# Patient Record
Sex: Female | Born: 1940 | Race: White | Hispanic: No | Marital: Married | Smoking: Never smoker
Health system: Southern US, Community
[De-identification: ages and names within clinical notes are randomized; demographics above are authoritative.]

## PROBLEM LIST (undated history)

## (undated) DIAGNOSIS — F419 Anxiety disorder, unspecified: Secondary | ICD-10-CM

## (undated) DIAGNOSIS — S82891A Other fracture of right lower leg, initial encounter for closed fracture: Secondary | ICD-10-CM

## (undated) DIAGNOSIS — F32A Depression, unspecified: Secondary | ICD-10-CM

## (undated) DIAGNOSIS — I509 Heart failure, unspecified: Secondary | ICD-10-CM

## (undated) DIAGNOSIS — A4902 Methicillin resistant Staphylococcus aureus infection, unspecified site: Secondary | ICD-10-CM

## (undated) DIAGNOSIS — I10 Essential (primary) hypertension: Secondary | ICD-10-CM

## (undated) DIAGNOSIS — N3281 Overactive bladder: Secondary | ICD-10-CM

## (undated) DIAGNOSIS — F329 Major depressive disorder, single episode, unspecified: Secondary | ICD-10-CM

## (undated) DIAGNOSIS — K219 Gastro-esophageal reflux disease without esophagitis: Secondary | ICD-10-CM

## (undated) HISTORY — PX: APPENDECTOMY: SHX54

## (undated) HISTORY — PX: NASAL SINUS SURGERY: SHX719

## (undated) HISTORY — PX: ABDOMINAL HYSTERECTOMY: SHX81

---
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
## 2005-09-28 ENCOUNTER — Emergency Department (HOSPITAL_COMMUNITY): Admission: EM | Admit: 2005-09-28 | Discharge: 2005-09-28 | Payer: Self-pay | Admitting: Emergency Medicine

## 2007-08-25 ENCOUNTER — Emergency Department (HOSPITAL_COMMUNITY): Admission: EM | Admit: 2007-08-25 | Discharge: 2007-08-25 | Payer: Self-pay | Admitting: Emergency Medicine

## 2008-03-16 ENCOUNTER — Ambulatory Visit (HOSPITAL_COMMUNITY): Admission: RE | Admit: 2008-03-16 | Discharge: 2008-03-16 | Payer: Self-pay | Admitting: Ophthalmology

## 2009-11-10 ENCOUNTER — Encounter: Payer: Self-pay | Admitting: Orthopedic Surgery

## 2009-11-10 ENCOUNTER — Emergency Department (HOSPITAL_COMMUNITY): Admission: EM | Admit: 2009-11-10 | Discharge: 2009-11-10 | Payer: Self-pay | Admitting: Emergency Medicine

## 2009-11-12 ENCOUNTER — Ambulatory Visit: Payer: Self-pay | Admitting: Orthopedic Surgery

## 2009-11-12 DIAGNOSIS — S52539A Colles' fracture of unspecified radius, initial encounter for closed fracture: Secondary | ICD-10-CM

## 2009-11-16 ENCOUNTER — Telehealth: Payer: Self-pay | Admitting: Orthopedic Surgery

## 2009-11-20 ENCOUNTER — Ambulatory Visit: Payer: Self-pay | Admitting: Orthopedic Surgery

## 2009-12-24 ENCOUNTER — Ambulatory Visit: Payer: Self-pay | Admitting: Orthopedic Surgery

## 2009-12-25 ENCOUNTER — Telehealth: Payer: Self-pay | Admitting: Orthopedic Surgery

## 2009-12-28 ENCOUNTER — Encounter: Admission: RE | Admit: 2009-12-28 | Discharge: 2009-12-28 | Payer: Self-pay | Admitting: Orthopedic Surgery

## 2009-12-31 ENCOUNTER — Encounter: Payer: Self-pay | Admitting: Orthopedic Surgery

## 2010-01-05 ENCOUNTER — Ambulatory Visit (HOSPITAL_COMMUNITY): Admission: RE | Admit: 2010-01-05 | Discharge: 2010-01-06 | Payer: Self-pay | Admitting: Orthopedic Surgery

## 2010-07-09 NOTE — Assessment & Plan Note (Signed)
Summary: RE-CK LT WRIST/FX CARE/?ARM CROOKED/STATES NO NEW INJURY/MEDI...   Visit Type:  Follow-up Referring Provider:  ap er Primary Provider:  Dr. Gerda Diss  CC:  recheck left wrist fx care.  History of Present Illness: I saw Jalaila Fomby in the office today for an initial visit.  She is a 70 years old woman with the complaint of:  left wrist fracture 11/10/09 DOI.  She fell wearing his both feet went out from under her and she fractured her LEFT wrist the impaction fracture with really no displacement but articular depression  She complains of sharp throbbing stabbing burning pain which is 10 out of 10 and it is constant and unrelieved by Percocet 5 mg and ibuprofen 800 mg.  She does have some tingling and some swelling.  Meds: Metoprolol, Nexium, Effexor, Premarin, HCTZ, Norco 7.5 and Phenergan.  Today is in for an early appt due to "her arm being crooked"  Patient in cast today.  Says her cast is crooked, says she is having alot of pain, Percocet 5 did not help and Norco 7.5 does not help.  Has multiple questions, wants to know if she needs therapy, can she use her hand, can she lift anything, can we xray through the cast, etc.  Mainly having alot of pain, wants higher dosage of medication         Allergies: No Known Drug Allergies   Impression & Recommendations:  Problem # 1:  COLLES' FRACTURE, LEFT (ICD-813.41) Assessment Comment Only  I tried to explain to her about 3. bending she essentially has a nondisplaced fracture with some vertical impaction at the joint surface.  I did increase her hydrocodone to 10 mg.  Return as scheduled x-rays as scheduled  Orders: Post-Op Check (78295)  Medications Added to Medication List This Visit: 1)  Norco 10-325 Mg Tabs (Hydrocodone-acetaminophen) .... One by mouth q 4 hrs as needed pain  Patient Instructions: 1)  keep appt Prescriptions: NORCO 10-325 MG TABS (HYDROCODONE-ACETAMINOPHEN) one by mouth q 4 hrs as needed  pain  #42 x 1   Entered and Authorized by:   Fuller Canada MD   Signed by:   Fuller Canada MD on 11/20/2009   Method used:   Print then Give to Patient   RxID:   [id number]

## 2010-07-09 NOTE — Progress Notes (Signed)
Summary: pts daughter called being ugly to office staff  Phone Note Other Incoming   Summary of Call: pateint's daughter called and was very ugly because patient does not have appt set up today to see DrMarland Kitchen Amanda Pea, advised her that we are working on referral, she said that her mother is old and should have appt today to see the specialist and she hung up on me.  Her referral has been faxed, waiting on Gram[facility] response Initial call taken by: Chasity Tereasa Coop,  December 25, 2009 10:12 AM

## 2010-07-09 NOTE — Progress Notes (Signed)
Summary: arm crooked?!  Phone Note Outgoing Call Call back at [facility] [phone]   Summary of Call: called patient back, she had called stating her arm is crooked per flag from [facility], I left message on machine to call me before 12 if possible, I advised her on the machine to go to er if she had fallen again to make her arm crooked. Initial call taken by: Ether Griffins,  November 16, 2009 9:12 AM  Follow-up for Phone Call        I dont understand this message ???? Follow-up by: Fuller Canada MD,  November 19, 2009 8:39 AM  Additional Follow-up for Phone Call Additional follow up Details #1::        I called patient to follow up; states arm seems okay; it was her daughter that had called w/concern; I've sched'd for appointment for re-check. Additional Follow-up by: Cammie Sickle,  November 19, 2009 10:02 AM

## 2010-07-09 NOTE — Letter (Signed)
Summary: History form  History form   Imported By: Jacklynn Ganong 11/15/2009 15:31:32  _____________________________________________________________________  External Attachment:    Type:   Image     Comment:   External Document

## 2010-07-09 NOTE — Assessment & Plan Note (Signed)
Summary: [facility] ER FX LEFT WRIST XR THERE/MEDICARE/BSF   Visit Type:  new patient Referring Provider:  ap er Primary Provider:  Dr. Gerda Diss  CC:  left wrist.  History of Present Illness: I saw Kari Young in the office today for an initial visit.  She is a 70 years old woman with the complaint of:  left wrist fracture.  She fell wearing his both feet went out from under her and she fractured her LEFT wrist the impaction fracture with really no displacement but articular depression  She complains of sharp throbbing stabbing burning pain which is 10 out of 10 and it is constant and unrelieved by Percocet 5 mg and ibuprofen 800 mg.  She does have some tingling and some swelling.  Meds: Metoprolol, Nexium, Effexor, Premarin, HCTZ, Percocet 5 given from er, no relief.      Allergies (verified): No Known Drug Allergies  Past History:  Past Medical History: htn reflux anxiety  Past Surgical History: na  Family History: na  Social History: Patient is married.  retired no smoking no alcohol no caffeine  Review of Systems Gastrointestinal:  Complains of nausea; denies heartburn, vomiting, diarrhea, constipation, and blood in your stools. Musculoskeletal:  Complains of joint pain and swelling; denies instability, stiffness, redness, heat, and muscle pain.  The review of systems is negative for Constitutional, Cardiovascular, Respiratory, Genitourinary, Neurologic, Endocrine, Psychiatric, Skin, HEENT, Immunology, and Hemoatologic.  Physical Exam  Additional Exam:  Constitutional: vital signs see recorded values. General: normal development, nutrition, and grooming. No deformity. Body Habitus is medium. CDV: Observation and palpation was normal  Lymph: palpation of the lymph nodes were normal Skin: inspection and palpation of the skin revealed no abnormalities  Neuro: coordination: deferred             DTR's deferred because of pain and swelling             Sensation was  normal  Psyche: Alert and oriented x 3. Mood was normal.  Affect: normal  MSK: Gait: normal   LEFT upper extremity exam tenderness and swelling of the distal radius with normal function of the fingers.  Range of motion painful.  Motor exam shows no contracture or tremor.  Wrist is stable.       Impression & Recommendations:  Problem # 1:  COLLES' FRACTURE, LEFT (ICD-813.41) Assessment New  The x-rays were done at [facility]. The report and the films have been reviewed. There is an impaction fracture with some comminution but overall alignment is normal there is mild minimal shortening minimal change in the articular surface angulation.  Short-arm cast 6 weeks change Percocet and try Norco 7.5 with Phenergan 25 and ibuprofen 800  Orders: New Patient Level III (16109) Distal Radius Fx (25600) the patient is advised to get a test for osteoporosis and she is advised she is at increased risk for hip fracture  Medications Added to Medication List This Visit: 1)  Norco 7.5-325 Mg Tabs (Hydrocodone-acetaminophen) .Marland Kitchen.. 1 q 4 as needed pain 2)  Promethazine Hcl 25 Mg Tabs (Promethazine hcl) .Marland Kitchen.. 1 q 4 as needed nausea  Patient Instructions: 1)  elevate your arm  2)  apply ice if there is swelling  3)  do not put anything in your cast to scratch  4)  Please do not get the cast wet. It will casue a severe skin reaction. If you do get it wet, dry it with a hairdryer on a low setting and call the office. [the cast will  need to be changed] 5)  6 weeks xrays OOP Prescriptions: PROMETHAZINE HCL 25 MG TABS (PROMETHAZINE HCL) 1 q 4 as needed nausea  #84 x 2   Entered and Authorized by:   Fuller Canada MD   Signed by:   Fuller Canada MD on 11/12/2009   Method used:   Print then Give to Patient   RxID:   [id number] NORCO 7.5-325 MG TABS (HYDROCODONE-ACETAMINOPHEN) 1 q 4 as needed pain  #84 x 2   Entered and Authorized by:   Fuller Canada MD   Signed by:   Fuller Canada MD on  11/12/2009   Method used:   Print then Give to Patient   RxID:   [id number]

## 2010-07-09 NOTE — Consult Note (Signed)
Summary: Consult note from Dr. Amanda Pea  Consult note from Dr. Amanda Pea   Imported By: Jacklynn Ganong 01/10/2010 08:06:44  _____________________________________________________________________  External Attachment:    Type:   Image     Comment:   External Document

## 2010-07-09 NOTE — Assessment & Plan Note (Signed)
Summary: 6 wk RE-CK/XRAYS LT WRIST/FX CARE/MEDICARE/CAF   Visit Type:  Follow-up Referring Provider:  ap er Primary Provider:  Dr. Gerda Diss  CC:  recheck fx care wrist.  History of Present Illness: I saw Kari Young in the office today for an initial visit.  She is a 70 years old woman with the complaint of:  left wrist fracture 11/10/09 DOI.  She fell wearing his both feet went out from under her and she fractured her LEFT wrist the impaction fracture with really no displacement but articular depression  Meds: Metoprolol, Nexium, Effexor, Premarin, HCTZ, Norco 10 and Phenergan.  Today is a 6 week recheck, xray left wrist OOP.  She still has pain, Norco 10 helps, has 4 left, needs refill.  I reexamined the x-rays today and took an extra x-ray and she has had  significant collapse of the fracture and will require open reduction,  distraction with bone graft and plating.  I will be  sending her to hand specialist for that.           Current Medications (verified): 1)  Norco 10-325 Mg Tabs (Hydrocodone-Acetaminophen) .... One By Mouth Q 4 Hrs As Needed Pain 2)  Promethazine Hcl 25 Mg Tabs (Promethazine Hcl) .Marland Kitchen.. 1 Q 4 As Needed Nausea  Allergies (verified): No Known Drug Allergies  Past History:  Past Medical History: Last updated: 11/12/2009 htn reflux anxiety  Past Surgical History: Last updated: 11/12/2009 na  Family History: Last updated: 11/12/2009 na  Social History: Last updated: 11/12/2009 Patient is married.  retired no smoking no alcohol no caffeine  Physical Exam  Additional Exam:  initial evaluation  Constitutional: vital signs see recorded values. General: normal development, nutrition, and grooming. No deformity. Body Habitus is medium. CDV: Observation and palpation was normal  Lymph: palpation of the lymph nodes were normal Skin: inspection and palpation of the skin revealed no abnormalities  Neuro: coordination: deferred   DTR's deferred because of pain and swelling             Sensation was normal  Psyche: Alert and oriented x 3. Mood was normal.  Affect: normal  MSK: Gait: normal   LEFT upper extremity exam tenderness and swelling of the distal radius with normal function of the fingers.  Range of motion painful.  Motor exam shows no contracture or tremor.  Wrist is stable.  Today's evaluation shows that she has a prominent ulna tenderness loss of supination with painful supination and tenderness at the fracture site  Elbow shoulder nontender no pain or swelling       Impression & Recommendations:  Problem # 1:  COLLES' FRACTURE, LEFT (ICD-813.41)  loss of position of Colles' fracture recommend referral to a hand specialist  Orders: Post-Op Check (42706) Wrist x-ray complete, minimum 3 views (23762) Orthopedic Surgeon Referral (Ortho Surgeon)  Patient Instructions: 1)  referral to Dr. Butler Denmark  for open treatment internal fixation bone grafting of LEFT distal radius fracture secondary to collapse and loss of position of Colles fracture  Prescriptions: NORCO 10-325 MG TABS (HYDROCODONE-ACETAMINOPHEN) one by mouth q 4 hrs as needed pain  #42 x 1   Entered and Authorized by:   Fuller Canada MD   Signed by:   Fuller Canada MD on 12/24/2009   Method used:   Print then Give to Patient   RxID:   [id number]

## 2010-07-09 NOTE — Progress Notes (Signed)
Summary: Referral to Dr. Butler Denmark.  Phone Note Outgoing Call   Call placed by: Waldon Reining,  December 25, 2009 10:16 AM Call placed to: Specialist Action Taken: Information Sent Summary of Call: I faxed a referral for this patient to Dr. Amanda Pea for a wrist fracture that needs surgery. Patient has films.

## 2010-08-24 LAB — DIFFERENTIAL
Basophils Absolute: 0 10*3/uL (ref 0.0–0.1)
Basophils Relative: 0 % (ref 0–1)
Eosinophils Relative: 1 % (ref 0–5)
Lymphs Abs: 1.1 10*3/uL (ref 0.7–4.0)
Monocytes Absolute: 0.5 10*3/uL (ref 0.1–1.0)
Neutrophils Relative %: 76 % (ref 43–77)

## 2010-08-24 LAB — SURGICAL PCR SCREEN: Staphylococcus aureus: NEGATIVE

## 2010-08-24 LAB — COMPREHENSIVE METABOLIC PANEL
ALT: 18 U/L (ref 0–35)
Alkaline Phosphatase: 47 U/L (ref 39–117)
BUN: 11 mg/dL (ref 6–23)
CO2: 33 mEq/L — ABNORMAL HIGH (ref 19–32)
Calcium: 9.4 mg/dL (ref 8.4–10.5)
Chloride: 93 mEq/L — ABNORMAL LOW (ref 96–112)
Creatinine, Ser: 0.71 mg/dL (ref 0.4–1.2)
GFR calc Af Amer: >60 mL/min (ref >60)
GFR calc non Af Amer: >60 mL/min (ref >60)
Glucose, Bld: 101 mg/dL — ABNORMAL HIGH (ref 70–99)
Total Bilirubin: 0.6 mg/dL (ref 0.3–1.2)
Total Protein: 7.7 g/dL (ref 6.0–8.3)

## 2010-08-24 LAB — CBC
MCH: 33.7 pg (ref 26.0–34.0)
MCHC: 34.9 g/dL (ref 30.0–36.0)
MCV: 96.6 fL (ref 78.0–100.0)
RBC: 4.64 MIL/uL (ref 3.87–5.11)

## 2011-03-03 LAB — COMPREHENSIVE METABOLIC PANEL
ALT: 16
AST: 20
Albumin: 3.7
Alkaline Phosphatase: 53
BUN: 11
CO2: 27
Calcium: 9.1
Chloride: 103
Creatinine, Ser: 0.63
GFR calc Af Amer: >60
GFR calc non Af Amer: >60
Glucose, Bld: 90
Potassium: 4.6
Sodium: 138
Total Bilirubin: 0.7
Total Protein: 6.5

## 2011-03-03 LAB — POCT CARDIAC MARKERS
CKMB, poc: <1 — ABNORMAL LOW
Myoglobin, poc: 41.2
Operator id: 146091
Troponin i, poc: <0.05

## 2011-03-03 LAB — URINALYSIS, ROUTINE W REFLEX MICROSCOPIC
Bilirubin Urine: NEGATIVE
Glucose, UA: NEGATIVE
Hgb urine dipstick: NEGATIVE
Ketones, ur: NEGATIVE
Nitrite: NEGATIVE
Protein, ur: NEGATIVE
Specific Gravity, Urine: 1.02
Urobilinogen, UA: 0.2
pH: 6.5

## 2011-03-03 LAB — DIFFERENTIAL
Basophils Absolute: 0
Basophils Relative: 0
Eosinophils Absolute: 0.1
Eosinophils Relative: 1
Lymphocytes Relative: 30
Lymphs Abs: 2.2
Monocytes Absolute: 0.4
Monocytes Relative: 5
Neutro Abs: 4.6
Neutrophils Relative %: 63

## 2011-03-03 LAB — CBC
HCT: 40.6
Hemoglobin: 13.9
MCHC: 34.2
MCV: 94.6
Platelets: 130 — ABNORMAL LOW
RBC: 4.29
RDW: 13.6
WBC: 7.2

## 2011-03-03 LAB — LIPASE, BLOOD: Lipase: 25

## 2011-03-11 LAB — BASIC METABOLIC PANEL
Calcium: 8.9
Chloride: 102
GFR calc Af Amer: >60
Glucose, Bld: 102 — ABNORMAL HIGH
Potassium: 4.4

## 2011-03-11 LAB — HEMOGLOBIN AND HEMATOCRIT, BLOOD: HCT: 41.7

## 2011-09-05 ENCOUNTER — Emergency Department (HOSPITAL_COMMUNITY)
Admission: EM | Admit: 2011-09-05 | Discharge: 2011-09-05 | Disposition: A | Discharge status: Home / Self Care | Payer: Medicare Other | Attending: Emergency Medicine | Admitting: Emergency Medicine

## 2011-09-05 ENCOUNTER — Emergency Department (HOSPITAL_COMMUNITY): Payer: Medicare Other

## 2011-09-05 ENCOUNTER — Encounter (HOSPITAL_COMMUNITY): Payer: Self-pay | Admitting: *Deleted

## 2011-09-05 DIAGNOSIS — R059 Cough, unspecified: Secondary | ICD-10-CM | POA: Insufficient documentation

## 2011-09-05 DIAGNOSIS — R079 Chest pain, unspecified: Secondary | ICD-10-CM | POA: Insufficient documentation

## 2011-09-05 DIAGNOSIS — D72829 Elevated white blood cell count, unspecified: Secondary | ICD-10-CM | POA: Insufficient documentation

## 2011-09-05 DIAGNOSIS — Z79899 Other long term (current) drug therapy: Secondary | ICD-10-CM | POA: Insufficient documentation

## 2011-09-05 DIAGNOSIS — R05 Cough: Secondary | ICD-10-CM | POA: Insufficient documentation

## 2011-09-05 HISTORY — DX: Methicillin resistant Staphylococcus aureus infection, unspecified site: A49.02

## 2011-09-05 HISTORY — DX: Essential (primary) hypertension: I10

## 2011-09-05 LAB — BASIC METABOLIC PANEL
BUN: 16 mg/dL (ref 6–23)
CO2: 30 mEq/L (ref 19–32)
Calcium: 10 mg/dL (ref 8.4–10.5)
Creatinine, Ser: 0.58 mg/dL (ref 0.50–1.10)
GFR calc Af Amer: >90 mL/min (ref >90)
GFR calc non Af Amer: >90 mL/min (ref >90)
Glucose, Bld: 87 mg/dL (ref 70–99)
Potassium: 4.7 mEq/L (ref 3.5–5.1)
Sodium: 139 mEq/L (ref 135–145)

## 2011-09-05 LAB — CBC
Hemoglobin: 15.6 g/dL — ABNORMAL HIGH (ref 12.0–15.0)
MCHC: 35.4 g/dL (ref 30.0–36.0)
MCV: 92.3 fL (ref 78.0–100.0)

## 2011-09-05 MED ORDER — HYDROCOD POLST-CHLORPHEN POLST 10-8 MG/5ML PO LQCR: 5.0000 mL | Freq: Two times a day (BID) | ORAL | Status: DC | PRN

## 2011-09-05 MED ORDER — MOXIFLOXACIN HCL 400 MG PO TABS: 400.0000 mg | ORAL_TABLET | Freq: Every day | ORAL | Status: AC

## 2011-09-05 NOTE — Discharge Instructions (Signed)
Cool Mist Vaporizers Vaporizers may help relieve the symptoms of a cough and cold. By adding water to the air, mucus may become thinner and less sticky. This makes it easier to breathe and cough up secretions. Vaporizers have not been proven to show they help with colds. You should not use a vaporizer if you are allergic to mold. Cool mist vaporizers do not cause serious burns like hot mist vaporizers ("steamers"). HOME CARE INSTRUCTIONS  Follow the package instructions for your vaporizer.   Use a vaporizer that holds a large volume of water (1 to 2 gallons [5.7 to 7.5 liters]).   Do not use anything other than distilled water in the vaporizer.   Do not run the vaporizer all of the time. This can cause mold or bacteria to grow in the vaporizer.   Clean the vaporizer after each time you use it.   Clean and dry the vaporizer well before you store it.   Stop using a vaporizer if you develop worsening respiratory symptoms.  Document Released: 02/21/2004 Document Revised: 05/15/2011 Document Reviewed: 01/18/2009 Exit[employer] Patient Information 2012 [employer]. 

## 2011-09-05 NOTE — ED Provider Notes (Signed)
History     CSN: [id number]  Arrival date & time 09/05/11  1519   First MD Initiated Contact with Patient 09/05/11 1736      Chief Complaint  Patient presents with  . Influenza    HPI Pt has been having cough, cold and flu like symptoms x2 weeks. Has been on antibiotics without relief. Pt has some sob with this.  Patient denies vomiting or diarrhea.  She does occasionally feel chills no documented fever.  Past Medical History  Diagnosis Date  . Hypertension   . MRSA infection     Past Surgical History  Procedure Date  . Appendectomy   . Abdominal hysterectomy   . Nasal sinus surgery     No family history on file.  History  Substance Use Topics  . Smoking status: Not on file  . Smokeless tobacco: Not on file  . Alcohol Use: No    OB History    Grav Para Term Preterm Abortions TAB SAB Ect Mult Living                  Review of Systems  All other systems reviewed and are negative.    Allergies  Review of patient's allergies indicates no known allergies.  Home Medications   Current Outpatient Rx  Name Route Sig Dispense Refill  . ALPRAZOLAM 1 MG PO TABS Oral Take 1 mg by mouth every 6 (six) hours as needed. For anxiety    . VITAMIN C PO Oral Take 1 tablet by mouth daily.    Marland Kitchen VITAMIN D-3 PO Oral Take 1 tablet by mouth daily.    Marland Kitchen ESOMEPRAZOLE MAGNESIUM 40 MG PO CPDR Oral Take 40 mg by mouth daily before breakfast.    . ESTROGENS CONJUGATED 1.25 MG PO TABS Oral Take 1.25 mg by mouth daily.    Marland Kitchen HYDROCHLOROTHIAZIDE 25 MG PO TABS Oral Take 25 mg by mouth daily.    Marland Kitchen METOPROLOL TARTRATE 25 MG PO TABS Oral Take 25 mg by mouth 2 (two) times daily.    Marland Kitchen MODAFINIL 200 MG PO TABS Oral Take 200 mg by mouth daily.    Carma Leaven M PLUS PO TABS Oral Take 1 tablet by mouth daily.    . VENLAFAXINE HCL ER 150 MG PO CP24 Oral Take 150 mg by mouth daily.    Marland Kitchen ZOLPIDEM TARTRATE 10 MG PO TABS Oral Take 10 mg by mouth at bedtime as needed. For sleep    . HYDROCOD POLST-CPM  POLST ER 10-8 MG/5ML PO LQCR Oral Take 5 mLs by mouth every 12 (twelve) hours as needed. 140 mL 0  . MOXIFLOXACIN HCL 400 MG PO TABS Oral Take 1 tablet (400 mg total) by mouth daily. 10 tablet 0    BP 150/77  Pulse 65  Temp(Src) 97.5 F (36.4 C) (Oral)  Resp 14  SpO2 98%  Physical Exam  Nursing note and vitals reviewed. Constitutional: She is oriented to person, place, and time. She appears well-developed and well-nourished. She does not appear ill. No distress.  HENT:  Head: Normocephalic and atraumatic.  Eyes: Pupils are equal, round, and reactive to light.  Neck: Normal range of motion.  Cardiovascular: Normal rate and intact distal pulses.   Pulmonary/Chest: No respiratory distress.  Abdominal: Normal appearance. She exhibits no distension.  Musculoskeletal: Normal range of motion.  Neurological: She is alert and oriented to person, place, and time. No cranial nerve deficit.  Skin: Skin is warm and dry. No rash noted.  Psychiatric:  She has a normal mood and affect. Her behavior is normal.    ED Course  Procedures (including critical care time)  Labs Reviewed  CBC - Abnormal; Notable for the following:    WBC 13.2 (*)    Hemoglobin 15.6 (*)    All other components within normal limits  BASIC METABOLIC PANEL   Dg Chest 2 View  09/05/2011  *RADIOLOGY REPORT*  Clinical Data: Influenza with mil to left chest pain since last evening.  Weakness with chills, cough and congestion.  Wheezing with shortness of breath for 2 weeks.  Controlled hypertension.  CHEST - 2 VIEW  Comparison: 01/03/2010  Findings: Heart and mediastinal contours are within normal limits. The lung fields appear clear with no signs of focal infiltrate or congestive failure.  No pleural fluid or significant peribronchial cuffing is seen.  Bony structures are notable for a healed fracture of the lateral aspect of the right fifth rib and stable anterior vertebral body height loss of the T11 vertebral body.   IMPRESSION: Stable cardiopulmonary appearance with no new focal or acute abnormality identified.  Original Report Authenticated By: Bertha Stakes, M.D.     1. Cough   2. Leukocytosis       MDM         Nelia Shi, MD 09/05/11 [phone]

## 2011-09-05 NOTE — ED Notes (Signed)
Pt has been having cough, cold and flu like symptoms x2 weeks.  Has been on antibiotics without relief.  Pt has some sob with this

## 2013-01-14 ENCOUNTER — Emergency Department (HOSPITAL_COMMUNITY)
Admission: EM | Admit: 2013-01-14 | Discharge: 2013-01-15 | Disposition: A | Discharge status: Home / Self Care | Payer: Medicare Other | Attending: Emergency Medicine | Admitting: Emergency Medicine

## 2013-01-14 ENCOUNTER — Emergency Department (HOSPITAL_COMMUNITY): Payer: Medicare Other

## 2013-01-14 ENCOUNTER — Encounter (HOSPITAL_COMMUNITY): Payer: Self-pay

## 2013-01-14 DIAGNOSIS — H538 Other visual disturbances: Secondary | ICD-10-CM | POA: Insufficient documentation

## 2013-01-14 DIAGNOSIS — R110 Nausea: Secondary | ICD-10-CM | POA: Insufficient documentation

## 2013-01-14 DIAGNOSIS — R51 Headache: Secondary | ICD-10-CM | POA: Insufficient documentation

## 2013-01-14 DIAGNOSIS — R079 Chest pain, unspecified: Secondary | ICD-10-CM

## 2013-01-14 DIAGNOSIS — M542 Cervicalgia: Secondary | ICD-10-CM

## 2013-01-14 DIAGNOSIS — R5381 Other malaise: Secondary | ICD-10-CM | POA: Insufficient documentation

## 2013-01-14 DIAGNOSIS — R519 Headache, unspecified: Secondary | ICD-10-CM

## 2013-01-14 DIAGNOSIS — R200 Anesthesia of skin: Secondary | ICD-10-CM

## 2013-01-14 DIAGNOSIS — R209 Unspecified disturbances of skin sensation: Secondary | ICD-10-CM | POA: Insufficient documentation

## 2013-01-14 DIAGNOSIS — R42 Dizziness and giddiness: Secondary | ICD-10-CM | POA: Insufficient documentation

## 2013-01-14 DIAGNOSIS — I10 Essential (primary) hypertension: Secondary | ICD-10-CM | POA: Insufficient documentation

## 2013-01-14 DIAGNOSIS — Z8614 Personal history of Methicillin resistant Staphylococcus aureus infection: Secondary | ICD-10-CM | POA: Insufficient documentation

## 2013-01-14 DIAGNOSIS — Z79899 Other long term (current) drug therapy: Secondary | ICD-10-CM | POA: Insufficient documentation

## 2013-01-14 LAB — BASIC METABOLIC PANEL
CO2: 30 mEq/L (ref 19–32)
GFR calc non Af Amer: 72 mL/min — ABNORMAL LOW (ref >90)
Glucose, Bld: 104 mg/dL — ABNORMAL HIGH (ref 70–99)
Potassium: 3.3 mEq/L — ABNORMAL LOW (ref 3.5–5.1)
Sodium: 138 mEq/L (ref 135–145)

## 2013-01-14 LAB — POCT I-STAT TROPONIN I: Troponin i, poc: 0 ng/mL (ref 0.00–0.08)

## 2013-01-14 LAB — CBC
Hemoglobin: 14.9 g/dL (ref 12.0–15.0)
RBC: 4.45 MIL/uL (ref 3.87–5.11)

## 2013-01-14 MED ORDER — MORPHINE SULFATE 4 MG/ML IJ SOLN
4.0000 mg | Freq: Once | INTRAMUSCULAR | Status: DC
  Filled 2013-01-14: qty 1

## 2013-01-14 MED ORDER — POTASSIUM CHLORIDE CRYS ER 20 MEQ PO TBCR
40.0000 meq | EXTENDED_RELEASE_TABLET | Freq: Once | ORAL | Status: AC
Start: 2013-01-15 — End: 2013-01-15
  Administered 2013-01-15: 40 meq via ORAL
  Filled 2013-01-14: qty 2

## 2013-01-14 MED ORDER — SODIUM CHLORIDE 0.9 % IV BOLUS (SEPSIS)
500.0000 mL | Freq: Once | INTRAVENOUS | Status: AC
  Administered 2013-01-14: 500 mL via INTRAVENOUS

## 2013-01-14 MED ORDER — IOHEXOL 350 MG/ML SOLN
50.0000 mL | Freq: Once | INTRAVENOUS | Status: AC | PRN
  Administered 2013-01-14: 50 mL via INTRAVENOUS

## 2013-01-14 NOTE — ED Notes (Signed)
MD at bedside. 

## 2013-01-14 NOTE — ED Provider Notes (Signed)
CSN: [id number]     Arrival date & time 01/14/13  2003 History     First MD Initiated Contact with Patient 01/14/13 2113     Chief Complaint  Patient presents with  . Headache  . Chest Pain   (Consider location/radiation/quality/duration/timing/severity/associated sxs/prior Treatment) HPI Comments: 72 yo female with no significant medical hx presents with posterior headache radiating to top of head, started around noon and is constant.  Different than previous, gradual onset.  Improved on arrival.  She also has left sided posterior neck pain with the headache and numbness down right arm intermittent for a few days.  On ROS she also has had a few brief episodes of anterior chest pain, non radiating, lasting 5 min, for the past 3 to 4 days last minutes at a time.  No cp in ED.    Patient is a 72 y.o. female presenting with headaches and chest pain. The history is provided by the patient and a relative.  Headache Associated symptoms: fatigue, nausea, neck pain and numbness   Associated symptoms: no abdominal pain, no back pain, no cough, no fever, no neck stiffness and no vomiting   Chest Pain Associated symptoms: fatigue, headache, nausea and numbness   Associated symptoms: no abdominal pain, no back pain, no cough, no diaphoresis, no fever, no shortness of breath, not vomiting and no weakness     Past Medical History  Diagnosis Date  . Hypertension   . MRSA infection    Past Surgical History  Procedure Laterality Date  . Appendectomy    . Abdominal hysterectomy    . Nasal sinus surgery     History reviewed. No pertinent family history. History  Substance Use Topics  . Smoking status: Not on file  . Smokeless tobacco: Not on file  . Alcohol Use: No   OB History   Grav Para Term Preterm Abortions TAB SAB Ect Mult Living                 Review of Systems  Constitutional: Positive for fatigue. Negative for fever, chills and diaphoresis.  HENT: Positive for neck pain.  Negative for neck stiffness.   Eyes: Positive for visual disturbance (blurry vision).  Respiratory: Negative for cough and shortness of breath.   Cardiovascular: Positive for chest pain.  Gastrointestinal: Positive for nausea. Negative for vomiting and abdominal pain.  Genitourinary: Negative for dysuria.  Musculoskeletal: Negative for back pain and gait problem.  Skin: Negative for rash.  Neurological: Positive for light-headedness, numbness and headaches. Negative for weakness.    Allergies  Review of patient's allergies indicates no known allergies.  Home Medications   Current Outpatient Rx  Name  Route  Sig  Dispense  Refill  . ALPRAZolam (XANAX) 1 MG tablet   Oral   Take 1 mg by mouth every 6 (six) hours as needed. For anxiety         . esomeprazole (NEXIUM) 40 MG capsule   Oral   Take 40 mg by mouth daily before breakfast.         . estrogens, conjugated, (PREMARIN) 1.25 MG tablet   Oral   Take 1.25 mg by mouth daily.         Marland Kitchen Fexofenadine-Pseudoephedrine (ALLEGRA-D 12 HOUR PO)   Oral   Take 1 capsule by mouth 2 (two) times daily.         . hydrochlorothiazide (HYDRODIURIL) 25 MG tablet   Oral   Take 25 mg by mouth daily.         Marland Kitchen  metoprolol (TOPROL-XL) 200 MG 24 hr tablet   Oral   Take 200 mg by mouth 2 (two) times daily.         . modafinil (PROVIGIL) 200 MG tablet   Oral   Take 200 mg by mouth every 7 (seven) days.          Marland Kitchen venlafaxine (EFFEXOR-XR) 150 MG 24 hr capsule   Oral   Take 150 mg by mouth daily.         Marland Kitchen zolpidem (AMBIEN) 10 MG tablet   Oral   Take 10 mg by mouth at bedtime as needed. For sleep          BP 137/93  Pulse 58  Temp(Src) 97.9 F (36.6 C) (Oral)  Resp 16  SpO2 96% Physical Exam  Nursing note and vitals reviewed. Constitutional: She is oriented to person, place, and time. She appears well-developed and well-nourished.  HENT:  Head: Normocephalic and atraumatic.  Eyes: Conjunctivae are normal. Right  eye exhibits no discharge. Left eye exhibits no discharge.  Neck: Normal range of motion. Neck supple. No tracheal deviation present.  Cardiovascular: Normal rate, regular rhythm and intact distal pulses.   No murmur heard. Pulmonary/Chest: Effort normal and breath sounds normal.  Abdominal: Soft. She exhibits no distension. There is no tenderness. There is no guarding.  Musculoskeletal: She exhibits no edema and no tenderness.  Neurological: She is alert and oriented to person, place, and time. No cranial nerve deficit. GCS eye subscore is 4. GCS verbal subscore is 5. GCS motor subscore is 6.  5+ strength in UE and LE with f/e at major joints. Sensation to palpation intact in UE and LE. CNs 2-12 grossly intact.  EOMFI.  PERRL.   Finger nose and coordination intact bilateral.   Visual fields intact to finger testing.   Skin: Skin is warm. No rash noted.  Psychiatric: She has a normal mood and affect.    ED Course   Procedures (including critical care time)  Labs Reviewed  CBC - Abnormal; Notable for the following:    MCHC 36.2 (*)    Platelets 139 (*)    All other components within normal limits  BASIC METABOLIC PANEL - Abnormal; Notable for the following:    Potassium 3.3 (*)    Glucose, Bld 104 (*)    GFR calc non Af Amer 72 (*)    GFR calc Af Amer 84 (*)    All other components within normal limits  PRO B NATRIURETIC PEPTIDE - Abnormal; Notable for the following:    Pro B Natriuretic peptide (BNP) 210.7 (*)    All other components within normal limits  POCT I-STAT TROPONIN I   No results found. No diagnosis found.  MDM  Pt has multiple different sxs and concerns.  Primarily she is concerned for neck pain/ headache and right arm numbness. CT angio head and neck done to rule out bleed/ stroke/ dissection.   Pain resolved on recheck, pt very well appearing.  Reviewed nursing notes and their reports were discussed with patient. Discussed screening cardiac work up for  intermittent cp.  Pt has not had cp in ED. I discussed at length with daughter in the room the chance that this could be two separate issues and some sxs may be due to her heart.  She has htn and age as risk factors.  She understands this may be her heart which could lead to MI but prefers to see her doctor on Monday to discuss  stress test and repeat bmp.  She will take daily asa until then.  EKG no acute findings, reviewed.   Date: 01/15/2013  Rate: 55  Rhythm: sinus bradycardia  QRS Axis: normal  Intervals: QT prolonged  ST/T Wave abnormalities: nonspecific ST changes and nonspecific T wave changes q waves lateral  Conduction Disutrbances:none  Narrative Interpretation:   Old EKG Reviewed: lateral st depressions improved.   DC with strict return instructions.    Enid Skeens, MD 01/15/13 [phone]

## 2013-01-14 NOTE — ED Notes (Addendum)
Pt also reports intermittent mid-sternum chest heaviness radiating into Left arm, SOB, dizziness, nausea, and diaphoresis. Pt reports 2 episodes today and 1 episode earlier this week

## 2013-01-14 NOTE — ED Notes (Addendum)
Pt c/o headache and nausea starting this am. Pt reports the pain starts in her posterior neck radiating up into the top of her head. Pt reports taking Ibuprofen, Alegra, and BC powder with no relief.

## 2013-01-14 NOTE — ED Notes (Signed)
Pt states she feels pressure in the back of patient head that radiates to top of head. Pt states this pain started today between 12 pm and 1 pm. Pt denies having any trauma to her head. Pain 2/10. Pt states 600 mg of Ibprofen and BC powder. Pt states that has helped. Pt states she has had this problem for 3 days now off and on. Pt alert and oriented reading a magazine. No Signs of distress noted. Pt states she also has been having on and off chest pain for 3 days.

## 2014-08-24 ENCOUNTER — Emergency Department (HOSPITAL_COMMUNITY)
Admission: EM | Admit: 2014-08-24 | Discharge: 2014-08-24 | Disposition: A | Discharge status: Home / Self Care | Payer: Medicare Other | Attending: Emergency Medicine | Admitting: Emergency Medicine

## 2014-08-24 ENCOUNTER — Emergency Department (HOSPITAL_COMMUNITY): Payer: Medicare Other

## 2014-08-24 DIAGNOSIS — G8929 Other chronic pain: Secondary | ICD-10-CM | POA: Insufficient documentation

## 2014-08-24 DIAGNOSIS — M1731 Unilateral post-traumatic osteoarthritis, right knee: Secondary | ICD-10-CM | POA: Diagnosis not present

## 2014-08-24 DIAGNOSIS — Z79899 Other long term (current) drug therapy: Secondary | ICD-10-CM | POA: Insufficient documentation

## 2014-08-24 DIAGNOSIS — Z8614 Personal history of Methicillin resistant Staphylococcus aureus infection: Secondary | ICD-10-CM | POA: Diagnosis not present

## 2014-08-24 DIAGNOSIS — I10 Essential (primary) hypertension: Secondary | ICD-10-CM | POA: Diagnosis not present

## 2014-08-24 DIAGNOSIS — M25561 Pain in right knee: Secondary | ICD-10-CM | POA: Diagnosis present

## 2014-08-24 NOTE — ED Notes (Signed)
Pt arrives via wheelchair from home with right knee pain and swelling for the last several days. Reports injury over a year ago and never had xrays. Seen by PMD Monday and given script for percocet 10mg . Pt states she just wants to find whats wrong.

## 2014-08-24 NOTE — Discharge Instructions (Signed)
Arthritis, Nonspecific °Arthritis is inflammation of a joint. This usually means pain, redness, warmth or swelling are present. One or more joints may be involved. There are a number of types of arthritis. Your caregiver may not be able to tell what type of arthritis you have right away. °CAUSES  °The most common cause of arthritis is the wear and tear on the joint (osteoarthritis). This causes damage to the cartilage, which can break down over time. The knees, hips, back and neck are most often affected by this type of arthritis. °Other types of arthritis and common causes of joint pain include: °· Sprains and other injuries near the joint. Sometimes minor sprains and injuries cause pain and swelling that develop hours later. °· Rheumatoid arthritis. This affects hands, feet and knees. It usually affects both sides of your body at the same time. It is often associated with chronic ailments, fever, weight loss and general weakness. °· Crystal arthritis. Gout and pseudo gout can cause occasional acute severe pain, redness and swelling in the foot, ankle, or knee. °· Infectious arthritis. Bacteria can get into a joint through a break in overlying skin. This can cause infection of the joint. Bacteria and viruses can also spread through the blood and affect your joints. °· Drug, infectious and allergy reactions. Sometimes joints can become mildly painful and slightly swollen with these types of illnesses. °SYMPTOMS  °· Pain is the main symptom. °· Your joint or joints can also be red, swollen and warm or hot to the touch. °· You may have a fever with certain types of arthritis, or even feel overall ill. °· The joint with arthritis will hurt with movement. Stiffness is present with some types of arthritis. °DIAGNOSIS  °Your caregiver will suspect arthritis based on your description of your symptoms and on your exam. Testing may be needed to find the type of arthritis: °· Blood and sometimes urine tests. °· X-ray tests  and sometimes CT or MRI scans. °· Removal of fluid from the joint (arthrocentesis) is done to check for bacteria, crystals or other causes. Your caregiver (or a specialist) will numb the area over the joint with a local anesthetic, and use a needle to remove joint fluid for examination. This procedure is only minimally uncomfortable. °· Even with these tests, your caregiver may not be able to tell what kind of arthritis you have. Consultation with a specialist (rheumatologist) may be helpful. °TREATMENT  °Your caregiver will discuss with you treatment specific to your type of arthritis. If the specific type cannot be determined, then the following general recommendations may apply. °Treatment of severe joint pain includes: °· Rest. °· Elevation. °· Anti-inflammatory medication (for example, ibuprofen) may be prescribed. Avoiding activities that cause increased pain. °· Only take over-the-counter or prescription medicines for pain and discomfort as recommended by your caregiver. °· Cold packs over an inflamed joint may be used for 10 to 15 minutes every hour. Hot packs sometimes feel better, but do not use overnight. Do not use hot packs if you are diabetic without your caregiver's permission. °· A cortisone shot into arthritic joints may help reduce pain and swelling. °· Any acute arthritis that gets worse over the next 1 to 2 days needs to be looked at to be sure there is no joint infection. °Long-term arthritis treatment involves modifying activities and lifestyle to reduce joint stress jarring. This can include weight loss. Also, exercise is needed to nourish the joint cartilage and remove waste. This helps keep the muscles   around the joint strong. °HOME CARE INSTRUCTIONS  °· Do not take aspirin to relieve pain if gout is suspected. This elevates uric acid levels. °· Only take over-the-counter or prescription medicines for pain, discomfort or fever as directed by your caregiver. °· Rest the joint as much as  possible. °· If your joint is swollen, keep it elevated. °· Use crutches if the painful joint is in your leg. °· Drinking plenty of fluids may help for certain types of arthritis. °· Follow your caregiver's dietary instructions. °· Try low-impact exercise such as: °¨ Swimming. °¨ Water aerobics. °¨ Biking. °¨ Walking. °· Morning stiffness is often relieved by a warm shower. °· Put your joints through regular range-of-motion. °SEEK MEDICAL CARE IF:  °· You do not feel better in 24 hours or are getting worse. °· You have side effects to medications, or are not getting better with treatment. °SEEK IMMEDIATE MEDICAL CARE IF:  °· You have a fever. °· You develop severe joint pain, swelling or redness. °· Many joints are involved and become painful and swollen. °· There is severe back pain and/or leg weakness. °· You have loss of bowel or bladder control. °Document Released: 07/03/2004 Document Revised: 08/18/2011 Document Reviewed: 07/19/2008 °[employer]® Patient Information ©2015 [employer]. This information is not intended to replace advice given to you by your health care provider. Make sure you discuss any questions you have with your health care provider. ° °

## 2014-08-24 NOTE — ED Provider Notes (Signed)
CSN: [id number]     Arrival date & time 08/24/14  1433 History  This chart was scribed for non-physician practitioner Teressa Lower, NP working with Arby Barrette, MD, by Tanda Rockers, ED Scribe. This patient was seen in room TR05C/TR05C and the patient's care was started at 2:58 PM.    Chief Complaint  Patient presents with  . Knee Pain   The history is provided by the patient. No language interpreter was used.     HPI Comments: Kari Young is a 74 y.o. female who presents to the Emergency Department complaining of chronic right knee pain that began "awhile ago". She reports that she fell onto the knee, causing the pain. Pt states her PCP told her to come to the ED to have an X Ray done. Per triage note, pt has had this pain for several years. She was seen by PCP on Monday, 3/14, and given a prescription for Percocet 10 mg. Pt denies any other symptoms.     Past Medical History  Diagnosis Date  . Hypertension   . MRSA infection    Past Surgical History  Procedure Laterality Date  . Appendectomy    . Abdominal hysterectomy    . Nasal sinus surgery     No family history on file. History  Substance Use Topics  . Smoking status: Not on file  . Smokeless tobacco: Not on file  . Alcohol Use: No   OB History    No data available     Review of Systems  Musculoskeletal: Positive for arthralgias (Right knee pain. ).  All other systems reviewed and are negative.     Allergies  Review of patient's allergies indicates no known allergies.  Home Medications   Prior to Admission medications   Medication Sig Start Date End Date Taking? Authorizing Provider  ALPRAZolam Prudy Feeler) 1 MG tablet Take 1 mg by mouth every 6 (six) hours as needed. For anxiety    Historical Provider, MD  esomeprazole (NEXIUM) 40 MG capsule Take 40 mg by mouth daily before breakfast.    Historical Provider, MD  estrogens, conjugated, (PREMARIN) 1.25 MG tablet Take 1.25 mg by mouth daily.     Historical Provider, MD  Fexofenadine-Pseudoephedrine (ALLEGRA-D 12 HOUR PO) Take 1 capsule by mouth 2 (two) times daily.    Historical Provider, MD  hydrochlorothiazide (HYDRODIURIL) 25 MG tablet Take 25 mg by mouth daily.    Historical Provider, MD  metoprolol (TOPROL-XL) 200 MG 24 hr tablet Take 200 mg by mouth 2 (two) times daily.    Historical Provider, MD  modafinil (PROVIGIL) 200 MG tablet Take 200 mg by mouth every 7 (seven) days.     Historical Provider, MD  venlafaxine (EFFEXOR-XR) 150 MG 24 hr capsule Take 150 mg by mouth daily.    Historical Provider, MD  zolpidem (AMBIEN) 10 MG tablet Take 10 mg by mouth at bedtime as needed. For sleep    Historical Provider, MD   Triage Vitals: BP 152/97 mmHg  Pulse 51  Temp(Src) 97.9 F (36.6 C) (Oral)  Resp 15  Ht  (1.6 m)  Wt 160 lb (72.576 kg)  BMI 28.35 kg/m2  SpO2 96%   Physical Exam  Constitutional: She is oriented to person, place, and time. She appears well-developed and well-nourished. No distress.  HENT:  Head: Normocephalic and atraumatic.  Eyes: Conjunctivae and EOM are normal.  Neck: Neck supple. No tracheal deviation present.  Cardiovascular: Normal rate.   Pulmonary/Chest: Effort normal. No respiratory distress.  Musculoskeletal: Normal range of motion.  Full rom of the right knee. Mild swelling noted. No redness or warmth noted  Neurological: She is alert and oriented to person, place, and time.  Skin: Skin is warm and dry.  Psychiatric: She has a normal mood and affect. Her behavior is normal.  Nursing note and vitals reviewed.   ED Course  Procedures (including critical care time)  DIAGNOSTIC STUDIES: Oxygen Saturation is 96% on RA, normal by my interpretation.    COORDINATION OF CARE: 3:01 PM-Discussed treatment plan which includes DG Right knee with pt at bedside and pt agreed to plan.   Labs Review Labs Reviewed - No data to display  Imaging Review Dg Knee Complete 4 Views Right  08/24/2014    CLINICAL DATA:  Initial evaluation right knee pain after falling in her driveway today  EXAM: RIGHT KNEE - COMPLETE 4+ VIEW  COMPARISON:  None.  FINDINGS: Calcification of the menisci. No fracture or dislocation. Mild narrowing and osteophyte formation of the medial compartment. No joint effusion.  IMPRESSION: No acute traumatic injury.  Mild degenerative changes.   Electronically Signed   By: Esperanza Heiraymond  Rubner M.D.   On: 08/24/2014 15:41     EKG Interpretation None      MDM   Final diagnoses:  Post-traumatic osteoarthritis of right knee   No acute injury noted. Pt given ortho follow up  I personally performed the services described in this documentation, which was scribed in my presence. The recorded information has been reviewed and is accurate.     Teressa LowerVrinda Jasmia Angst, NP 08/24/14 1551  Arby BarretteMarcy Pfeiffer, MD 08/26/14 1544

## 2015-05-31 ENCOUNTER — Encounter (HOSPITAL_COMMUNITY): Payer: Self-pay | Admitting: *Deleted

## 2015-05-31 ENCOUNTER — Emergency Department (HOSPITAL_COMMUNITY)
Admission: EM | Admit: 2015-05-31 | Discharge: 2015-05-31 | Disposition: A | Discharge status: Home / Self Care | Payer: Medicare Other | Attending: Emergency Medicine | Admitting: Emergency Medicine

## 2015-05-31 DIAGNOSIS — R197 Diarrhea, unspecified: Secondary | ICD-10-CM | POA: Diagnosis not present

## 2015-05-31 DIAGNOSIS — Z8614 Personal history of Methicillin resistant Staphylococcus aureus infection: Secondary | ICD-10-CM | POA: Insufficient documentation

## 2015-05-31 DIAGNOSIS — I10 Essential (primary) hypertension: Secondary | ICD-10-CM | POA: Diagnosis not present

## 2015-05-31 DIAGNOSIS — Z79899 Other long term (current) drug therapy: Secondary | ICD-10-CM | POA: Diagnosis not present

## 2015-05-31 DIAGNOSIS — R112 Nausea with vomiting, unspecified: Secondary | ICD-10-CM | POA: Insufficient documentation

## 2015-05-31 LAB — COMPREHENSIVE METABOLIC PANEL
ALBUMIN: 3.9 g/dL (ref 3.5–5.0)
ALT: 15 U/L (ref 14–54)
ANION GAP: 10 (ref 5–15)
AST: 23 U/L (ref 15–41)
Alkaline Phosphatase: 46 U/L (ref 38–126)
BILIRUBIN TOTAL: 0.5 mg/dL (ref 0.3–1.2)
BUN: 11 mg/dL (ref 6–20)
CO2: 27 mmol/L (ref 22–32)
Calcium: 9 mg/dL (ref 8.9–10.3)
Chloride: 101 mmol/L (ref 101–111)
Creatinine, Ser: 0.59 mg/dL (ref 0.44–1.00)
GFR calc Af Amer: >60 mL/min (ref >60)
GFR calc non Af Amer: >60 mL/min (ref >60)
GLUCOSE: 107 mg/dL — AB (ref 65–99)
POTASSIUM: 3.9 mmol/L (ref 3.5–5.1)
SODIUM: 138 mmol/L (ref 135–145)
TOTAL PROTEIN: 7.1 g/dL (ref 6.5–8.1)

## 2015-05-31 LAB — CBC WITH DIFFERENTIAL/PLATELET
BASOS PCT: 0 %
Basophils Absolute: 0 10*3/uL (ref 0.0–0.1)
EOS ABS: 0.1 10*3/uL (ref 0.0–0.7)
Eosinophils Relative: 1 %
HCT: 42.3 % (ref 36.0–46.0)
Hemoglobin: 14.3 g/dL (ref 12.0–15.0)
Lymphocytes Relative: 30 %
Lymphs Abs: 1.8 10*3/uL (ref 0.7–4.0)
MCH: 32.4 pg (ref 26.0–34.0)
MCHC: 33.8 g/dL (ref 30.0–36.0)
MCV: 95.9 fL (ref 78.0–100.0)
MONO ABS: 0.2 10*3/uL (ref 0.1–1.0)
MONOS PCT: 4 %
Neutro Abs: 3.9 10*3/uL (ref 1.7–7.7)
Neutrophils Relative %: 65 %
PLATELETS: 121 10*3/uL — AB (ref 150–400)
RBC: 4.41 MIL/uL (ref 3.87–5.11)
RDW: 13.7 % (ref 11.5–15.5)
WBC: 6 10*3/uL (ref 4.0–10.5)

## 2015-05-31 LAB — LIPASE, BLOOD: Lipase: 17 U/L (ref 11–51)

## 2015-05-31 MED ORDER — PROMETHAZINE HCL 25 MG/ML IJ SOLN
12.5000 mg | Freq: Once | INTRAMUSCULAR | Status: AC
  Administered 2015-05-31: 12.5 mg via INTRAVENOUS
  Filled 2015-05-31: qty 1

## 2015-05-31 MED ORDER — SODIUM CHLORIDE 0.9 % IV BOLUS (SEPSIS)
500.0000 mL | Freq: Once | INTRAVENOUS | Status: AC
  Administered 2015-05-31: 500 mL via INTRAVENOUS

## 2015-05-31 MED ORDER — PROMETHAZINE HCL 25 MG PO TABS: 12.5000 mg | ORAL_TABLET | Freq: Four times a day (QID) | ORAL | Status: DC | PRN

## 2015-05-31 NOTE — ED Provider Notes (Signed)
CSN: [id number]     Arrival date & time 05/31/15  1850 History   First MD Initiated Contact with Patient 05/31/15 1902     Chief Complaint  Patient presents with  . Emesis     (Consider location/radiation/quality/duration/timing/severity/associated sxs/prior Treatment) Patient is a 74 y.o. female presenting with vomiting. The history is provided by the patient.  Emesis Severity:  Moderate Associated symptoms: abdominal pain and diarrhea   Associated symptoms: no headaches    patient states that she has been vomiting and having diarrhea all day. Mild abdominal pain. Occasional cough. States her primary care doctor is just going to try call some in for. States that she needs Phenergan and then she will feel better. She is eager to get home. No dysuria. States that her son has had nausea vomiting and diarrhea also.  Past Medical History  Diagnosis Date  . Hypertension   . MRSA infection    Past Surgical History  Procedure Laterality Date  . Appendectomy    . Abdominal hysterectomy    . Nasal sinus surgery     No family history on file. Social History  Substance Use Topics  . Smoking status: Never Smoker   . Smokeless tobacco: None  . Alcohol Use: No   OB History    No data available     Review of Systems  Constitutional: Negative for activity change and appetite change.  Eyes: Negative for pain.  Respiratory: Negative for chest tightness and shortness of breath.   Cardiovascular: Negative for chest pain and leg swelling.  Gastrointestinal: Positive for nausea, vomiting, abdominal pain and diarrhea.  Genitourinary: Negative for flank pain.  Musculoskeletal: Negative for back pain and neck stiffness.  Skin: Negative for rash.  Neurological: Negative for weakness, numbness and headaches.  Psychiatric/Behavioral: Negative for behavioral problems.      Allergies  Percocet and Sulfamethoxazole  Home Medications   Prior to Admission medications   Medication Sig  Start Date End Date Taking? Authorizing Provider  ALPRAZolam Prudy Feeler) 1 MG tablet Take 1 mg by mouth every 6 (six) hours as needed. For anxiety   Yes Historical Provider, MD  Cholecalciferol 1000 UNITS tablet Take 1,000 Units by mouth daily.   Yes Historical Provider, MD  diazepam (VALIUM) 10 MG tablet Take 5 mg by mouth every 8 (eight) hours as needed for anxiety.  01/19/12  Yes Historical Provider, MD  esomeprazole (NEXIUM) 40 MG capsule Take 40 mg by mouth daily before breakfast.   Yes Historical Provider, MD  estrogens, conjugated, (PREMARIN) 1.25 MG tablet Take 1.25 mg by mouth daily.   Yes Historical Provider, MD  Fexofenadine-Pseudoephedrine (ALLEGRA-D 12 HOUR PO) Take 1 capsule by mouth 2 (two) times daily.   Yes Historical Provider, MD  furosemide (LASIX) 20 MG tablet Take 20 mg by mouth daily.   Yes Historical Provider, MD  hydrochlorothiazide (HYDRODIURIL) 25 MG tablet Take 25 mg by mouth daily.   Yes Historical Provider, MD  magnesium oxide (MAGOX 400) 400 (241.3 MG) MG tablet Take 400 mg by mouth 2 (two) times daily. 05/30/15  Yes Historical Provider, MD  meclizine (ANTIVERT) 25 MG tablet Take 25 mg by mouth 3 (three) times daily as needed. 01/12/15 01/12/16 Yes Historical Provider, MD  metoprolol (TOPROL-XL) 200 MG 24 hr tablet Take 200 mg by mouth 2 (two) times daily.   Yes Historical Provider, MD  modafinil (PROVIGIL) 200 MG tablet Take 200 mg by mouth daily.    Yes Historical Provider, MD  potassium chloride (K-DUR,KLOR-CON) 10  MEQ tablet Take 10 mEq by mouth 2 (two) times daily. 01/28/14  Yes Historical Provider, MD  venlafaxine (EFFEXOR-XR) 150 MG 24 hr capsule Take 150 mg by mouth daily.   Yes Historical Provider, MD  zolpidem (AMBIEN) 10 MG tablet Take 10 mg by mouth at bedtime as needed. For sleep   Yes Historical Provider, MD  promethazine (PHENERGAN) 25 MG tablet Take 0.5 tablets (12.5 mg total) by mouth every 6 (six) hours as needed for nausea. 05/31/15   Benjiman CoreNathan Sweet Jarvis, MD   BP  165/111 mmHg  Pulse 57  Temp(Src) 97.9 F (36.6 C) (Oral)  Resp 18  Ht 5\' 3"  (1.6 m)  Wt 160 lb (72.576 kg)  BMI 28.35 kg/m2  SpO2 98% Physical Exam  Constitutional: She is oriented to person, place, and time. She appears well-developed and well-nourished.  HENT:  Head: Normocephalic and atraumatic.  Eyes: Pupils are equal, round, and reactive to light.  Cardiovascular: Normal rate and regular rhythm.   Pulmonary/Chest: Effort normal and breath sounds normal. No respiratory distress. She has no wheezes.  Abdominal: Soft. She exhibits no distension. There is no tenderness. There is no rebound and no guarding.  Musculoskeletal: Normal range of motion.  Neurological: She is alert and oriented to person, place, and time.  Skin: Skin is warm and dry.  Psychiatric: She has a normal mood and affect. Her speech is normal.  Nursing note and vitals reviewed.   ED Course  Procedures (including critical care time) Labs Review Labs Reviewed  CBC WITH DIFFERENTIAL/PLATELET - Abnormal; Notable for the following:    Platelets 121 (*)    All other components within normal limits  COMPREHENSIVE METABOLIC PANEL - Abnormal; Notable for the following:    Glucose, Bld 107 (*)    All other components within normal limits  LIPASE, BLOOD    Imaging Review No results found. I have personally reviewed and evaluated these images and lab results as part of my medical decision-making.   EKG Interpretation None      MDM   Final diagnoses:  Nausea vomiting and diarrhea    Patient nausea vomiting diarrhea. Lab work reassuring. Feels better after treatment is tolerated orals. Will discharge home.    Benjiman CoreNathan Tamani Durney, MD 05/31/15 2134

## 2015-05-31 NOTE — ED Notes (Signed)
Pt states vomiting began when she woke up this morning. Pt states "sore from vomiting". Denies any other pain at this time.

## 2015-05-31 NOTE — Discharge Instructions (Signed)

## 2015-05-31 NOTE — ED Notes (Signed)
Patient verbalizes understanding of discharge instructions, prescription medications, home care and follow up care if needed. Patient out of department at this time with family. 

## 2015-10-04 ENCOUNTER — Ambulatory Visit: Payer: Medicare Other | Admitting: Physical Therapy

## 2015-10-05 ENCOUNTER — Ambulatory Visit: Payer: Medicare Other | Admitting: Physical Therapy

## 2015-10-08 ENCOUNTER — Ambulatory Visit: Payer: Medicare Other | Attending: Adult Reconstructive Orthopaedic Surgery | Admitting: Physical Therapy

## 2015-10-08 ENCOUNTER — Encounter: Payer: Self-pay | Admitting: Physical Therapy

## 2015-10-08 DIAGNOSIS — M25662 Stiffness of left knee, not elsewhere classified: Secondary | ICD-10-CM | POA: Diagnosis present

## 2015-10-08 DIAGNOSIS — M25562 Pain in left knee: Secondary | ICD-10-CM | POA: Insufficient documentation

## 2015-10-08 DIAGNOSIS — R600 Localized edema: Secondary | ICD-10-CM | POA: Insufficient documentation

## 2015-10-08 NOTE — Therapy (Signed)
[facility] [address] [place], [place], [address] Phone: [phone]   Fax:  [phone]  Physical Therapy Evaluation  Patient Details  Name: Kari Young MRN: [id number] Date of Birth: [birth year] Referring Provider: Hal HopeJohn Shepherd Shields MD  Encounter Date: 10/08/2015      PT End of Session - 10/08/15 1430    Visit Number 1   Number of Visits 18   Date for PT Re-Evaluation 11/19/15   PT Start Time 1355   PT Stop Time 1444   PT Time Calculation (min) 49 min   Activity Tolerance Patient tolerated treatment well   Behavior [facility] Therapy [facility] for tasks assessed/performed      Past Medical History  Diagnosis Date  . Hypertension   . MRSA infection     Past Surgical History  Procedure Laterality Date  . Appendectomy    . Abdominal hysterectomy    . Nasal sinus surgery      There were no vitals filed for this visit.       Subjective Assessment - 10/08/15 1352    Subjective Patient had R TKR on 10/02/15.   Patient is accompained by: Family member   Pertinent History HTN, CHF, bladder infection   Patient Stated Goals Get back to the old [name]   Currently in Pain? Yes   Pain Score 7    Pain Location Knee   Pain Orientation Right   Pain Descriptors / Indicators Sharp;Aching   Pain Type Surgical pain   Pain Onset 1 to 4 weeks ago   Pain Frequency Constant   Aggravating Factors  bending the knee   Pain Relieving Factors meds, ice            [facility] PT Assessment - 10/08/15 0001    Assessment   Medical Diagnosis R knee pain; primary OA of knee   Referring Provider Hal HopeJohn Shepherd Shields MD   Onset Date/Surgical Date 10/02/15   Next MD Visit 10/10/15   Prior Therapy no   Precautions   Precautions Fall   Precaution Comments FALL RISK   Restrictions   Weight Bearing Restrictions No   Balance Screen   Has the patient fallen in the past 6 months Yes   How many times? 10+   Has the patient had a decrease in  activity level because of a fear of falling?  Yes   Is the patient reluctant to leave their home because of a fear of falling?  Yes   Home Environment   Living Environment Private residence   Type of Home House   Home Access Stairs to enter   Entrance Stairs-Number of Steps 2   Entrance Stairs-Rails None   Home Equipment DerbyWalker - 2 wheels;Cane - quad;Cane - single point   Observation/Other Assessments-Edema    Edema Circumferential  45 cm L; 49 cm R    ROM / Strength   AROM / PROM / Strength AROM;PROM;Strength   AROM   AROM Assessment Site Knee   Right/Left Knee Right   Right Knee Extension -10   Right Knee Flexion 79   PROM   PROM Assessment Site Knee   Right/Left Knee Right   Right Knee Extension 0   Right Knee Flexion 92   Strength   Overall Strength Comments R knee ext 5/5, flex 4/5, hip flex 5/5, ABD and ext 5/5                   [facility] Adult PT Treatment/Exercise - 10/08/15  0001    Modalities   Modalities Electrical Stimulation   Electrical Stimulation   Electrical Stimulation Location R knee IFC 1-10 Hz to tolerance x 15 min   Electrical Stimulation Goals Edema                PT Education - 10/23/15 1531    Education provided Yes   Education Details HEP; self care regarding importance of walking/moving to avoid blood clots; positioning for maximum benefit/ walker adjustment; stair education (good up/bad down) with nephew.   Person(s) Educated Patient;Child(ren)   Methods Explanation;Demonstration;Handout   Comprehension Verbalized understanding;Returned demonstration             PT Long Term Goals - Oct 23, 2015 1541    PT LONG TERM GOAL #1   Title I with HEP   Time 6   Period Weeks   Status New   PT LONG TERM GOAL #2   Title Improved R knee ROM 0-120 degrees to normalize function.   Time 6   Period Weeks   Status New   PT LONG TERM GOAL #3   Title Able to amb 500 feet in clinic safely without AD   Time 6   Period Weeks   Status  New   PT LONG TERM GOAL #4   Title Able to climb 2  stairs with reciprocal gait and no UE support   Time 6   Period Weeks   Status New   PT LONG TERM GOAL #5   Title Decreased edema to within  1.5 cm of L knee   Time 6   Period Weeks   Status New               Plan - Oct 23, 2015 1532    Clinical Impression Statement Patient presents s/p R TKR. She amb with RW and decreased stance time on the RLE. Walker was adjusted to correct height. Sit <> stand required VC's for correct use of hands. She is progressing well with ROM.   Rehab Potential Excellent   PT Frequency 3x / week   PT Duration 6 weeks   PT Treatment/Interventions ADLs/Self Care Home Management;Cryotherapy;Electrical Stimulation;Therapeutic exercise;Stair training;Gait training;Balance training;Neuromuscular re-education;Patient/family education;Manual techniques;Vasopneumatic Device;Passive range of motion;Scar mobilization   PT Next Visit Plan TKA protocol (surgery 10/02/15); modalities for pain and edema   PT Home Exercise Plan TKR exercises: QS, heel slides, LAQ; walking, elevating, ankle pumps   Consulted and Agree with Plan of Care Patient      Patient will benefit from skilled therapeutic intervention in order to improve the following deficits and impairments:  Abnormal gait, Decreased range of motion, Pain, Decreased activity tolerance, Decreased balance, Increased edema, Decreased strength  Visit Diagnosis: Pain in left knee - Plan: PT plan of care cert/re-cert  Stiffness of left knee, not elsewhere classified - Plan: PT plan of care cert/re-cert  Localized edema - Plan: PT plan of care cert/re-cert      G-Codes - 10-23-15 1545    Functional Assessment Tool Used Foto = 68% limited   Functional Limitation Mobility: Walking and moving around   Mobility: Walking and Moving Around Current Status [phone]) At least 60 percent but less than 80 percent impaired, limited or restricted   Mobility: Walking and Moving  Around Goal Status [phone]) At least 40 percent but less than 60 percent impaired, limited or restricted       Problem List Patient Active Problem List   Diagnosis Date Noted  . COLLES' FRACTURE, LEFT 11/12/2009  Solon Palm PT  10/08/2015, 3:53 PM  [facility] [address] [place], [place], [address] Phone: [phone]   Fax:  [phone]  Name: Kari Young MRN: [id number] Date of Birth: 1941/02/18

## 2015-10-08 NOTE — Patient Instructions (Signed)
Chair Knee Flexion   Keeping feet on floor, slide foot of operated leg back, bending knee. Hold __20-30__ seconds. Repeat 5____ times. Do __4__ sessions a day.  Heel Slide   Bend left knee and pull heel toward buttocks. Use strap around foot and pull strap with arms to assist knee to bend further. Hold 10 secs.  Repeat _20___ times. Do _4___ sessions per day.   POSITIONING :  Elevate leg above heart, with knee resting into extension (straight).  Place pillow under ankle, never under knee.     Ankle Pump  Bend ankles up and down, alternating feet. Repeat _30___ times. Do _5___ sessions per day.  Quad Set  With other leg bent, foot flat, slowly tighten muscles on thigh of straight leg while counting out loud to 10____. Repeat with other leg. Repeat _10___ times. Do _4___ sessions per day.  Hip Flexion / Knee Extension: Straight-Leg Raise (Eccentric)  Lie on back. Lift leg with knee straight. Slowly lower leg for 3-5 seconds. 10___ reps per set, _1-3__ sets. 2 x per day.    KNEE: Extension, Long Arc Quads - Sitting   Raise leg until knee is straight. _10__ reps per set, _1-3__ sets. 4x per day.  WALK EVERY HOUR FOR 5 MINUTES OR LONGER.  PROP YOUR FOOT ON A CHAIR TO STRETCH THE BACK OF YOUR KNEE.  Solon PalmJulie Wille Aubuchon, PT 10/08/2015 2:39 PM [facility] [address] [place], [place], [address] Phone: [phone]   Fax:  [phone]

## 2015-10-09 ENCOUNTER — Ambulatory Visit: Payer: Medicare Other | Admitting: Physical Therapy

## 2015-10-11 ENCOUNTER — Ambulatory Visit: Payer: Medicare Other | Admitting: Physical Therapy

## 2015-10-11 DIAGNOSIS — M25562 Pain in left knee: Secondary | ICD-10-CM

## 2015-10-11 DIAGNOSIS — R600 Localized edema: Secondary | ICD-10-CM

## 2015-10-11 DIAGNOSIS — M25662 Stiffness of left knee, not elsewhere classified: Secondary | ICD-10-CM

## 2015-10-11 NOTE — Therapy (Signed)
[facility] [address] [place], [place], [address] Phone: [phone]   Fax:  [phone]  Physical Therapy Treatment  Patient Details  Name: Kari Young MRN: [id number] Date of Birth: [birth year] Referring Provider: Hal Hope MD  Encounter Date: 10/11/2015      PT End of Session - 10/11/15 1354    Visit Number 2   Number of Visits 18   Date for PT Re-Evaluation 11/19/15   PT Start Time 1348   PT Stop Time 1430   PT Time Calculation (min) 42 min   Activity Tolerance Patient tolerated treatment well   Behavior During Therapy [facility] for tasks assessed/performed      Past Medical History  Diagnosis Date  . Hypertension   . MRSA infection     Past Surgical History  Procedure Laterality Date  . Appendectomy    . Abdominal hysterectomy    . Nasal sinus surgery      There were no vitals filed for this visit.      Subjective Assessment - 10/11/15 1355    Subjective Patient had R TKR on 10/02/15. Patient states pain is significantly better. Just a twitch once and awhile.   Patient is accompained by: Family member   Pertinent History HTN, CHF, bladder infection   Patient Stated Goals Get back to the old [name]   Currently in Pain? No/denies                         [facility] Adult PT Treatment/Exercise - 10/11/15 0001    Ambulation/Gait   Ambulation/Gait Yes   Ambulation/Gait Assistance 6: Modified independent (Device/Increase time)   Ambulation Distance (Feet) 15 Feet   Assistive device Small based quad cane   Ambulation Surface Level   Gait Comments patient requires VCs to amb with 2 and 3 point gait pattern.    Exercises   Exercises Knee/Hip   Knee/Hip Exercises: Aerobic   Nustep L3 x 10 min  Seat 8    Knee/Hip Exercises: Standing   Lateral Step Up 2 sets;10 reps;Hand Hold: 2;Step Height: 4"   Lateral Step Up Limitations increase step ht   Forward Step Up Right;2 sets;10 reps;Hand  Hold: 2;Step Height: 4"   Forward Step Up Limitations increase step ht   Rocker Board 3 minutes  for ankle stretch and balance   Knee/Hip Exercises: Seated   Long Arc Quad Strengthening;Right;3 sets;10 reps   Long Arc Quad Weight 3 lbs.   Knee/Hip Exercises: Supine   Heel Slides Strengthening;Right;20 reps   Straight Leg Raises Strengthening;20 reps   Knee/Hip Exercises: Sidelying   Hip ABduction Strengthening;Right;2 sets;10 reps                     PT Long Term Goals - 10/08/15 1541    PT LONG TERM GOAL #1   Title I with HEP   Time 6   Period Weeks   Status New   PT LONG TERM GOAL #2   Title Improved R knee ROM 0-120 degrees to normalize function.   Time 6   Period Weeks   Status New   PT LONG TERM GOAL #3   Title Able to amb 500 feet in clinic safely without AD   Time 6   Period Weeks   Status New   PT LONG TERM GOAL #4   Title Able to climb 2  stairs with reciprocal gait and no UE support   Time  6   Period Weeks   Status New   PT LONG TERM GOAL #5   Title Decreased edema to within  1.5 cm of L knee   Time 6   Period Weeks   Status New               Plan - 10/11/15 1546    Clinical Impression Statement Patient did extremely well with therapy today. She was much more confident today and reported no pain except occasional twinge. Estim was held today as patient's pants were to restrictive to expose knee.   PT Next Visit Plan TKA protocol (surgery 10/02/15); modalities for pain and edema; gait       Patient will benefit from skilled therapeutic intervention in order to improve the following deficits and impairments:  Abnormal gait, Decreased range of motion, Pain, Decreased activity tolerance, Decreased balance, Increased edema, Decreased strength  Visit Diagnosis: Pain in left knee  Stiffness of left knee, not elsewhere classified  Localized edema     Problem List Patient Active Problem List   Diagnosis Date Noted  . COLLES'  FRACTURE, LEFT 11/12/2009    Solon PalmJulie Robert Sunga PT  10/11/2015, 3:54 PM  [facility] [address] [place], [place], [address] Phone: [phone]   Fax:  [phone]  Name: Kari Young MRN: [id number] Date of Birth: 1940-06-13

## 2015-10-16 ENCOUNTER — Encounter: Payer: Medicare Other | Admitting: Physical Therapy

## 2015-10-18 ENCOUNTER — Ambulatory Visit: Payer: Medicare Other | Admitting: Physical Therapy

## 2015-10-18 DIAGNOSIS — M25562 Pain in left knee: Secondary | ICD-10-CM

## 2015-10-18 DIAGNOSIS — M25662 Stiffness of left knee, not elsewhere classified: Secondary | ICD-10-CM

## 2015-10-18 DIAGNOSIS — R600 Localized edema: Secondary | ICD-10-CM

## 2015-10-18 NOTE — Therapy (Signed)
[facility] [address] [place], [place], [address] Phone: [phone]   Fax:  [phone]  Physical Therapy Treatment  Patient Details  Name: Kari Young MRN: [id number] Date of Birth: November 20, 1940 Referring Provider: Hal HopeJohn Shepherd Shields MD  Encounter Date: 10/18/2015      PT End of Session - 10/18/15 1904    Visit Number 4   Number of Visits 18   Date for PT Re-Evaluation 11/19/15   PT Start Time 0145   PT Stop Time 0247   PT Time Calculation (min) 62 min   Activity Tolerance Patient tolerated treatment well   Behavior During Therapy [facility] for tasks assessed/performed      Past Medical History  Diagnosis Date  . Hypertension   . MRSA infection     Past Surgical History  Procedure Laterality Date  . Appendectomy    . Abdominal hysterectomy    . Nasal sinus surgery      There were no vitals filed for this visit.      Subjective Assessment - 10/18/15 1501    Subjective I'm doing better everyday.   Patient Stated Goals Get back to the old [name]   Pain Score 4    Pain Location Knee   Pain Orientation Right   Pain Descriptors / Indicators Aching;Sharp   Pain Type Surgical pain   Pain Onset 1 to 4 weeks ago                         [facility] Treatment/Exercise - 10/18/15 0001    Exercises   Exercises Knee/Hip   Knee/Hip Exercises: Aerobic   Nustep Level x 3 minutes.   Knee/Hip Exercises: Standing   Rocker Board Limitations 5 minutes.   Modalities   Modalities Office managerlectrical Stimulation;Vasopneumatic   Electrical Stimulation   Electrical Stimulation Location RT knee.   Electrical Stimulation Action IFC   Electrical Stimulation Parameters 1-10 HZ at 100% scan x 15 minutes.   Electrical Stimulation Goals Edema;Pain   Vasopneumatic   Number Minutes Vasopneumatic  15 minutes   Vasopnuematic Location  --  Right knee.   Vasopneumatic Pressure Medium   Manual Therapy   Manual therapy  comments In supine right knee PROM (low load long duration stretching) intto right knee flexion and extension x 18 minutes.                     PT Long Term Goals - 10/08/15 1541    PT LONG TERM GOAL #1   Title I with HEP   Time 6   Period Weeks   Status New   PT LONG TERM GOAL #2   Title Improved R knee ROM 0-120 degrees to normalize function.   Time 6   Period Weeks   Status New   PT LONG TERM GOAL #3   Title Able to amb 500 feet in clinic safely without AD   Time 6   Period Weeks   Status New   PT LONG TERM GOAL #4   Title Able to climb 2  stairs with reciprocal gait and no UE support   Time 6   Period Weeks   Status New   PT LONG TERM GOAL #5   Title Decreased edema to within  1.5 cm of L knee   Time 6   Period Weeks   Status New             Patient will benefit  from skilled therapeutic intervention in order to improve the following deficits and impairments:     Visit Diagnosis: Pain in left knee  Stiffness of left knee, not elsewhere classified  Localized edema     Problem List Patient Active Problem List   Diagnosis Date Noted  . COLLES' FRACTURE, LEFT 11/12/2009    Kari Young, Kari Young 10/18/2015, 7:11 PM  [facility] [address] [place], [place], [address] Phone: [phone]   Fax:  [phone]  Name: Kari Young MRN: [id number] Date of Birth: 08-28-1940

## 2015-10-23 ENCOUNTER — Ambulatory Visit: Payer: Medicare Other | Admitting: Physical Therapy

## 2015-10-23 DIAGNOSIS — M25662 Stiffness of left knee, not elsewhere classified: Secondary | ICD-10-CM

## 2015-10-23 DIAGNOSIS — M25562 Pain in left knee: Secondary | ICD-10-CM | POA: Diagnosis not present

## 2015-10-23 DIAGNOSIS — R600 Localized edema: Secondary | ICD-10-CM

## 2015-10-23 NOTE — Therapy (Addendum)
[facility] [place], [place], [address] Phone: [phone]   Fax:  [phone]  Physical Therapy Treatment  Patient Details  Name: Kari Young MRN: [id number] Date of Birth: 01-May-1941 Referring Provider: Tyson Alias MD  Encounter Date: 10/23/2015      PT End of Session - 10/23/15 1719    Visit Number 5   Number of Visits 18   Date for PT Re-Evaluation 11/19/15      Past Medical History  Diagnosis Date  . Hypertension   . MRSA infection     Past Surgical History  Procedure Laterality Date  . Appendectomy    . Abdominal hysterectomy    . Nasal sinus surgery      There were no vitals filed for this visit.      Subjective Assessment - 10/23/15 1719    Subjective Patient feels ready for discharge.  She states her husband helps her at home.  She is now off the walker without assistance and feels good.  Recommended we place her on hold shoulder she decide to continue therapy.   Patient is accompained by: Family member   Pertinent History HTN, CHF, bladder infection   Patient Stated Goals Get back to the old [name]   Pain Score 3    Pain Location Knee   Pain Orientation Right   Pain Descriptors / Indicators Aching   Pain Type Surgical pain   Pain Onset 1 to 4 weeks ago            [facility] PT Assessment - 10/23/15 0001    Observation/Other Assessments-Edema    Edema --  Patient wears a TED hose to control edema.   AROM   Right Knee Extension 5   Right Knee Flexion 105   PROM   Right Knee Extension 0   Right Knee Flexion 110                                  PT Long Term Goals - 10/23/15 1724    PT LONG TERM GOAL #1   Title I with HEP   Time 6   Period Weeks   Status On-going   PT LONG TERM GOAL #2   Title Improved R knee ROM 0-120 degrees to normalize function.   Time 6   Period Weeks   Status On-going   PT LONG TERM GOAL #3   Title Able to amb 500  feet in clinic safely without AD   Time 6   Period Weeks   Status Achieved   PT LONG TERM GOAL #4   Title Able to climb 2  stairs with reciprocal gait and no UE support   Time 6   Period Weeks   Status On-going   PT LONG TERM GOAL #5   Title Decreased edema to within  1.5 cm of L knee   Time 6   Period Weeks   Status On-going             Patient will benefit from skilled therapeutic intervention in order to improve the following deficits and impairments:     Visit Diagnosis: Pain in left knee  Stiffness of left knee, not elsewhere classified  Localized edema     Problem List Patient Active Problem List   Diagnosis Date Noted  . COLLES' FRACTURE, LEFT 11/12/2009  Treatment:  Nustep x 16 minutes f/b right knee PROM in  supine x 10 minutes f/b medium vasopneumatic x 15 minutes.    PHYSICAL THERAPY DISCHARGE SUMMARY  Visits from Start of Care: 5.  Current functional level related to goals / functional outcomes: See above.   Remaining deficits: Patient did not return to PT.   Education / Equipment: HEP. Plan: Patient agrees to discharge.  Patient goals were not met. Patient is being discharged due to not returning since the last visit.  ?????      APP[place], [place] MPT 10/23/2015, 5:26 PM  [facility] [address] [place], [place], [address] Phone: [phone]   Fax:  [phone]  Name: BLANCA CARREON MRN: [id number] Date of Birth: 1940-11-10

## 2015-10-25 ENCOUNTER — Encounter: Payer: Medicare Other | Admitting: Physical Therapy

## 2016-12-17 ENCOUNTER — Other Ambulatory Visit (HOSPITAL_COMMUNITY): Payer: Self-pay | Admitting: *Deleted

## 2016-12-17 DIAGNOSIS — R42 Dizziness and giddiness: Secondary | ICD-10-CM

## 2016-12-23 ENCOUNTER — Ambulatory Visit (HOSPITAL_COMMUNITY)
Admission: RE | Admit: 2016-12-23 | Discharge: 2016-12-23 | Disposition: A | Discharge status: Home / Self Care | Payer: Medicare HMO | Source: Ambulatory Visit | Attending: *Deleted | Admitting: *Deleted

## 2016-12-23 DIAGNOSIS — R9082 White matter disease, unspecified: Secondary | ICD-10-CM | POA: Insufficient documentation

## 2016-12-23 DIAGNOSIS — R42 Dizziness and giddiness: Secondary | ICD-10-CM | POA: Insufficient documentation

## 2017-07-15 ENCOUNTER — Ambulatory Visit: Payer: Medicare HMO | Admitting: Urology

## 2017-07-15 DIAGNOSIS — N3941 Urge incontinence: Secondary | ICD-10-CM | POA: Diagnosis not present

## 2017-07-15 DIAGNOSIS — N3021 Other chronic cystitis with hematuria: Secondary | ICD-10-CM

## 2017-08-26 ENCOUNTER — Ambulatory Visit: Payer: Medicare HMO | Admitting: Urology

## 2017-08-26 DIAGNOSIS — N3941 Urge incontinence: Secondary | ICD-10-CM

## 2017-08-26 DIAGNOSIS — N3021 Other chronic cystitis with hematuria: Secondary | ICD-10-CM

## 2017-10-07 ENCOUNTER — Ambulatory Visit: Payer: Medicare HMO | Admitting: Urology

## 2017-11-25 ENCOUNTER — Ambulatory Visit: Payer: Medicare HMO | Admitting: Urology

## 2017-12-02 ENCOUNTER — Ambulatory Visit: Payer: Medicare HMO | Admitting: Urology

## 2018-01-06 ENCOUNTER — Ambulatory Visit: Payer: Medicare HMO | Admitting: Urology

## 2018-01-06 DIAGNOSIS — N3941 Urge incontinence: Secondary | ICD-10-CM | POA: Diagnosis not present

## 2018-01-06 DIAGNOSIS — N3021 Other chronic cystitis with hematuria: Secondary | ICD-10-CM | POA: Diagnosis not present

## 2018-01-23 ENCOUNTER — Emergency Department (HOSPITAL_COMMUNITY): Payer: Medicare HMO

## 2018-01-23 ENCOUNTER — Inpatient Hospital Stay (HOSPITAL_COMMUNITY)
Admission: EM | Admit: 2018-01-23 | Discharge: 2018-01-26 | DRG: 309 | Disposition: A | Discharge status: Skilled Nursing Facility | Payer: Medicare HMO | Attending: Internal Medicine | Admitting: Internal Medicine

## 2018-01-23 ENCOUNTER — Encounter (HOSPITAL_COMMUNITY): Payer: Self-pay | Admitting: Emergency Medicine

## 2018-01-23 ENCOUNTER — Other Ambulatory Visit: Payer: Self-pay

## 2018-01-23 DIAGNOSIS — Z882 Allergy status to sulfonamides status: Secondary | ICD-10-CM

## 2018-01-23 DIAGNOSIS — I10 Essential (primary) hypertension: Secondary | ICD-10-CM

## 2018-01-23 DIAGNOSIS — R42 Dizziness and giddiness: Secondary | ICD-10-CM

## 2018-01-23 DIAGNOSIS — N390 Urinary tract infection, site not specified: Secondary | ICD-10-CM | POA: Diagnosis present

## 2018-01-23 DIAGNOSIS — R079 Chest pain, unspecified: Secondary | ICD-10-CM | POA: Diagnosis present

## 2018-01-23 DIAGNOSIS — Y92009 Unspecified place in unspecified non-institutional (private) residence as the place of occurrence of the external cause: Secondary | ICD-10-CM

## 2018-01-23 DIAGNOSIS — R001 Bradycardia, unspecified: Secondary | ICD-10-CM | POA: Diagnosis not present

## 2018-01-23 DIAGNOSIS — Z8614 Personal history of Methicillin resistant Staphylococcus aureus infection: Secondary | ICD-10-CM

## 2018-01-23 DIAGNOSIS — I493 Ventricular premature depolarization: Secondary | ICD-10-CM | POA: Diagnosis present

## 2018-01-23 DIAGNOSIS — Z90710 Acquired absence of both cervix and uterus: Secondary | ICD-10-CM

## 2018-01-23 DIAGNOSIS — R296 Repeated falls: Secondary | ICD-10-CM | POA: Diagnosis not present

## 2018-01-23 DIAGNOSIS — I351 Nonrheumatic aortic (valve) insufficiency: Secondary | ICD-10-CM | POA: Diagnosis present

## 2018-01-23 DIAGNOSIS — S22000A Wedge compression fracture of unspecified thoracic vertebra, initial encounter for closed fracture: Secondary | ICD-10-CM | POA: Diagnosis present

## 2018-01-23 DIAGNOSIS — M549 Dorsalgia, unspecified: Secondary | ICD-10-CM | POA: Diagnosis present

## 2018-01-23 DIAGNOSIS — I5032 Chronic diastolic (congestive) heart failure: Secondary | ICD-10-CM | POA: Diagnosis present

## 2018-01-23 DIAGNOSIS — Z79899 Other long term (current) drug therapy: Secondary | ICD-10-CM

## 2018-01-23 DIAGNOSIS — M4854XA Collapsed vertebra, not elsewhere classified, thoracic region, initial encounter for fracture: Secondary | ICD-10-CM | POA: Diagnosis present

## 2018-01-23 DIAGNOSIS — R55 Syncope and collapse: Secondary | ICD-10-CM | POA: Diagnosis present

## 2018-01-23 DIAGNOSIS — R000 Tachycardia, unspecified: Secondary | ICD-10-CM | POA: Diagnosis present

## 2018-01-23 DIAGNOSIS — F329 Major depressive disorder, single episode, unspecified: Secondary | ICD-10-CM | POA: Diagnosis present

## 2018-01-23 DIAGNOSIS — R0789 Other chest pain: Secondary | ICD-10-CM

## 2018-01-23 DIAGNOSIS — K5900 Constipation, unspecified: Secondary | ICD-10-CM | POA: Diagnosis present

## 2018-01-23 DIAGNOSIS — T447X5A Adverse effect of beta-adrenoreceptor antagonists, initial encounter: Secondary | ICD-10-CM | POA: Diagnosis present

## 2018-01-23 DIAGNOSIS — Z885 Allergy status to narcotic agent status: Secondary | ICD-10-CM

## 2018-01-23 DIAGNOSIS — I110 Hypertensive heart disease with heart failure: Secondary | ICD-10-CM | POA: Diagnosis present

## 2018-01-23 DIAGNOSIS — W19XXXA Unspecified fall, initial encounter: Secondary | ICD-10-CM | POA: Diagnosis present

## 2018-01-23 LAB — DIFFERENTIAL
ABS IMMATURE GRANULOCYTES: 0 10*3/uL (ref 0.0–0.1)
BASOS ABS: 0.1 10*3/uL (ref 0.0–0.1)
Basophils Relative: 1 %
Eosinophils Absolute: 0.1 10*3/uL (ref 0.0–0.7)
Eosinophils Relative: 2 %
IMMATURE GRANULOCYTES: 0 %
Lymphocytes Relative: 36 %
Lymphs Abs: 2 10*3/uL (ref 0.7–4.0)
Monocytes Absolute: 0.4 10*3/uL (ref 0.1–1.0)
Monocytes Relative: 7 %
NEUTROS ABS: 3 10*3/uL (ref 1.7–7.7)
Neutrophils Relative %: 54 %

## 2018-01-23 LAB — COMPREHENSIVE METABOLIC PANEL
ALT: 19 U/L (ref 0–44)
AST: 20 U/L (ref 15–41)
Albumin: 3.5 g/dL (ref 3.5–5.0)
Alkaline Phosphatase: 72 U/L (ref 38–126)
Anion gap: 10 (ref 5–15)
BILIRUBIN TOTAL: 0.4 mg/dL (ref 0.3–1.2)
BUN: 18 mg/dL (ref 8–23)
CHLORIDE: 104 mmol/L (ref 98–111)
CO2: 29 mmol/L (ref 22–32)
Calcium: 9.7 mg/dL (ref 8.9–10.3)
Creatinine, Ser: 0.92 mg/dL (ref 0.44–1.00)
GFR, EST NON AFRICAN AMERICAN: 59 mL/min — AB (ref >60)
Glucose, Bld: 108 mg/dL — ABNORMAL HIGH (ref 70–99)
POTASSIUM: 4.3 mmol/L (ref 3.5–5.1)
Sodium: 143 mmol/L (ref 135–145)
TOTAL PROTEIN: 6.7 g/dL (ref 6.5–8.1)

## 2018-01-23 LAB — URINALYSIS, ROUTINE W REFLEX MICROSCOPIC
Bacteria, UA: NONE SEEN
Bilirubin Urine: NEGATIVE
GLUCOSE, UA: NEGATIVE mg/dL
Ketones, ur: NEGATIVE mg/dL
Leukocytes, UA: NEGATIVE
Nitrite: NEGATIVE
PH: 5 (ref 5.0–8.0)
Protein, ur: NEGATIVE mg/dL
RBC / HPF: >50 RBC/hpf — ABNORMAL HIGH (ref 0–5)
SPECIFIC GRAVITY, URINE: 1.018 (ref 1.005–1.030)

## 2018-01-23 LAB — CK: CK TOTAL: 48 U/L (ref 38–234)

## 2018-01-23 LAB — CBC
HEMATOCRIT: 44.3 % (ref 36.0–46.0)
Hemoglobin: 14 g/dL (ref 12.0–15.0)
MCH: 30.9 pg (ref 26.0–34.0)
MCHC: 31.6 g/dL (ref 30.0–36.0)
MCV: 97.8 fL (ref 78.0–100.0)
PLATELETS: 144 10*3/uL — AB (ref 150–400)
RBC: 4.53 MIL/uL (ref 3.87–5.11)
RDW: 13.3 % (ref 11.5–15.5)
WBC: 5.6 10*3/uL (ref 4.0–10.5)

## 2018-01-23 LAB — CBG MONITORING, ED: Glucose-Capillary: 101 mg/dL — ABNORMAL HIGH (ref 70–99)

## 2018-01-23 LAB — I-STAT TROPONIN, ED: TROPONIN I, POC: 0 ng/mL (ref 0.00–0.08)

## 2018-01-23 LAB — TSH: TSH: 1.375 u[IU]/mL (ref 0.350–4.500)

## 2018-01-23 MED ORDER — SODIUM CHLORIDE 0.9% FLUSH: 3.0000 mL | INTRAVENOUS | Status: DC | PRN

## 2018-01-23 MED ORDER — IOPAMIDOL (ISOVUE-300) INJECTION 61%
100.0000 mL | Freq: Once | INTRAVENOUS | Status: AC | PRN
  Administered 2018-01-23: 100 mL via INTRAVENOUS

## 2018-01-23 MED ORDER — ACETAMINOPHEN 325 MG PO TABS: 650.0000 mg | ORAL_TABLET | Freq: Four times a day (QID) | ORAL | Status: DC | PRN

## 2018-01-23 MED ORDER — IOPAMIDOL (ISOVUE-300) INJECTION 61%
INTRAVENOUS | Status: AC
  Filled 2018-01-23: qty 100

## 2018-01-23 MED ORDER — PANTOPRAZOLE SODIUM 40 MG PO TBEC
40.0000 mg | DELAYED_RELEASE_TABLET | Freq: Two times a day (BID) | ORAL | Status: DC
Start: 2018-01-24 — End: 2018-01-26
  Administered 2018-01-24 – 2018-01-26 (×6): 40 mg via ORAL
  Filled 2018-01-23 (×6): qty 1

## 2018-01-23 MED ORDER — VITAMIN B-12 1000 MCG PO TABS
500.0000 ug | ORAL_TABLET | Freq: Every day | ORAL | Status: DC
  Administered 2018-01-24 – 2018-01-25 (×2): 500 ug via ORAL
  Filled 2018-01-23 (×3): qty 1

## 2018-01-23 MED ORDER — VENLAFAXINE HCL ER 150 MG PO CP24
150.0000 mg | ORAL_CAPSULE | Freq: Every day | ORAL | Status: DC
  Administered 2018-01-24 – 2018-01-26 (×3): 150 mg via ORAL
  Filled 2018-01-23: qty 1
  Filled 2018-01-23: qty 2
  Filled 2018-01-23: qty 1
  Filled 2018-01-23: qty 2
  Filled 2018-01-23: qty 1
  Filled 2018-01-23: qty 2

## 2018-01-23 MED ORDER — ENOXAPARIN SODIUM 40 MG/0.4ML ~~LOC~~ SOLN
40.0000 mg | SUBCUTANEOUS | Status: DC
  Filled 2018-01-23: qty 0.4

## 2018-01-23 MED ORDER — ONDANSETRON HCL 4 MG PO TABS: 4.0000 mg | ORAL_TABLET | Freq: Four times a day (QID) | ORAL | Status: DC | PRN

## 2018-01-23 MED ORDER — SODIUM CHLORIDE 0.9 % IV BOLUS
500.0000 mL | Freq: Once | INTRAVENOUS | Status: AC
  Administered 2018-01-23: 500 mL via INTRAVENOUS

## 2018-01-23 MED ORDER — SENNOSIDES-DOCUSATE SODIUM 8.6-50 MG PO TABS
1.0000 | ORAL_TABLET | Freq: Every evening | ORAL | Status: DC | PRN
  Administered 2018-01-25: 1 via ORAL
  Filled 2018-01-23: qty 1

## 2018-01-23 MED ORDER — VITAMIN D 1000 UNITS PO TABS
1000.0000 [IU] | ORAL_TABLET | Freq: Every day | ORAL | Status: DC
  Administered 2018-01-24 – 2018-01-26 (×3): 1000 [IU] via ORAL
  Filled 2018-01-23 (×5): qty 1

## 2018-01-23 MED ORDER — SODIUM CHLORIDE 0.9% FLUSH
3.0000 mL | Freq: Two times a day (BID) | INTRAVENOUS | Status: DC
  Administered 2018-01-25 – 2018-01-26 (×2): 3 mL via INTRAVENOUS

## 2018-01-23 MED ORDER — SODIUM CHLORIDE 0.9% FLUSH
3.0000 mL | Freq: Two times a day (BID) | INTRAVENOUS | Status: DC
  Administered 2018-01-24 – 2018-01-25 (×2): 3 mL via INTRAVENOUS

## 2018-01-23 MED ORDER — KETOROLAC TROMETHAMINE 15 MG/ML IJ SOLN: 15.0000 mg | Freq: Four times a day (QID) | INTRAMUSCULAR | Status: DC | PRN

## 2018-01-23 MED ORDER — ACETAMINOPHEN 650 MG RE SUPP: 650.0000 mg | Freq: Four times a day (QID) | RECTAL | Status: DC | PRN

## 2018-01-23 MED ORDER — MIRABEGRON ER 50 MG PO TB24
50.0000 mg | ORAL_TABLET | Freq: Every day | ORAL | Status: DC
  Administered 2018-01-24 – 2018-01-26 (×3): 50 mg via ORAL
  Filled 2018-01-23 (×3): qty 1

## 2018-01-23 MED ORDER — ONDANSETRON HCL 4 MG/2ML IJ SOLN: 4.0000 mg | Freq: Four times a day (QID) | INTRAMUSCULAR | Status: DC | PRN

## 2018-01-23 MED ORDER — HYDROCODONE-ACETAMINOPHEN 5-325 MG PO TABS: 1.0000 | ORAL_TABLET | ORAL | Status: DC | PRN

## 2018-01-23 MED ORDER — SODIUM CHLORIDE 0.9 % IV SOLN: 250.0000 mL | INTRAVENOUS | Status: DC | PRN

## 2018-01-23 MED ORDER — FUROSEMIDE 20 MG PO TABS: 20.0000 mg | ORAL_TABLET | Freq: Every day | ORAL | Status: DC

## 2018-01-23 NOTE — ED Notes (Signed)
ED Provider at bedside. 

## 2018-01-23 NOTE — H&P (Addendum)
History and Physical    Kari ClinchGwendolyn L Young ZOX:[id number] DOB: July 31, 1940 DOA: 01/23/2018  PCP: Dorisann FramesStallings, Davey, MD   Patient coming from: Home   Chief Complaint: Gen weakness, lightheaded on standing with recurrent near-syncope, recurrent falls, back pain   HPI: Kari Young is a 77 y.o. female with medical history significant for chronic diastolic CHF, hypertension, and depression, now presenting to the emergency department for evaluation of generalized weakness, lightheadedness on standing, recurrent near syncopal episodes, and recurrent falls with back pain since a fall several days ago.  Patient is accompanied by her daughter who assist with the history.  Some of the weakness and lightheadedness with falls has been present for approximately 3 years, but patient seems to have acutely worsened over the past couple weeks with worsening generalized weakness, worsening lightheadedness, particularly upon standing, and increasingly frequent falls.  She denies any full loss of consciousness and denies striking her head.  Denies headache, change in vision or hearing, or focal numbness or weakness.  She denies chest pain, but reports occasional palpitations.  She reports pain in the mid back and upper back.  Denies fevers, chills, or shortness of breath.  ED Course: Upon arrival to the ED, patient is found to be saturating low 90s on room air, bradycardic in the low to mid 40s, and vitals otherwise stable.  EKG features marked sinus bradycardia with rate 42, PVCs, and LVH with repolarization abnormality.  Noncontrast head CT reveals stable atrophy and no acute hemorrhage or infarct.  Cervical spine CT is notable for chronic findings, but no acute fracture.  There is no rib fracture or acute cardiopulmonary disease on CT chest.  CT of the abdomen and pelvis is negative for acute intra-abdominal or pelvic abnormality but notable for a T12 compression fracture.  Chemistry panel is unremarkable and CBC is  notable for platelet count of 144,000, better than prior.  Troponin is undetectable.  There is microscopic blood on UA.  Patient was given 500 cc normal saline in the ED.  Blood pressure has remained stable but she remains significantly bradycardic and will be admitted for ongoing evaluation and management of generalized weakness with recurrent near syncopal episodes and falls.  Review of Systems:  All other systems reviewed and apart from HPI, are negative.  Past Medical History:  Diagnosis Date  . Hypertension   . MRSA infection     Past Surgical History:  Procedure Laterality Date  . ABDOMINAL HYSTERECTOMY    . APPENDECTOMY    . NASAL SINUS SURGERY       reports that she has never smoked. She has never used smokeless tobacco. She reports that she does not drink alcohol or use drugs.  Allergies  Allergen Reactions  . Percocet [Oxycodone-Acetaminophen] Itching  . Sulfamethoxazole Nausea Only    Family History  Problem Relation Age of Onset  . Sudden Cardiac Death Neg Hx      Prior to Admission medications   Medication Sig Start Date End Date Taking? Authorizing Provider  Ascorbic Acid (VITAMIN C PO) Take 1 tablet by mouth daily.   Yes [provider]  Cholecalciferol 1000 UNITS tablet Take 1,000 Units by mouth daily.   Yes [provider]  Cyanocobalamin (VITAMIN B-12 PO) Take 1 tablet by mouth daily.   Yes [provider]  esomeprazole (NEXIUM) 40 MG capsule Take 40 mg by mouth 2 (two) times daily before a meal. Reported on 10/08/2015   Yes [provider]  furosemide (LASIX) 20 MG tablet  Take 20 mg by mouth.   Yes [provider]  metoprolol (TOPROL-XL) 200 MG 24 hr tablet Take 200 mg by mouth 2 (two) times daily.   Yes [provider]  mirabegron ER (MYRBETRIQ) 50 MG TB24 tablet Take 50 mg by mouth daily.   Yes [provider]  potassium chloride (K-DUR,KLOR-CON) 10 MEQ tablet Take 10 mEq by mouth 2 (two) times  daily. 01/28/14  Yes [provider]  venlafaxine (EFFEXOR-XR) 150 MG 24 hr capsule Take 150 mg by mouth daily.   Yes [provider]  estrogens, conjugated, (PREMARIN) 1.25 MG tablet Take 1.25 mg by mouth daily.    [provider]    Physical Exam: Vitals:   01/23/18 1900 01/23/18 1945 01/23/18 2015 01/23/18 2100  BP: (!) 143/65 135/61 (!) 153/59 (!) 143/63  Pulse: (!) 51 (!) 47 (!) 45 (!) 46  Resp: 14 15 16 15   SpO2: 97% 95% 98% 99%      Constitutional: NAD, calm  Eyes: PERTLA, lids and conjunctivae normal ENMT: Mucous membranes are moist. Posterior pharynx clear of any exudate or lesions.   Neck: normal, supple, no masses, no thyromegaly Respiratory: clear to auscultation bilaterally, no wheezing, no crackles. Normal respiratory effort.    Cardiovascular: Rate ~45 and regular. No extremity edema.   Abdomen: No distension, no tenderness, soft. Bowel sounds normal.  Musculoskeletal: no clubbing / cyanosis. No joint deformity upper and lower extremities.    Skin: no significant rashes, lesions, ulcers. Warm, dry, well-perfused. Neurologic: No facial asymmetry. Sensation to light touch intact. Moving all extremities.   Psychiatric: Alert and oriented to person, place, and situation. Calm, cooperative.     Labs on Admission: I have personally reviewed following labs and imaging studies  CBC: Recent Labs  Lab 01/23/18 1251  WBC 5.6  NEUTROABS 3.0  HGB 14.0  HCT 44.3  MCV 97.8  PLT 144*   Basic Metabolic Panel: Recent Labs  Lab 01/23/18 1251  NA 143  K 4.3  CL 104  CO2 29  GLUCOSE 108*  BUN 18  CREATININE 0.92  CALCIUM 9.7   GFR: CrCl cannot be calculated (Unknown ideal weight.). Liver Function Tests: Recent Labs  Lab 01/23/18 1251  AST 20  ALT 19  ALKPHOS 72  BILITOT 0.4  PROT 6.7  ALBUMIN 3.5   No results for input(s): LIPASE, AMYLASE in the last 168 hours. No results for input(s): AMMONIA in the last 168  hours. Coagulation Profile: No results for input(s): INR, PROTIME in the last 168 hours. Cardiac Enzymes: No results for input(s): CKTOTAL, CKMB, CKMBINDEX, TROPONINI in the last 168 hours. BNP (last 3 results) No results for input(s): PROBNP in the last 8760 hours. HbA1C: No results for input(s): HGBA1C in the last 72 hours. CBG: Recent Labs  Lab 01/23/18 1317  GLUCAP 101*   Lipid Profile: No results for input(s): CHOL, HDL, LDLCALC, TRIG, CHOLHDL, LDLDIRECT in the last 72 hours. Thyroid Function Tests: No results for input(s): TSH, T4TOTAL, FREET4, T3FREE, THYROIDAB in the last 72 hours. Anemia Panel: No results for input(s): VITAMINB12, FOLATE, FERRITIN, TIBC, IRON, RETICCTPCT in the last 72 hours. Urine analysis:    Component Value Date/Time   COLORURINE YELLOW 01/23/2018 1446   APPEARANCEUR CLEAR 01/23/2018 1446   LABSPEC 1.018 01/23/2018 1446   PHURINE 5.0 01/23/2018 1446   GLUCOSEU NEGATIVE 01/23/2018 1446   HGBUR MODERATE (A) 01/23/2018 1446   BILIRUBINUR NEGATIVE 01/23/2018 1446   KETONESUR NEGATIVE 01/23/2018 1446   PROTEINUR NEGATIVE 01/23/2018  1446   UROBILINOGEN 0.2 08/25/2007 1851   NITRITE NEGATIVE 01/23/2018 1446   LEUKOCYTESUR NEGATIVE 01/23/2018 1446   Sepsis Labs: @LABRCNTIP (procalcitonin:4,lacticidven:4) )No results found for this or any previous visit (from the past 240 hour(s)).   Radiological Exams on Admission: Ct Head Wo Contrast  Result Date: 01/23/2018 CLINICAL DATA:  Altered level of consciousness with questionable fall EXAM: CT HEAD WITHOUT CONTRAST CT CERVICAL SPINE WITHOUT CONTRAST TECHNIQUE: Multidetector CT imaging of the head and cervical spine was performed following the standard protocol without intravenous contrast. Multiplanar CT image reconstructions of the cervical spine were also generated. COMPARISON:  Head CT December 23, 2016 FINDINGS: CT HEAD FINDINGS Brain: Relatively mild diffuse atrophy is stable. There is no intracranial mass,  hemorrhage, extra-axial fluid collection, or midline shift. There is small vessel disease throughout the centra semiovale bilaterally as well as in the anterior and posterior limbs of each internal capsule. There is no new gray-white compartment lesion. No acute infarct is demonstrable on this study. Vascular: There is no appreciable hyperdense vessel. There is calcification in each carotid siphon. Skull: Bony calvarium appears intact. Sinuses/Orbits: There is mucosal thickening in multiple ethmoid air cells. Other visualized paranasal sinuses are clear. Orbits appear symmetric bilaterally except for previous cataract removal on the right. Other: Mastoid air cells are clear. CT CERVICAL SPINE FINDINGS Alignment: There is 1 mm of anterolisthesis of C3 on C4. There is 1 mm of anterolisthesis of C4 on C5. There is 1 mm of retrolisthesis of C5 on C6. No other spondylolisthesis evident. Skull base and vertebrae: Skull base and craniocervical junction regions appear normal. There is pannus posterior to the odontoid, not causing appreciable impression on the craniocervical junction region. There is no appreciable fracture. There are no blastic or lytic bone lesions. Soft tissues and spinal canal: The prevertebral soft tissues and predental space regions are normal. There is no paraspinous lesion. No cord canal hematoma evident. Disc levels: There is severe disc space narrowing at C5-6 and C6-7. There is moderate disc space narrowing at C7-T1. There is slightly milder disc space narrowing at C4-5. There is multilevel facet osteoarthritic change. There is exit foraminal narrowing due to bony hypertrophy at C5-6 and C6-7 bilaterally. No frank disc extrusion or stenosis. Upper chest: Visualized upper lung zones are clear. Other: Mild calcification is noted in the right carotid artery. 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 No acute infarct. No mass or hemorrhage. There are foci of  arterial vascular calcification. There is mucosal thickening in several ethmoid air cells. CT cervical spine: No evident fracture. Slight spondylolisthesis at several levels is due to underlying spondylosis. There is spondylosis/osteoarthritic changes several levels. No frank disc extrusion or stenosis. Slight calcification noted in the right carotid artery. Electronically Signed   By: Bretta Bang III M.D.   On: 01/23/2018 14:14   Ct Chest Wo Contrast  Result Date: 01/23/2018 CLINICAL DATA:  Possible rib fracture post trauma. EXAM: CT CHEST WITHOUT CONTRAST TECHNIQUE: Multidetector CT imaging of the chest was performed following the standard protocol without IV contrast. COMPARISON:  08/25/2007 and chest x-ray 09/05/2011 FINDINGS: Cardiovascular: Borderline cardiomegaly. Calcified plaque over the left anterior descending coronary artery. Mild calcified plaque over the thoracic aorta. Remaining vascular structures are within normal. Mediastinum/Nodes: Several shotty mediastinal lymph nodes are present there is no mediastinal or hilar adenopathy. Remaining mediastinal structures are within normal. Lungs/Pleura: Lungs are well inflated with subtle posterior dependent atelectasis. No lobar consolidation or effusion. Airways are normal. Upper Abdomen:  No acute findings. Mild calcified plaque over the abdominal aorta. Musculoskeletal: Old fifth posterolateral rib fracture. No acute rib fractures. Mild degenerate change of the spine. Compression fracture over the thoracolumbar junction unchanged. IMPRESSION: No acute rib fracture. No acute cardiopulmonary disease. Aortic Atherosclerosis (ICD10-I70.0). Minimal atherosclerotic coronary artery disease. Mild stable compression fracture over the thoracolumbar junction. Electronically Signed   By: Elberta Fortis M.D.   On: 01/23/2018 14:53   Ct Cervical Spine Wo Contrast  Result Date: 01/23/2018 CLINICAL DATA:  Altered level of consciousness with questionable fall  EXAM: CT HEAD WITHOUT CONTRAST CT CERVICAL SPINE WITHOUT CONTRAST TECHNIQUE: Multidetector CT imaging of the head and cervical spine was performed following the standard protocol without intravenous contrast. Multiplanar CT image reconstructions of the cervical spine were also generated. COMPARISON:  Head CT December 23, 2016 FINDINGS: CT HEAD FINDINGS Brain: Relatively mild diffuse atrophy is stable. There is no intracranial mass, hemorrhage, extra-axial fluid collection, or midline shift. There is small vessel disease throughout the centra semiovale bilaterally as well as in the anterior and posterior limbs of each internal capsule. There is no new gray-white compartment lesion. No acute infarct is demonstrable on this study. Vascular: There is no appreciable hyperdense vessel. There is calcification in each carotid siphon. Skull: Bony calvarium appears intact. Sinuses/Orbits: There is mucosal thickening in multiple ethmoid air cells. Other visualized paranasal sinuses are clear. Orbits appear symmetric bilaterally except for previous cataract removal on the right. Other: Mastoid air cells are clear. CT CERVICAL SPINE FINDINGS Alignment: There is 1 mm of anterolisthesis of C3 on C4. There is 1 mm of anterolisthesis of C4 on C5. There is 1 mm of retrolisthesis of C5 on C6. No other spondylolisthesis evident. Skull base and vertebrae: Skull base and craniocervical junction regions appear normal. There is pannus posterior to the odontoid, not causing appreciable impression on the craniocervical junction region. There is no appreciable fracture. There are no blastic or lytic bone lesions. Soft tissues and spinal canal: The prevertebral soft tissues and predental space regions are normal. There is no paraspinous lesion. No cord canal hematoma evident. Disc levels: There is severe disc space narrowing at C5-6 and C6-7. There is moderate disc space narrowing at C7-T1. There is slightly milder disc space narrowing at C4-5.  There is multilevel facet osteoarthritic change. There is exit foraminal narrowing due to bony hypertrophy at C5-6 and C6-7 bilaterally. No frank disc extrusion or stenosis. Upper chest: Visualized upper lung zones are clear. Other: Mild calcification is noted in the right carotid artery. 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 No acute infarct. No mass or hemorrhage. There are foci of arterial vascular calcification. There is mucosal thickening in several ethmoid air cells. CT cervical spine: No evident fracture. Slight spondylolisthesis at several levels is due to underlying spondylosis. There is spondylosis/osteoarthritic changes several levels. No frank disc extrusion or stenosis. Slight calcification noted in the right carotid artery. Electronically Signed   By: Bretta Bang III M.D.   On: 01/23/2018 14:14   Ct Abdomen Pelvis W Contrast  Result Date: 01/23/2018 CLINICAL DATA:  77 y/o  F; fall 5 days ago with back pain. EXAM: CT ABDOMEN AND PELVIS WITH CONTRAST TECHNIQUE: Multidetector CT imaging of the abdomen and pelvis was performed using the standard protocol following bolus administration of intravenous contrast. CONTRAST:  ISOVUE-300 IOPAMIDOL (ISOVUE-300) INJECTION 61% COMPARISON:  08/25/2007 CT abdomen and pelvis. FINDINGS: Lower chest: No acute abnormality. Hepatobiliary: No focal liver abnormality is seen. No  gallstones or gallbladder wall thickening. No intrahepatic biliary ductal dilatation. The common bile duct measures up to 9 mm (series 3, image 28) and stable. Pancreas: Unremarkable. No pancreatic ductal dilatation or surrounding inflammatory changes. Spleen: Normal in size without focal abnormality. Adrenals/Urinary Tract: Adrenal glands are unremarkable. Kidneys are normal, without renal calculi, focal lesion, or hydronephrosis. Bladder is unremarkable. Stomach/Bowel: Stomach is within normal limits. Appendix appears normal. Duodenal  diverticulum measuring 18 mm arising from the second second of the duodenum adjacent to the common bile duct. No evidence of bowel wall thickening, distention, or inflammatory changes. Vascular/Lymphatic: Aortic atherosclerosis. No enlarged abdominal or pelvic lymph nodes. Reproductive: Status post hysterectomy. No adnexal masses. Other: No abdominal wall hernia or abnormality. No abdominopelvic ascites. Musculoskeletal: T12 mild anterior compression deformity is stable. No acute fracture identified. IMPRESSION: 1. No acute process identified. 2. 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 3. Aortic atherosclerosis. 4. Stable mild T12 compression deformity. Electronically Signed   By: Mitzi Hansen M.D.   On: 01/23/2018 19:27    EKG: Independently reviewed. Marked sinus bradycardia (rate 42), PVC's, LVH with repolarization abnormality.   Assessment/Plan  1. Near-syncope with recurrent falls  - Presents with lightheadedness with recurrent near-syncope, generalized weakness, and recurrent falls  - ED workup notable for sinus bradycardia with rates in low-mid 40's  - Most likely etiology seems to be marked bradycardia, likely secondary to metoprolol, but some of these symptoms present for ~3 yrs and may be multifactorial  - Hold Toprol, continue cardiac monitoring, check echocardiogram, TSH, and CK   2. Sinus bradycardia  - Presents with lightheadedness, generalized weakness, and recurrent falls; found to have HR in low-mid 40's with sinus rhythm on EKG  - BP has remained stable and there are no anginal complaints  - She has been taking Toprol 200 mg BID  - Hold Toprol, continue cardiac monitoring    3. Hypertension   - BP at goal  - Hold metoprolol for bradycardia  - Monitor and start new agent if needed    4. Chronic diastolic CHF  - Appears compensated, possibly hypovolemic  - Given 500 cc NS in ED  - Hold Lasix initially, follow daily wt and I/O's   5.  Depression  - Continue Effexor     DVT prophylaxis: Lovenox Code Status: Full  Family Communication: Daughter updated at bedside Consults called: None Admission status: Observation     Briscoe Deutscher, MD [facility] Pager [phone]  If 7PM-7AM, please contact night-coverage [email or web address] Password [facility]  01/23/2018, 9:14 PM

## 2018-01-23 NOTE — ED Provider Notes (Signed)
[facility] EMERGENCY DEPARTMENT Provider Note   CSN: [id number] Arrival date & time: 01/23/18  1211     History   Chief Complaint Chief Complaint  Patient presents with  . Bradycardia  . Back Pain  . Altered Mental Status    HPI Kari Young is a 77 y.o. female.  Patient has been weak and has fallen 5 times in the last week.  She complains of back pain  The history is provided by the patient. No language interpreter was used.  Weakness  Primary symptoms include dizziness. This is a new problem. The current episode started more than 2 days ago. The problem has not changed since onset.There was no focality noted. There has been no fever. Pertinent negatives include no shortness of breath, no chest pain, no confusion and no headaches. There were no medications administered prior to arrival. Associated medical issues include trauma.    Past Medical History:  Diagnosis Date  . Hypertension   . MRSA infection     Patient Active Problem List   Diagnosis Date Noted  . Hypertension 01/23/2018  . Sinus bradycardia by electrocardiogram 01/23/2018  . Postural dizziness with near syncope 01/23/2018  . Frequent falls 01/23/2018  . Thoracic compression fracture, closed, initial encounter (HCC) 01/23/2018  . Chronic diastolic CHF (congestive heart failure) (HCC) 01/23/2018  . COLLES' FRACTURE, LEFT 11/12/2009    Past Surgical History:  Procedure Laterality Date  . ABDOMINAL HYSTERECTOMY    . APPENDECTOMY    . NASAL SINUS SURGERY       OB History   None      Home Medications    Prior to Admission medications   Medication Sig Start Date End Date Taking? Authorizing Provider  Ascorbic Acid (VITAMIN C PO) Take 1 tablet by mouth daily.   Yes [provider]  Cholecalciferol 1000 UNITS tablet Take 1,000 Units by mouth daily.   Yes [provider]  Cyanocobalamin (VITAMIN B-12 PO) Take 1 tablet by mouth daily.   Yes [provider]  esomeprazole (NEXIUM) 40 MG capsule Take 40 mg by mouth 2 (two) times daily before a meal. Reported on 10/08/2015   Yes [provider]  furosemide (LASIX) 20 MG tablet Take 20 mg by mouth.   Yes [provider]  metoprolol (TOPROL-XL) 200 MG 24 hr tablet Take 200 mg by mouth 2 (two) times daily.   Yes [provider]  mirabegron ER (MYRBETRIQ) 50 MG TB24 tablet Take 50 mg by mouth daily.   Yes [provider]  potassium chloride (K-DUR,KLOR-CON) 10 MEQ tablet Take 10 mEq by mouth 2 (two) times daily. 01/28/14  Yes [provider]  venlafaxine (EFFEXOR-XR) 150 MG 24 hr capsule Take 150 mg by mouth daily.   Yes [provider]  estrogens, conjugated, (PREMARIN) 1.25 MG tablet Take 1.25 mg by mouth daily.    [provider]    Family History History reviewed. No pertinent family history.  Social History Social History   Tobacco Use  . Smoking status: Never Smoker  . Smokeless tobacco: Never Used  Substance Use Topics  . Alcohol use: No  . Drug use: Never     Allergies   Percocet [oxycodone-acetaminophen] and Sulfamethoxazole   Review of Systems Review of Systems  Constitutional: Negative for appetite change and fatigue.  HENT: Negative for congestion, ear discharge and sinus pressure.   Eyes: Negative for discharge.  Respiratory: Negative for cough and shortness of breath.   Cardiovascular:  Negative for chest pain.  Gastrointestinal: Negative for abdominal pain and diarrhea.  Genitourinary: Negative for frequency and hematuria.  Musculoskeletal: Negative for back pain.  Skin: Negative for rash.  Neurological: Positive for dizziness and weakness. Negative for seizures and headaches.  Psychiatric/Behavioral: Negative for confusion and hallucinations.     Physical Exam Updated Vital Signs BP (!) 153/59   Pulse (!) 45   Resp 16   SpO2 98%   Physical Exam  Constitutional: She is oriented to  person, place, and time. She appears well-developed.  HENT:  Head: Normocephalic.  Eyes: Conjunctivae and EOM are normal. No scleral icterus.  Neck: Neck supple. No thyromegaly present.  Cardiovascular: Exam reveals no gallop and no friction rub.  No murmur heard. Bradycardia  Pulmonary/Chest: No stridor. She has no wheezes. She has no rales. She exhibits no tenderness.  Abdominal: She exhibits no distension. There is no tenderness. There is no rebound.  Musculoskeletal: Normal range of motion. She exhibits no edema.  Tender right upper back   Lymphadenopathy:    She has no cervical adenopathy.  Neurological: She is oriented to person, place, and time. She exhibits normal muscle tone. Coordination normal.  Skin: No rash noted. No erythema.  Psychiatric: She has a normal mood and affect. Her behavior is normal.     ED Treatments / Results  Labs (all labs ordered are listed, but only abnormal results are displayed) Labs Reviewed  COMPREHENSIVE METABOLIC PANEL - Abnormal; Notable for the following components:      Result Value   Glucose, Bld 108 (*)    GFR calc non Af Amer 59 (*)    All other components within normal limits  CBC - Abnormal; Notable for the following components:   Platelets 144 (*)    All other components within normal limits  URINALYSIS, ROUTINE W REFLEX MICROSCOPIC - Abnormal; Notable for the following components:   Hgb urine dipstick MODERATE (*)    RBC / HPF >50 (*)    All other components within normal limits  CBG MONITORING, ED - Abnormal; Notable for the following components:   Glucose-Capillary 101 (*)    All other components within normal limits  DIFFERENTIAL  I-STAT TROPONIN, ED    EKG EKG Interpretation  Date/Time:  Saturday January 23 2018 12:22:46 EDT Ventricular Rate:  42 PR Interval:  178 QRS Duration: 96 QT Interval:  514 QTC Calculation: 429 R Axis:   -10 Text Interpretation:  Marked sinus bradycardia with Premature supraventricular  complexes Left ventricular hypertrophy with repolarization abnormality Cannot rule out Septal infarct , age undetermined Cannot rule out Inferior infarct , age undetermined Abnormal ECG Confirmed by Bethann BerkshireZammit, Cathalina Barcia [phone]([id number]) on 01/23/2018 12:35:37 PM   Radiology Ct Head Wo Contrast  Result Date: 01/23/2018 CLINICAL DATA:  Altered level of consciousness with questionable fall EXAM: CT HEAD WITHOUT CONTRAST CT CERVICAL SPINE WITHOUT CONTRAST TECHNIQUE: Multidetector CT imaging of the head and cervical spine was performed following the standard protocol without intravenous contrast. Multiplanar CT image reconstructions of the cervical spine were also generated. COMPARISON:  Head CT December 23, 2016 FINDINGS: CT HEAD FINDINGS Brain: Relatively mild diffuse atrophy is stable. There is no intracranial mass, hemorrhage, extra-axial fluid collection, or midline shift. There is small vessel disease throughout the centra semiovale bilaterally as well as in the anterior and posterior limbs of each internal capsule. There is no new gray-white compartment lesion. No acute infarct is demonstrable on this study. Vascular: There is no appreciable hyperdense vessel. There is calcification  in each carotid siphon. Skull: Bony calvarium appears intact. Sinuses/Orbits: There is mucosal thickening in multiple ethmoid air cells. Other visualized paranasal sinuses are clear. Orbits appear symmetric bilaterally except for previous cataract removal on the right. Other: Mastoid air cells are clear. CT CERVICAL SPINE FINDINGS Alignment: There is 1 mm of anterolisthesis of C3 on C4. There is 1 mm of anterolisthesis of C4 on C5. There is 1 mm of retrolisthesis of C5 on C6. No other spondylolisthesis evident. Skull base and vertebrae: Skull base and craniocervical junction regions appear normal. There is pannus posterior to the odontoid, not causing appreciable impression on the craniocervical junction region. There is no appreciable fracture.  There are no blastic or lytic bone lesions. Soft tissues and spinal canal: The prevertebral soft tissues and predental space regions are normal. There is no paraspinous lesion. No cord canal hematoma evident. Disc levels: There is severe disc space narrowing at C5-6 and C6-7. There is moderate disc space narrowing at C7-T1. There is slightly milder disc space narrowing at C4-5. There is multilevel facet osteoarthritic change. There is exit foraminal narrowing due to bony hypertrophy at C5-6 and C6-7 bilaterally. No frank disc extrusion or stenosis. Upper chest: Visualized upper lung zones are clear. Other: Mild calcification is noted in the right carotid artery. 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 No acute infarct. No mass or hemorrhage. There are foci of arterial vascular calcification. There is mucosal thickening in several ethmoid air cells. CT cervical spine: No evident fracture. Slight spondylolisthesis at several levels is due to underlying spondylosis. There is spondylosis/osteoarthritic changes several levels. No frank disc extrusion or stenosis. Slight calcification noted in the right carotid artery. Electronically Signed   By: Bretta BangWilliam  Woodruff III M.D.   On: 01/23/2018 14:14   Ct Chest Wo Contrast  Result Date: 01/23/2018 CLINICAL DATA:  Possible rib fracture post trauma. EXAM: CT CHEST WITHOUT CONTRAST TECHNIQUE: Multidetector CT imaging of the chest was performed following the standard protocol without IV contrast. COMPARISON:  08/25/2007 and chest x-ray 09/05/2011 FINDINGS: Cardiovascular: Borderline cardiomegaly. Calcified plaque over the left anterior descending coronary artery. Mild calcified plaque over the thoracic aorta. Remaining vascular structures are within normal. Mediastinum/Nodes: Several shotty mediastinal lymph nodes are present there is no mediastinal or hilar adenopathy. Remaining mediastinal structures are within normal. Lungs/Pleura:  Lungs are well inflated with subtle posterior dependent atelectasis. No lobar consolidation or effusion. Airways are normal. Upper Abdomen: No acute findings. Mild calcified plaque over the abdominal aorta. Musculoskeletal: Old fifth posterolateral rib fracture. No acute rib fractures. Mild degenerate change of the spine. Compression fracture over the thoracolumbar junction unchanged. IMPRESSION: No acute rib fracture. No acute cardiopulmonary disease. Aortic Atherosclerosis (ICD10-I70.0). Minimal atherosclerotic coronary artery disease. Mild stable compression fracture over the thoracolumbar junction. Electronically Signed   By: Elberta Fortisaniel  Boyle M.D.   On: 01/23/2018 14:53   Ct Cervical Spine Wo Contrast  Result Date: 01/23/2018 CLINICAL DATA:  Altered level of consciousness with questionable fall EXAM: CT HEAD WITHOUT CONTRAST CT CERVICAL SPINE WITHOUT CONTRAST TECHNIQUE: Multidetector CT imaging of the head and cervical spine was performed following the standard protocol without intravenous contrast. Multiplanar CT image reconstructions of the cervical spine were also generated. COMPARISON:  Head CT December 23, 2016 FINDINGS: CT HEAD FINDINGS Brain: Relatively mild diffuse atrophy is stable. There is no intracranial mass, hemorrhage, extra-axial fluid collection, or midline shift. There is small vessel disease throughout the centra semiovale bilaterally as well as in the anterior and  posterior limbs of each internal capsule. There is no new gray-white compartment lesion. No acute infarct is demonstrable on this study. Vascular: There is no appreciable hyperdense vessel. There is calcification in each carotid siphon. Skull: Bony calvarium appears intact. Sinuses/Orbits: There is mucosal thickening in multiple ethmoid air cells. Other visualized paranasal sinuses are clear. Orbits appear symmetric bilaterally except for previous cataract removal on the right. Other: Mastoid air cells are clear. CT CERVICAL SPINE  FINDINGS Alignment: There is 1 mm of anterolisthesis of C3 on C4. There is 1 mm of anterolisthesis of C4 on C5. There is 1 mm of retrolisthesis of C5 on C6. No other spondylolisthesis evident. Skull base and vertebrae: Skull base and craniocervical junction regions appear normal. There is pannus posterior to the odontoid, not causing appreciable impression on the craniocervical junction region. There is no appreciable fracture. There are no blastic or lytic bone lesions. Soft tissues and spinal canal: The prevertebral soft tissues and predental space regions are normal. There is no paraspinous lesion. No cord canal hematoma evident. Disc levels: There is severe disc space narrowing at C5-6 and C6-7. There is moderate disc space narrowing at C7-T1. There is slightly milder disc space narrowing at C4-5. There is multilevel facet osteoarthritic change. There is exit foraminal narrowing due to bony hypertrophy at C5-6 and C6-7 bilaterally. No frank disc extrusion or stenosis. Upper chest: Visualized upper lung zones are clear. Other: Mild calcification is noted in the right carotid artery. 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 No acute infarct. No mass or hemorrhage. There are foci of arterial vascular calcification. There is mucosal thickening in several ethmoid air cells. CT cervical spine: No evident fracture. Slight spondylolisthesis at several levels is due to underlying spondylosis. There is spondylosis/osteoarthritic changes several levels. No frank disc extrusion or stenosis. Slight calcification noted in the right carotid artery. Electronically Signed   By: Bretta Bang III M.D.   On: 01/23/2018 14:14   Ct Abdomen Pelvis W Contrast  Result Date: 01/23/2018 CLINICAL DATA:  77 y/o  F; fall 5 days ago with back pain. EXAM: CT ABDOMEN AND PELVIS WITH CONTRAST TECHNIQUE: Multidetector CT imaging of the abdomen and pelvis was performed using the standard protocol  following bolus administration of intravenous contrast. CONTRAST:  ISOVUE-300 IOPAMIDOL (ISOVUE-300) INJECTION 61% COMPARISON:  08/25/2007 CT abdomen and pelvis. FINDINGS: Lower chest: No acute abnormality. Hepatobiliary: No focal liver abnormality is seen. No gallstones or gallbladder wall thickening. No intrahepatic biliary ductal dilatation. The common bile duct measures up to 9 mm (series 3, image 28) and stable. Pancreas: Unremarkable. No pancreatic ductal dilatation or surrounding inflammatory changes. Spleen: Normal in size without focal abnormality. Adrenals/Urinary Tract: Adrenal glands are unremarkable. Kidneys are normal, without renal calculi, focal lesion, or hydronephrosis. Bladder is unremarkable. Stomach/Bowel: Stomach is within normal limits. Appendix appears normal. Duodenal diverticulum measuring 18 mm arising from the second second of the duodenum adjacent to the common bile duct. No evidence of bowel wall thickening, distention, or inflammatory changes. Vascular/Lymphatic: Aortic atherosclerosis. No enlarged abdominal or pelvic lymph nodes. Reproductive: Status post hysterectomy. No adnexal masses. Other: No abdominal wall hernia or abnormality. No abdominopelvic ascites. Musculoskeletal: T12 mild anterior compression deformity is stable. No acute fracture identified. IMPRESSION: 1. No acute process identified. 2. 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 3. Aortic atherosclerosis. 4. Stable mild T12 compression deformity. Electronically Signed   By: Mitzi Hansen M.D.   On: 01/23/2018 19:27  Procedures Procedures (including critical care time)  Medications Ordered in ED Medications  iopamidol (ISOVUE-300) 61 % injection (has no administration in time range)  sodium chloride 0.9 % bolus 500 mL (0 mLs Intravenous Stopped 01/23/18 1428)  iopamidol (ISOVUE-300) 61 % injection 100 mL (100 mLs Intravenous Contrast Given 01/23/18 1840)     Initial  Impression / Assessment and Plan / ED Course  I have reviewed the triage vital signs and the nursing notes.  Pertinent labs & imaging results that were available during my care of the patient were reviewed by me and considered in my medical decision making (see chart for details).     [facility] CRITICAL CARE Performed by: Bethann Berkshire Total critical care time:40 minutes Critical care time was exclusive of separately billable procedures and treating other patients. Critical care was necessary to treat or prevent imminent or life-threatening deterioration. Critical care was time spent personally by me on the following activities: development of treatment plan with patient and/or surrogate as well as nursing, discussions with consultants, evaluation of patient's response to treatment, examination of patient, obtaining history from patient or surrogate, ordering and performing treatments and interventions, ordering and review of laboratory studies, ordering and review of radiographic studies, pulse oximetry and re-evaluation of patient's condition. Pt with brady cardia.  Will admit to medicine Final Clinical Impressions(s) / ED Diagnoses   Final diagnoses:  Bradycardia    ED Discharge Orders    None       Bethann Berkshire, MD 01/23/18 2053

## 2018-01-23 NOTE — ED Triage Notes (Signed)
Pt reports to ED for back pain eval following fall 5 days ago.  Pt states, "I felt very, very weak, and when I go I just go." Pt reports multiple falls, denies LOC or head trauma.  PT denies hx bradycardia, HR noted to be 36-44 in triage. Pt endorses dizziness.  Daughter reports increased confusion.

## 2018-01-24 ENCOUNTER — Observation Stay (HOSPITAL_BASED_OUTPATIENT_CLINIC_OR_DEPARTMENT_OTHER): Payer: Medicare HMO

## 2018-01-24 ENCOUNTER — Other Ambulatory Visit: Payer: Self-pay

## 2018-01-24 DIAGNOSIS — R55 Syncope and collapse: Secondary | ICD-10-CM | POA: Diagnosis not present

## 2018-01-24 DIAGNOSIS — R42 Dizziness and giddiness: Secondary | ICD-10-CM | POA: Diagnosis not present

## 2018-01-24 DIAGNOSIS — I340 Nonrheumatic mitral (valve) insufficiency: Secondary | ICD-10-CM | POA: Diagnosis not present

## 2018-01-24 LAB — BASIC METABOLIC PANEL
ANION GAP: 7 (ref 5–15)
BUN: 17 mg/dL (ref 8–23)
CHLORIDE: 107 mmol/L (ref 98–111)
CO2: 29 mmol/L (ref 22–32)
Calcium: 9.1 mg/dL (ref 8.9–10.3)
Creatinine, Ser: 0.85 mg/dL (ref 0.44–1.00)
Glucose, Bld: 107 mg/dL — ABNORMAL HIGH (ref 70–99)
POTASSIUM: 4.5 mmol/L (ref 3.5–5.1)
SODIUM: 143 mmol/L (ref 135–145)

## 2018-01-24 LAB — ECHOCARDIOGRAM COMPLETE
Height: 63 in
Weight: 3120 oz

## 2018-01-24 LAB — CBC
HEMATOCRIT: 40.7 % (ref 36.0–46.0)
Hemoglobin: 13.2 g/dL (ref 12.0–15.0)
MCH: 31.5 pg (ref 26.0–34.0)
MCHC: 32.4 g/dL (ref 30.0–36.0)
MCV: 97.1 fL (ref 78.0–100.0)
Platelets: 133 10*3/uL — ABNORMAL LOW (ref 150–400)
RBC: 4.19 MIL/uL (ref 3.87–5.11)
RDW: 13.6 % (ref 11.5–15.5)
WBC: 5.8 10*3/uL (ref 4.0–10.5)

## 2018-01-24 LAB — GLUCOSE, CAPILLARY
GLUCOSE-CAPILLARY: 135 mg/dL — AB (ref 70–99)
Glucose-Capillary: 93 mg/dL (ref 70–99)

## 2018-01-24 MED ORDER — ENOXAPARIN SODIUM 40 MG/0.4ML ~~LOC~~ SOLN
40.0000 mg | SUBCUTANEOUS | Status: DC
  Administered 2018-01-24 – 2018-01-26 (×3): 40 mg via SUBCUTANEOUS
  Filled 2018-01-24 (×2): qty 0.4

## 2018-01-24 MED ORDER — HYDROCODONE-ACETAMINOPHEN 5-325 MG PO TABS
1.0000 | ORAL_TABLET | Freq: Four times a day (QID) | ORAL | Status: DC | PRN
  Administered 2018-01-24 – 2018-01-25 (×3): 2 via ORAL
  Administered 2018-01-25: 1 via ORAL
  Administered 2018-01-25 – 2018-01-26 (×2): 2 via ORAL
  Filled 2018-01-24 (×6): qty 2

## 2018-01-24 NOTE — Progress Notes (Signed)
PROGRESS NOTE    Kari ClinchGwendolyn L Bauserman  ZOX:[id number] DOB: [birth year] DOA: 01/23/2018 PCP: Dorisann FramesStallings, Davey, MD    Brief Narrative:  Kari Young is a 77 y.o. female with medical history significant for chronic diastolic CHF, hypertension, and depression, now presenting to the emergency department for evaluation of generalized weakness, lightheadedness on standing, recurrent near syncopal episodes, and recurrent falls with back pain since a fall several days ago.  Patient is accompanied by her daughter who assist with the history.  Some of the weakness and lightheadedness with falls has been present for approximately 3 years, but patient seems to have acutely worsened over the past couple weeks with worsening generalized weakness, worsening lightheadedness, particularly upon standing, and increasingly frequent falls.  She denies any full loss of consciousness and denies striking her head.  Denies headache, change in vision or hearing, or focal numbness or weakness.  She denies chest pain, but reports occasional palpitations.  She reports pain in the mid back and upper back.  Denies fevers, chills, or shortness of breath.  ED Course: Upon arrival to the ED, patient is found to be saturating low 90s on room air, bradycardic in the low to mid 40s, and vitals otherwise stable.  EKG features marked sinus bradycardia with rate 42, PVCs, and LVH with repolarization abnormality.  Noncontrast head CT reveals stable atrophy and no acute hemorrhage or infarct.  Cervical spine CT is notable for chronic findings, but no acute fracture.  There is no rib fracture or acute cardiopulmonary disease on CT chest.  CT of the abdomen and pelvis is negative for acute intra-abdominal or pelvic abnormality but notable for a T12 compression fracture.  Chemistry panel is unremarkable and CBC is notable for platelet count of 144,000, better than prior.  Troponin is undetectable.  There is microscopic blood on UA.  Patient was  given 500 cc normal saline in the ED.  Blood pressure has remained stable but she remains significantly bradycardic and will be admitted for ongoing evaluation and management of generalized weakness with recurrent near syncopal episodes and falls.   Assessment & Plan:   Principal Problem:   Postural dizziness with near syncope Active Problems:   Hypertension   Sinus bradycardia by electrocardiogram   Frequent falls   Thoracic compression fracture, closed, initial encounter (HCC)   Chronic diastolic CHF (congestive heart failure) (HCC)   1-Near syncope, recurrent fall; Weakness Continue to hold metoprolol.  Monitor on telemetry.  If no improvement of HR off metoprolol , will ask cardio to evaluate.  TSH normal.  ECHO; Normal LV EF, mild to moderate AR, mild MR. Orthostatic vitals positive SBP 150---133. Improved with holding lasix / BP today 124----standing 144 Check b 12 and vitamin D level.   2-Sinus bradycardia;  Symptoms might be related to bradycardia. Stop metoprolol and monitor.   3-HTN; monitor off BP medications.   4-Depression; Effexor.  5-Compression fracture over the thoracolumbar junction. Started on vicodin.  6-Chronic diastolic HF; hold lasix .   DVT prophylaxis: Lovenox Code Status: full code.  Family Communication: daughter at bedside.  Disposition Plan: onbserve on telemetry   Consultants:  None   Procedures:   ECHO'    Antimicrobials: none   Subjective: She is still feeling weak and tired. Some lightheaded.   Objective: Vitals:   01/23/18 2115 01/24/18 0502 01/24/18 1231 01/24/18 1234  BP: (!) 149/72 (!) 117/52 (!) 131/112 (!) 122/56  Pulse: (!) 43 94 (!) 55 (!) 51  Resp: 16 18 18  18  Temp:  97.6 F (36.4 C)    TempSrc:  Oral    SpO2: 100% 96% 98% 100%  Weight: 88.4 kg 88.5 kg    Height: 5\' 3"  (1.6 m)       Intake/Output Summary (Last 24 hours) at 01/24/2018 1445 Last data filed at 01/24/2018 1409 Gross per 24 hour  Intake 480 ml    Output 550 ml  Net -70 ml   Filed Weights   01/23/18 2115 01/24/18 0502  Weight: 88.4 kg 88.5 kg    Examination:  General exam: Appears calm and comfortable  Respiratory system: Clear to auscultation. Respiratory effort normal. Cardiovascular system: S1 & S2 heard, RRR. No JVD, murmurs, rubs, gallops or clicks. No pedal edema. Gastrointestinal system: Abdomen is nondistended, soft and nontender. No organomegaly or masses felt. Normal bowel sounds heard. Central nervous system: Alert and oriented. No focal neurological deficits. Extremities: Symmetric 5 x 5 power. Skin: No rashes, lesions or ulcers Psychiatry: Judgement and insight appear normal. Mood & affect appropriate.     Data Reviewed: I have personally reviewed following labs and imaging studies  CBC: Recent Labs  Lab 01/23/18 1251 01/24/18 0559  WBC 5.6 5.8  NEUTROABS 3.0  --   HGB 14.0 13.2  HCT 44.3 40.7  MCV 97.8 97.1  PLT 144* 133*   Basic Metabolic Panel: Recent Labs  Lab 01/23/18 1251 01/24/18 0559  NA 143 143  K 4.3 4.5  CL 104 107  CO2 29 29  GLUCOSE 108* 107*  BUN 18 17  CREATININE 0.92 0.85  CALCIUM 9.7 9.1   GFR: Estimated Creatinine Clearance: 59.4 mL/min (by C-G formula based on SCr of 0.85 mg/dL). Liver Function Tests: Recent Labs  Lab 01/23/18 1251  AST 20  ALT 19  ALKPHOS 72  BILITOT 0.4  PROT 6.7  ALBUMIN 3.5   No results for input(s): LIPASE, AMYLASE in the last 168 hours. No results for input(s): AMMONIA in the last 168 hours. Coagulation Profile: No results for input(s): INR, PROTIME in the last 168 hours. Cardiac Enzymes: Recent Labs  Lab 01/23/18 2112  CKTOTAL 48   BNP (last 3 results) No results for input(s): PROBNP in the last 8760 hours. HbA1C: No results for input(s): HGBA1C in the last 72 hours. CBG: Recent Labs  Lab 01/23/18 1317 01/24/18 0505 01/24/18 0827  GLUCAP 101* 93 135*   Lipid Profile: No results for input(s): CHOL, HDL, LDLCALC, TRIG,  CHOLHDL, LDLDIRECT in the last 72 hours. Thyroid Function Tests: Recent Labs    01/23/18 2112  TSH 1.375   Anemia Panel: No results for input(s): VITAMINB12, FOLATE, FERRITIN, TIBC, IRON, RETICCTPCT in the last 72 hours. Sepsis Labs: No results for input(s): PROCALCITON, LATICACIDVEN in the last 168 hours.  No results found for this or any previous visit (from the past 240 hour(s)).       Radiology Studies: Ct Head Wo Contrast  Result Date: 01/23/2018 CLINICAL DATA:  Altered level of consciousness with questionable fall EXAM: CT HEAD WITHOUT CONTRAST CT CERVICAL SPINE WITHOUT CONTRAST TECHNIQUE: Multidetector CT imaging of the head and cervical spine was performed following the standard protocol without intravenous contrast. Multiplanar CT image reconstructions of the cervical spine were also generated. COMPARISON:  Head CT December 23, 2016 FINDINGS: CT HEAD FINDINGS Brain: Relatively mild diffuse atrophy is stable. There is no intracranial mass, hemorrhage, extra-axial fluid collection, or midline shift. There is small vessel disease throughout the centra semiovale bilaterally as well as in the anterior and posterior limbs of  each internal capsule. There is no new gray-white compartment lesion. No acute infarct is demonstrable on this study. Vascular: There is no appreciable hyperdense vessel. There is calcification in each carotid siphon. Skull: Bony calvarium appears intact. Sinuses/Orbits: There is mucosal thickening in multiple ethmoid air cells. Other visualized paranasal sinuses are clear. Orbits appear symmetric bilaterally except for previous cataract removal on the right. Other: Mastoid air cells are clear. CT CERVICAL SPINE FINDINGS Alignment: There is 1 mm of anterolisthesis of C3 on C4. There is 1 mm of anterolisthesis of C4 on C5. There is 1 mm of retrolisthesis of C5 on C6. No other spondylolisthesis evident. Skull base and vertebrae: Skull base and craniocervical junction regions  appear normal. There is pannus posterior to the odontoid, not causing appreciable impression on the craniocervical junction region. There is no appreciable fracture. There are no blastic or lytic bone lesions. Soft tissues and spinal canal: The prevertebral soft tissues and predental space regions are normal. There is no paraspinous lesion. No cord canal hematoma evident. Disc levels: There is severe disc space narrowing at C5-6 and C6-7. There is moderate disc space narrowing at C7-T1. There is slightly milder disc space narrowing at C4-5. There is multilevel facet osteoarthritic change. There is exit foraminal narrowing due to bony hypertrophy at C5-6 and C6-7 bilaterally. No frank disc extrusion or stenosis. Upper chest: Visualized upper lung zones are clear. Other: Mild calcification is noted in the right carotid artery. 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 No acute infarct. No mass or hemorrhage. There are foci of arterial vascular calcification. There is mucosal thickening in several ethmoid air cells. CT cervical spine: No evident fracture. Slight spondylolisthesis at several levels is due to underlying spondylosis. There is spondylosis/osteoarthritic changes several levels. No frank disc extrusion or stenosis. Slight calcification noted in the right carotid artery. Electronically Signed   By: Bretta Bang III M.D.   On: 01/23/2018 14:14   Ct Chest Wo Contrast  Result Date: 01/23/2018 CLINICAL DATA:  Possible rib fracture post trauma. EXAM: CT CHEST WITHOUT CONTRAST TECHNIQUE: Multidetector CT imaging of the chest was performed following the standard protocol without IV contrast. COMPARISON:  08/25/2007 and chest x-ray 09/05/2011 FINDINGS: Cardiovascular: Borderline cardiomegaly. Calcified plaque over the left anterior descending coronary artery. Mild calcified plaque over the thoracic aorta. Remaining vascular structures are within normal. Mediastinum/Nodes:  Several shotty mediastinal lymph nodes are present there is no mediastinal or hilar adenopathy. Remaining mediastinal structures are within normal. Lungs/Pleura: Lungs are well inflated with subtle posterior dependent atelectasis. No lobar consolidation or effusion. Airways are normal. Upper Abdomen: No acute findings. Mild calcified plaque over the abdominal aorta. Musculoskeletal: Old fifth posterolateral rib fracture. No acute rib fractures. Mild degenerate change of the spine. Compression fracture over the thoracolumbar junction unchanged. IMPRESSION: No acute rib fracture. No acute cardiopulmonary disease. Aortic Atherosclerosis (ICD10-I70.0). Minimal atherosclerotic coronary artery disease. Mild stable compression fracture over the thoracolumbar junction. Electronically Signed   By: Elberta Fortis M.D.   On: 01/23/2018 14:53   Ct Cervical Spine Wo Contrast  Result Date: 01/23/2018 CLINICAL DATA:  Altered level of consciousness with questionable fall EXAM: CT HEAD WITHOUT CONTRAST CT CERVICAL SPINE WITHOUT CONTRAST TECHNIQUE: Multidetector CT imaging of the head and cervical spine was performed following the standard protocol without intravenous contrast. Multiplanar CT image reconstructions of the cervical spine were also generated. COMPARISON:  Head CT December 23, 2016 FINDINGS: CT HEAD FINDINGS Brain: Relatively mild diffuse atrophy is stable. There  is no intracranial mass, hemorrhage, extra-axial fluid collection, or midline shift. There is small vessel disease throughout the centra semiovale bilaterally as well as in the anterior and posterior limbs of each internal capsule. There is no new gray-white compartment lesion. No acute infarct is demonstrable on this study. Vascular: There is no appreciable hyperdense vessel. There is calcification in each carotid siphon. Skull: Bony calvarium appears intact. Sinuses/Orbits: There is mucosal thickening in multiple ethmoid air cells. Other visualized paranasal  sinuses are clear. Orbits appear symmetric bilaterally except for previous cataract removal on the right. Other: Mastoid air cells are clear. CT CERVICAL SPINE FINDINGS Alignment: There is 1 mm of anterolisthesis of C3 on C4. There is 1 mm of anterolisthesis of C4 on C5. There is 1 mm of retrolisthesis of C5 on C6. No other spondylolisthesis evident. Skull base and vertebrae: Skull base and craniocervical junction regions appear normal. There is pannus posterior to the odontoid, not causing appreciable impression on the craniocervical junction region. There is no appreciable fracture. There are no blastic or lytic bone lesions. Soft tissues and spinal canal: The prevertebral soft tissues and predental space regions are normal. There is no paraspinous lesion. No cord canal hematoma evident. Disc levels: There is severe disc space narrowing at C5-6 and C6-7. There is moderate disc space narrowing at C7-T1. There is slightly milder disc space narrowing at C4-5. There is multilevel facet osteoarthritic change. There is exit foraminal narrowing due to bony hypertrophy at C5-6 and C6-7 bilaterally. No frank disc extrusion or stenosis. Upper chest: Visualized upper lung zones are clear. Other: Mild calcification is noted in the right carotid artery. 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 No acute infarct. No mass or hemorrhage. There are foci of arterial vascular calcification. There is mucosal thickening in several ethmoid air cells. CT cervical spine: No evident fracture. Slight spondylolisthesis at several levels is due to underlying spondylosis. There is spondylosis/osteoarthritic changes several levels. No frank disc extrusion or stenosis. Slight calcification noted in the right carotid artery. Electronically Signed   By: Bretta BangWilliam  Woodruff III M.D.   On: 01/23/2018 14:14   Ct Abdomen Pelvis W Contrast  Result Date: 01/23/2018 CLINICAL DATA:  77 y/o  F; fall 5 days ago with  back pain. EXAM: CT ABDOMEN AND PELVIS WITH CONTRAST TECHNIQUE: Multidetector CT imaging of the abdomen and pelvis was performed using the standard protocol following bolus administration of intravenous contrast. CONTRAST:  100mL ISOVUE-300 IOPAMIDOL (ISOVUE-300) INJECTION 61% COMPARISON:  08/25/2007 CT abdomen and pelvis. FINDINGS: Lower chest: No acute abnormality. Hepatobiliary: No focal liver abnormality is seen. No gallstones or gallbladder wall thickening. No intrahepatic biliary ductal dilatation. The common bile duct measures up to 9 mm (series 3, image 28) and stable. Pancreas: Unremarkable. No pancreatic ductal dilatation or surrounding inflammatory changes. Spleen: Normal in size without focal abnormality. Adrenals/Urinary Tract: Adrenal glands are unremarkable. Kidneys are normal, without renal calculi, focal lesion, or hydronephrosis. Bladder is unremarkable. Stomach/Bowel: Stomach is within normal limits. Appendix appears normal. Duodenal diverticulum measuring 18 mm arising from the second second of the duodenum adjacent to the common bile duct. No evidence of bowel wall thickening, distention, or inflammatory changes. Vascular/Lymphatic: Aortic atherosclerosis. No enlarged abdominal or pelvic lymph nodes. Reproductive: Status post hysterectomy. No adnexal masses. Other: No abdominal wall hernia or abnormality. No abdominopelvic ascites. Musculoskeletal: T12 mild anterior compression deformity is stable. No acute fracture identified. IMPRESSION: 1. No acute process identified. 2. 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 3. Aortic atherosclerosis. 4. Stable mild T12 compression deformity. Electronically Signed   By: Mitzi Hansen M.D.   On: 01/23/2018 19:27        Scheduled Meds: . cholecalciferol  1,000 Units Oral Daily  . enoxaparin (LOVENOX) injection  40 mg Subcutaneous Q24H  . mirabegron ER  50 mg Oral Daily  . pantoprazole  40 mg Oral BID AC  .  sodium chloride flush  3 mL Intravenous Q12H  . sodium chloride flush  3 mL Intravenous Q12H  . venlafaxine XR  150 mg Oral Daily  . vitamin B-12  500 mcg Oral Daily   Continuous Infusions: . sodium chloride       LOS: 0 days    Time spent: 35 minutes.     Alba Cory, MD [facility] Pager [phone]  If 7PM-7AM, please contact night-coverage [email or web address] Password [email or web address] 01/24/2018, 2:45 PM

## 2018-01-24 NOTE — Progress Notes (Signed)
NURSING PROGRESS NOTE  Kari Young [id number] Admission Data: 01/24/2018 7:02 AM Attending Provider: Alba Coryegalado, [name], MD JYN:[id number]:Stallings, Vale Havenavey, MD Code Status: full   Kari Young is a 77 y.o. female patient admitted from ED:  -No acute distress noted.  -No complaints of shortness of breath.  -No complaints of chest pain.   Cardiac Monitoring: Telemetry in place. Cardiac monitor yields:sinus bradycardia.  Blood pressure (!) 117/52, pulse 94, temperature 97.6 F (36.4 C), temperature source Oral, resp. rate 18, height 5\' 3"  (1.6 m), weight 88.5 kg, SpO2 96 %.   IV Fluids:  IV in place, occlusive dsg intact without redness, IV cath hand right, condition patent and no redness none.   Allergies:  Percocet [oxycodone-acetaminophen] and Sulfamethoxazole  Past Medical History:   has a past medical history of Hypertension and MRSA infection.  Past Surgical History:   has a past surgical history that includes Appendectomy; Abdominal hysterectomy; and Nasal sinus surgery.  Social History:   reports that she has never smoked. She has never used smokeless tobacco. She reports that she does not drink alcohol or use drugs.  Skin: clean dry and intact. Bruise on mid back due to fall  Patient/Family orientated to room. Information packet given to patient/family. Admission inpatient armband information verified with patient/family to include name and date of birth and placed on patient arm. Side rails up x 2, fall assessment and education completed with patient/family. Patient/family able to verbalize understanding of risk associated with falls and verbalized understanding to call for assistance before getting out of bed. Call light within reach. Patient/family able to voice and demonstrate understanding of unit orientation instructions.    Will continue to evaluate and treat per MD orders.

## 2018-01-24 NOTE — Evaluation (Addendum)
Physical Therapy Evaluation Patient Details Name: Kari Young MRN: [id number] DOB: April 09, 1941 Today's Date: 01/24/2018   History of Present Illness  Pt is a 77 y.o. female admitted 01/23/18 with recurrent falls and back pain since a fall several days ago. CT notable for T12 compression fx. PMH includes CHF, HTN, depression.    Clinical Impression  Pt presents with an overall decrease in functional mobility secondary to above. PTA, pt endorses multiple falls the past few years due to dizziness; lives with husband who pt relies on to pick her up off floor when she does fall. Today, pt amb with RW and min guard for balance; educ on back precautions. Discussed recommendation for short-term SNF-level therapies to maximize functional mobility and independence prior tor return home; pt willing to consider. Will follow acutely to address established goals.  Orthostatic BPs  Supine 124/55  Sitting 141/77  Standing 122/77  Standing after 2 min 144/75          Follow Up Recommendations SNF;Supervision for mobility/OOB    Equipment Recommendations  Rolling walker with 5" wheels    Recommendations for Other Services       Precautions / Restrictions Precautions Precautions: Fall;Back Precaution Comments: T12 compression fx, educated on precautions  Restrictions Weight Bearing Restrictions: No      Mobility  Bed Mobility Overal bed mobility: Needs Assistance Bed Mobility: Rolling;Sidelying to Sit;Sit to Sidelying Rolling: Supervision Sidelying to sit: Min assist     Sit to sidelying: Supervision General bed mobility comments: MinA for HHA to assist trunk elevation. Educ on log roll technique  Transfers Overall transfer level: Needs assistance Equipment used: Rolling walker (2 wheeled) Transfers: Sit to/from Stand Sit to Stand: Min guard            Ambulation/Gait Ambulation/Gait assistance: Min guard Gait Distance (Feet): 30 Feet Assistive device: Rolling walker  (2 wheeled) Gait Pattern/deviations: Step-through pattern;Decreased stride length;Trunk flexed Gait velocity: Decreased Gait velocity interpretation: <1.8 ft/sec, indicate of risk for recurrent falls General Gait Details: Slow amb with min guard for balance; DOE 2/4. Pt significantly fearful of falling  Stairs            Wheelchair Mobility    Modified Rankin (Stroke Patients Only)       Balance Overall balance assessment: Needs assistance   Sitting balance-Leahy Scale: Fair       Standing balance-Leahy Scale: Fair                               Pertinent Vitals/Pain Pain Assessment: Faces Faces Pain Scale: Hurts a little bit Pain Location: Back Pain Descriptors / Indicators: Guarding;Sore Pain Intervention(s): Monitored during session;Repositioned    Home Living Family/patient expects to be discharged to:: Private residence Living Arrangements: Spouse/significant other Available Help at Discharge: Family;Available 24 hours/day Type of Home: House Home Access: Stairs to enter Entrance Stairs-Rails: None Entrance Stairs-Number of Steps: 4 Home Layout: Multi-level;Able to live on main level with bedroom/bathroom Home Equipment: Shower seat;Grab bars - tub/shower      Prior Function Level of Independence: Independent         Comments: Reports significant amount of falls the past ~3 years due to "dizziness"     Hand Dominance        Extremity/Trunk Assessment   Upper Extremity Assessment Upper Extremity Assessment: Overall WFL for tasks assessed    Lower Extremity Assessment Lower Extremity Assessment: Generalized weakness  Communication   Communication: No difficulties  Cognition Arousal/Alertness: Awake/alert Behavior During Therapy: WFL for tasks assessed/performed Overall Cognitive Status: Within Functional Limits for tasks assessed                                        General Comments General  comments (skin integrity, edema, etc.): Daughter present     Exercises     Assessment/Plan    PT Assessment Patient needs continued PT services  PT Problem List Decreased strength;Decreased activity tolerance;Decreased balance;Decreased mobility;Decreased knowledge of use of DME       PT Treatment Interventions DME instruction;Stair training;Functional mobility training;Gait training;Therapeutic activities;Therapeutic exercise;Balance training;Patient/family education    PT Goals (Current goals can be found in the Care Plan section)  Acute Rehab PT Goals Patient Stated Goal: Stop falling so much PT Goal Formulation: With patient Time For Goal Achievement: 02/07/18 Potential to Achieve Goals: Good    Frequency Min 3X/week   Barriers to discharge        Co-evaluation               AM-PAC PT "6 Clicks" Daily Activity  Outcome Measure Difficulty turning over in bed (including adjusting bedclothes, sheets and blankets)?: A Little Difficulty moving from lying on back to sitting on the side of the bed? : A Little Difficulty sitting down on and standing up from a chair with arms (e.g., wheelchair, bedside commode, etc,.)?: Unable Help needed moving to and from a bed to chair (including a wheelchair)?: A Little Help needed walking in hospital room?: A Little Help needed climbing 3-5 steps with a railing? : A Lot 6 Click Score: 15    End of Session Equipment Utilized During Treatment: Gait belt Activity Tolerance: Patient tolerated treatment well Patient left: in bed;with call bell/phone within reach;with bed alarm set;with family/visitor present Nurse Communication: Mobility status PT Visit Diagnosis: Other abnormalities of gait and mobility (R26.89);Repeated falls (R29.6);Muscle weakness (generalized) (M62.81)    Time: [id number] PT Time Calculation (min) (ACUTE ONLY): 29 min   Charges:   PT Evaluation $PT Eval Moderate Complexity: 1 Mod PT Treatments $Therapeutic  Activity: 8-22 mins       Ina HomesJaclyn Sie Formisano, PT, DPT Acute Rehab Services  Pager: [phone]  Kari Young 01/24/2018, 10:31 AM

## 2018-01-24 NOTE — Progress Notes (Signed)
Patient resting comfortably during shift report. Denies complaints.  

## 2018-01-24 NOTE — Progress Notes (Signed)
  Echocardiogram 2D Echocardiogram has been performed.  Delcie RochENNINGTON, Arneda [place] 01/24/2018, 12:10 PM

## 2018-01-24 NOTE — Progress Notes (Signed)
Call from [facility] tele off. Pts daughter is bathing her since she had an "accident".

## 2018-01-25 ENCOUNTER — Other Ambulatory Visit: Payer: Self-pay

## 2018-01-25 ENCOUNTER — Observation Stay (HOSPITAL_COMMUNITY): Payer: Medicare HMO

## 2018-01-25 DIAGNOSIS — R55 Syncope and collapse: Secondary | ICD-10-CM

## 2018-01-25 DIAGNOSIS — R001 Bradycardia, unspecified: Secondary | ICD-10-CM | POA: Diagnosis present

## 2018-01-25 DIAGNOSIS — I5032 Chronic diastolic (congestive) heart failure: Secondary | ICD-10-CM

## 2018-01-25 DIAGNOSIS — R0789 Other chest pain: Secondary | ICD-10-CM | POA: Diagnosis not present

## 2018-01-25 DIAGNOSIS — M549 Dorsalgia, unspecified: Secondary | ICD-10-CM | POA: Diagnosis present

## 2018-01-25 DIAGNOSIS — W19XXXA Unspecified fall, initial encounter: Secondary | ICD-10-CM | POA: Diagnosis present

## 2018-01-25 DIAGNOSIS — Z885 Allergy status to narcotic agent status: Secondary | ICD-10-CM | POA: Diagnosis not present

## 2018-01-25 DIAGNOSIS — R079 Chest pain, unspecified: Secondary | ICD-10-CM | POA: Diagnosis present

## 2018-01-25 DIAGNOSIS — T447X5A Adverse effect of beta-adrenoreceptor antagonists, initial encounter: Secondary | ICD-10-CM | POA: Diagnosis present

## 2018-01-25 DIAGNOSIS — F329 Major depressive disorder, single episode, unspecified: Secondary | ICD-10-CM | POA: Diagnosis present

## 2018-01-25 DIAGNOSIS — N390 Urinary tract infection, site not specified: Secondary | ICD-10-CM | POA: Diagnosis present

## 2018-01-25 DIAGNOSIS — R42 Dizziness and giddiness: Secondary | ICD-10-CM

## 2018-01-25 DIAGNOSIS — Z8614 Personal history of Methicillin resistant Staphylococcus aureus infection: Secondary | ICD-10-CM | POA: Diagnosis not present

## 2018-01-25 DIAGNOSIS — I493 Ventricular premature depolarization: Secondary | ICD-10-CM | POA: Diagnosis present

## 2018-01-25 DIAGNOSIS — R000 Tachycardia, unspecified: Secondary | ICD-10-CM | POA: Diagnosis present

## 2018-01-25 DIAGNOSIS — I351 Nonrheumatic aortic (valve) insufficiency: Secondary | ICD-10-CM | POA: Diagnosis present

## 2018-01-25 DIAGNOSIS — I110 Hypertensive heart disease with heart failure: Secondary | ICD-10-CM | POA: Diagnosis present

## 2018-01-25 DIAGNOSIS — M4854XA Collapsed vertebra, not elsewhere classified, thoracic region, initial encounter for fracture: Secondary | ICD-10-CM | POA: Diagnosis present

## 2018-01-25 DIAGNOSIS — Z90710 Acquired absence of both cervix and uterus: Secondary | ICD-10-CM | POA: Diagnosis not present

## 2018-01-25 DIAGNOSIS — Z882 Allergy status to sulfonamides status: Secondary | ICD-10-CM | POA: Diagnosis not present

## 2018-01-25 DIAGNOSIS — Y92009 Unspecified place in unspecified non-institutional (private) residence as the place of occurrence of the external cause: Secondary | ICD-10-CM | POA: Diagnosis not present

## 2018-01-25 DIAGNOSIS — K5900 Constipation, unspecified: Secondary | ICD-10-CM | POA: Diagnosis present

## 2018-01-25 DIAGNOSIS — R296 Repeated falls: Secondary | ICD-10-CM | POA: Diagnosis present

## 2018-01-25 DIAGNOSIS — Z79899 Other long term (current) drug therapy: Secondary | ICD-10-CM | POA: Diagnosis not present

## 2018-01-25 LAB — TROPONIN I
Troponin I: <0.03 ng/mL (ref <0.03)
Troponin I: <0.03 ng/mL (ref <0.03)

## 2018-01-25 LAB — VITAMIN B12: Vitamin B-12: 3066 pg/mL — ABNORMAL HIGH (ref 180–914)

## 2018-01-25 MED ORDER — POLYETHYLENE GLYCOL 3350 17 G PO PACK
17.0000 g | PACK | Freq: Two times a day (BID) | ORAL | Status: DC
  Administered 2018-01-25 – 2018-01-26 (×3): 17 g via ORAL
  Filled 2018-01-25 (×3): qty 1

## 2018-01-25 MED ORDER — NITROGLYCERIN 0.4 MG SL SUBL: 0.4000 mg | SUBLINGUAL_TABLET | SUBLINGUAL | Status: DC | PRN

## 2018-01-25 NOTE — Consult Note (Signed)
Cardiology Consultation:   Patient ID: Kari Young; [id number]; 05-26-1941   Admit date: 01/23/2018 Date of Consult: 01/25/2018  Primary Care Provider: Dorisann Frames, MD Primary Cardiologist: [facility]   Patient Profile:   Kari Young is a 77 y.o. female with a hx of chronic diastolic CHF, hypertension and depression who is being seen today for the evaluation of chest pain at the request of Dr. Sunnie Nielsen.  History of Present Illness:   Kari Young is a 77 year old female with a history stated above who presented to [facility] on 01/23/2018 for the evaluation of generalized weakness/fatigue, lightheadedness on standing and recurrent near syncopal episodes for several days. She reports that her symptoms have progressively become worse over the last 3 years however were more acute in the last several weeks including multiple falls with subsequent vertebral fracture. She reports trouble with short distance walking, personal care and small chores around the house without dyspnea and extreme fatigue. She states that she has had issues with vertigo for many years and has sought help with no resolution. Upon presentation, she denied LOC, headache, changes in vision or hearing or focal numbness or weakness.  She denied orthopnea, LE swelling, recent illness or chest pain.  In the ED, patients saturations were found to be in the low 90s on room air and she was bradycardic in the mid 40s. EKG showed marked bradycardia with HR 42, PVCs and evidence of LVH.  Noncontrast head CT was completed which revealed stable atrophy and no acute hemorrhage or infarct.  Cervical spine CT was notable for chronic findings however, no acute fracture. Troponin was negative. Social work currently in the process of finding placement at a skilled nursing facility secondary to the above.    On 01/25/2018 cardiology was consulted secondary to patient reported episode of chest tightness with no associated symptoms.  Current EKG  pending. Troponin levels negative x2.  CXR ordered with no acute cardiopulmonary abnormalities. Pt reports her chest tightness that began at rest in the midsternal area and felt as if she was wearing a "tight bra".  Initially she thought this was musculoskeletal related to her recent fall however seemed more intense than usual.  She was given p.o. pain medicine with relief.  She has no personal or family history of CAD.  She also describes intermittent shooting of breast pain which is been occurring over the past 3 months with no associated symptoms.  Past Medical History:  Diagnosis Date  . Hypertension   . MRSA infection     Past Surgical History:  Procedure Laterality Date  . ABDOMINAL HYSTERECTOMY    . APPENDECTOMY    . NASAL SINUS SURGERY       Prior to Admission medications   Medication Sig Start Date End Date Taking? Authorizing Provider  Ascorbic Acid (VITAMIN C PO) Take 1 tablet by mouth daily.   Yes [provider]  Cholecalciferol 1000 UNITS tablet Take 1,000 Units by mouth daily.   Yes [provider]  conjugated estrogens (PREMARIN) vaginal cream Place 1 Applicatorful vaginally every Monday, Wednesday, and Friday.   Yes [provider]  Cyanocobalamin (VITAMIN B-12 PO) Take 1 tablet by mouth daily.   Yes [provider]  esomeprazole (NEXIUM) 40 MG capsule Take 40 mg by mouth daily. Reported on 10/08/2015   Yes [provider]  fexofenadine (ALLEGRA) 180 MG tablet Take 180 mg by mouth daily.   Yes [provider]  furosemide (LASIX) 20 MG tablet Take  20 mg by mouth.   Yes [provider]  hydrOXYzine (VISTARIL) 25 MG capsule Take 25 mg by mouth every 8 (eight) hours as needed for itching.   Yes [provider]  magnesium oxide (MAG-OX) 400 MG tablet Take 200 mg by mouth daily.   Yes [provider]  meclizine (ANTIVERT) 25 MG tablet Take 25 mg by mouth 4 (four) times daily as needed for  dizziness.   Yes [provider]  metoprolol (TOPROL-XL) 200 MG 24 hr tablet Take 200 mg by mouth 2 (two) times daily.   Yes [provider]  mirabegron ER (MYRBETRIQ) 50 MG TB24 tablet Take 50 mg by mouth daily.   Yes [provider]  potassium chloride (K-DUR,KLOR-CON) 10 MEQ tablet Take 10 mEq by mouth 2 (two) times daily. 01/28/14  Yes [provider]  Trospium Chloride 60 MG CP24 Take 60 mg by mouth daily.   Yes [provider]  venlafaxine (EFFEXOR-XR) 150 MG 24 hr capsule Take 150 mg by mouth daily.   Yes [provider]    Inpatient Medications: Scheduled Meds: . cholecalciferol  1,000 Units Oral Daily  . enoxaparin (LOVENOX) injection  40 mg Subcutaneous Q24H  . mirabegron ER  50 mg Oral Daily  . pantoprazole  40 mg Oral BID AC  . polyethylene glycol  17 g Oral BID  . sodium chloride flush  3 mL Intravenous Q12H  . sodium chloride flush  3 mL Intravenous Q12H  . venlafaxine XR  150 mg Oral Daily  . vitamin B-12  500 mcg Oral Daily   Continuous Infusions: . sodium chloride     PRN Meds: sodium chloride, acetaminophen **OR** acetaminophen, HYDROcodone-acetaminophen, ketorolac, nitroGLYCERIN, ondansetron **OR** ondansetron (ZOFRAN) IV, senna-docusate, sodium chloride flush  Allergies:    Allergies  Allergen Reactions  . Percocet [Oxycodone-Acetaminophen] Itching  . Sulfamethoxazole Nausea Only    Social History:   Social History   Socioeconomic History  . Marital status: Married    Spouse name: Not on file  . Number of children: Not on file  . Years of education: Not on file  . Highest education level: Not on file  Occupational History  . Not on file  Social Needs  . Financial resource strain: Not on file  . Food insecurity:    Worry: Not on file    Inability: Not on file  . Transportation needs:    Medical: Not on file    Non-medical: Not on file  Tobacco Use  . Smoking status: Never Smoker  . Smokeless  tobacco: Never Used  Substance and Sexual Activity  . Alcohol use: No  . Drug use: Never  . Sexual activity: Not on file  Lifestyle  . Physical activity:    Days per week: Not on file    Minutes per session: Not on file  . Stress: Not on file  Relationships  . Social connections:    Talks on phone: Not on file    Gets together: Not on file    Attends religious service: Not on file    Active member of club or organization: Not on file    Attends meetings of clubs or organizations: Not on file    Relationship status: Not on file  . Intimate partner violence:    Fear of current or ex partner: Not on file    Emotionally abused: Not on file    Physically abused: Not on file    Forced sexual activity: Not on file  Other Topics Concern  . Not on file  Social History Narrative  . Not on file    Family History:   Family History  Problem Relation Age of Onset  . Sudden Cardiac Death Neg Hx    Family Status:  Family Status  Relation Name Status  . Neg Hx  (Not Specified)    ROS:  Please see the history of present illness.  All other ROS reviewed and negative.     Physical Exam/Data:   Vitals:   01/24/18 1941 01/24/18 2328 01/25/18 0444 01/25/18 1220  BP: (!) 137/54 (!) 139/55 124/61 (!) 146/70  Pulse: (!) 46 (!) 54 (!) 54 (!) 58  Resp: 18 18 20    Temp: 97.8 F (36.6 C) 97.8 F (36.6 C) 98 F (36.7 C) 97.9 F (36.6 C)  TempSrc: Oral Oral Oral Oral  SpO2: 96% 98% 94% 98%  Weight:   89.6 kg   Height:        Intake/Output Summary (Last 24 hours) at 01/25/2018 1547 Last data filed at 01/25/2018 1000 Gross per 24 hour  Intake 240 ml  Output 350 ml  Net -110 ml   Filed Weights   01/23/18 2115 01/24/18 0502 01/25/18 0444  Weight: 88.4 kg 88.5 kg 89.6 kg   Body mass index is 34.99 kg/m.   General: Overweight, well developed, well nourished, NAD Skin: Warm, dry, intact  Head: Normocephalic, atraumatic,  clear, moist mucus membranes. Neck: Negative for carotid  bruits. No JVD Lungs:Clear to ausculation bilaterally. No wheezes, rales, or rhonchi. Breathing is unlabored. Cardiovascular: RRR with S1 S2. No murmurs, rubs, gallops, or LV heave appreciated. Abdomen: Soft, non-tender, non-distended with normoactive bowel sounds.  No obvious abdominal masses. MSK: Strength and tone appear normal for age. 5/5 in all extremities Extremities: No edema. No clubbing or cyanosis. DP/PT pulses 2+ bilaterally Neuro: Alert and oriented. No focal deficits. No facial asymmetry. MAE spontaneously. Psych: Responds to questions appropriately with normal affect.     EKG:  The EKG was personally reviewed and demonstrates: 01/23/2018 with SB HR 42, current EKG pending Telemetry:  Telemetry was personally reviewed and demonstrates: 01/25/2018 SB 50's   Relevant CV Studies:  ECHO: 01/24/2018: Study Conclusions  - Left ventricle: The cavity size was normal. Systolic function was   normal. The estimated ejection fraction was in the range of 50%   to 55%. Wall motion was normal; there were no regional wall   motion abnormalities. The tissue Doppler parameters were   abnormal. Indeterminate for quantitation of left ventricular   diastolic dysfunction. - Aortic valve: There was mild to moderate regurgitation.   Regurgitation pressure half-time: 480 ms. - Mitral valve: There was mild regurgitation. - Left atrium: The atrium was mildly dilated. - Atrial septum: No defect or patent foramen ovale was identified. - Tricuspid valve: There was trivial regurgitation. - Pulmonic valve: There was no significant regurgitation.  Impressions:  - Normal LV EF, mild to moderate AR, mild MR.  CATH: None   Laboratory Data:  Chemistry Recent Labs  Lab 01/23/18 1251 01/24/18 0559  NA 143 143  K 4.3 4.5  CL 104 107  CO2 29 29  GLUCOSE 108* 107*  BUN 18 17  CREATININE 0.92 0.85  CALCIUM 9.7 9.1  GFRNONAA 59* >60  GFRAA >60 >60  ANIONGAP 10 7    Total Protein  Date  Value Ref Range Status  01/23/2018 6.7 6.5 - 8.1 g/dL Final   Albumin  Date Value Ref Range Status  01/23/2018  3.5 3.5 - 5.0 g/dL Final   AST  Date Value Ref Range Status  01/23/2018 20 15 - 41 U/L Final   ALT  Date Value Ref Range Status  01/23/2018 19 0 - 44 U/L Final   Alkaline Phosphatase  Date Value Ref Range Status  01/23/2018 72 38 - 126 U/L Final   Total Bilirubin  Date Value Ref Range Status  01/23/2018 0.4 0.3 - 1.2 mg/dL Final   Hematology Recent Labs  Lab 01/23/18 1251 01/24/18 0559  WBC 5.6 5.8  RBC 4.53 4.19  HGB 14.0 13.2  HCT 44.3 40.7  MCV 97.8 97.1  MCH 30.9 31.5  MCHC 31.6 32.4  RDW 13.3 13.6  PLT 144* 133*   Cardiac Enzymes Recent Labs  Lab 01/25/18 0846 01/25/18 1418  TROPONINI <0.03 <0.03    Recent Labs  Lab 01/23/18 1256  TROPIPOC 0.00    BNPNo results for input(s): BNP, PROBNP in the last 168 hours.  DDimer No results for input(s): DDIMER in the last 168 hours. TSH:  Lab Results  Component Value Date   TSH 1.375 01/23/2018   Lipids:No results found for: CHOL, HDL, LDLCALC, LDLDIRECT, TRIG, CHOLHDL HgbA1c:No results found for: HGBA1C  Radiology/Studies:  Ct Head Wo Contrast  Result Date: 01/23/2018 CLINICAL DATA:  Altered level of consciousness with questionable fall EXAM: CT HEAD WITHOUT CONTRAST CT CERVICAL SPINE WITHOUT CONTRAST TECHNIQUE: Multidetector CT imaging of the head and cervical spine was performed following the standard protocol without intravenous contrast. Multiplanar CT image reconstructions of the cervical spine were also generated. COMPARISON:  Head CT December 23, 2016 FINDINGS: CT HEAD FINDINGS Brain: Relatively mild diffuse atrophy is stable. There is no intracranial mass, hemorrhage, extra-axial fluid collection, or midline shift. There is small vessel disease throughout the centra semiovale bilaterally as well as in the anterior and posterior limbs of each internal capsule. There is no new gray-white compartment  lesion. No acute infarct is demonstrable on this study. Vascular: There is no appreciable hyperdense vessel. There is calcification in each carotid siphon. Skull: Bony calvarium appears intact. Sinuses/Orbits: There is mucosal thickening in multiple ethmoid air cells. Other visualized paranasal sinuses are clear. Orbits appear symmetric bilaterally except for previous cataract removal on the right. Other: Mastoid air cells are clear. CT CERVICAL SPINE FINDINGS Alignment: There is 1 mm of anterolisthesis of C3 on C4. There is 1 mm of anterolisthesis of C4 on C5. There is 1 mm of retrolisthesis of C5 on C6. No other spondylolisthesis evident. Skull base and vertebrae: Skull base and craniocervical junction regions appear normal. There is pannus posterior to the odontoid, not causing appreciable impression on the craniocervical junction region. There is no appreciable fracture. There are no blastic or lytic bone lesions. Soft tissues and spinal canal: The prevertebral soft tissues and predental space regions are normal. There is no paraspinous lesion. No cord canal hematoma evident. Disc levels: There is severe disc space narrowing at C5-6 and C6-7. There is moderate disc space narrowing at C7-T1. There is slightly milder disc space narrowing at C4-5. There is multilevel facet osteoarthritic change. There is exit foraminal narrowing due to bony hypertrophy at C5-6 and C6-7 bilaterally. No frank disc extrusion or stenosis. Upper chest: Visualized upper lung zones are clear. Other: Mild calcification is noted in the right carotid artery. 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 No acute infarct. No mass or hemorrhage. There are foci of arterial vascular calcification. There is mucosal thickening in several ethmoid air  cells. CT cervical spine: No evident fracture. Slight spondylolisthesis at several levels is due to underlying spondylosis. There is spondylosis/osteoarthritic changes  several levels. No frank disc extrusion or stenosis. Slight calcification noted in the right carotid artery. Electronically Signed   By: Bretta Bang III M.D.   On: 01/23/2018 14:14   Ct Chest Wo Contrast  Result Date: 01/23/2018 CLINICAL DATA:  Possible rib fracture post trauma. EXAM: CT CHEST WITHOUT CONTRAST TECHNIQUE: Multidetector CT imaging of the chest was performed following the standard protocol without IV contrast. COMPARISON:  08/25/2007 and chest x-ray 09/05/2011 FINDINGS: Cardiovascular: Borderline cardiomegaly. Calcified plaque over the left anterior descending coronary artery. Mild calcified plaque over the thoracic aorta. Remaining vascular structures are within normal. Mediastinum/Nodes: Several shotty mediastinal lymph nodes are present there is no mediastinal or hilar adenopathy. Remaining mediastinal structures are within normal. Lungs/Pleura: Lungs are well inflated with subtle posterior dependent atelectasis. No lobar consolidation or effusion. Airways are normal. Upper Abdomen: No acute findings. Mild calcified plaque over the abdominal aorta. Musculoskeletal: Old fifth posterolateral rib fracture. No acute rib fractures. Mild degenerate change of the spine. Compression fracture over the thoracolumbar junction unchanged. IMPRESSION: No acute rib fracture. No acute cardiopulmonary disease. Aortic Atherosclerosis (ICD10-I70.0). Minimal atherosclerotic coronary artery disease. Mild stable compression fracture over the thoracolumbar junction. Electronically Signed   By: Elberta Fortis M.D.   On: 01/23/2018 14:53   Ct Cervical Spine Wo Contrast  Result Date: 01/23/2018 CLINICAL DATA:  Altered level of consciousness with questionable fall EXAM: CT HEAD WITHOUT CONTRAST CT CERVICAL SPINE WITHOUT CONTRAST TECHNIQUE: Multidetector CT imaging of the head and cervical spine was performed following the standard protocol without intravenous contrast. Multiplanar CT image reconstructions of the  cervical spine were also generated. COMPARISON:  Head CT December 23, 2016 FINDINGS: CT HEAD FINDINGS Brain: Relatively mild diffuse atrophy is stable. There is no intracranial mass, hemorrhage, extra-axial fluid collection, or midline shift. There is small vessel disease throughout the centra semiovale bilaterally as well as in the anterior and posterior limbs of each internal capsule. There is no new gray-white compartment lesion. No acute infarct is demonstrable on this study. Vascular: There is no appreciable hyperdense vessel. There is calcification in each carotid siphon. Skull: Bony calvarium appears intact. Sinuses/Orbits: There is mucosal thickening in multiple ethmoid air cells. Other visualized paranasal sinuses are clear. Orbits appear symmetric bilaterally except for previous cataract removal on the right. Other: Mastoid air cells are clear. CT CERVICAL SPINE FINDINGS Alignment: There is 1 mm of anterolisthesis of C3 on C4. There is 1 mm of anterolisthesis of C4 on C5. There is 1 mm of retrolisthesis of C5 on C6. No other spondylolisthesis evident. Skull base and vertebrae: Skull base and craniocervical junction regions appear normal. There is pannus posterior to the odontoid, not causing appreciable impression on the craniocervical junction region. There is no appreciable fracture. There are no blastic or lytic bone lesions. Soft tissues and spinal canal: The prevertebral soft tissues and predental space regions are normal. There is no paraspinous lesion. No cord canal hematoma evident. Disc levels: There is severe disc space narrowing at C5-6 and C6-7. There is moderate disc space narrowing at C7-T1. There is slightly milder disc space narrowing at C4-5. There is multilevel facet osteoarthritic change. There is exit foraminal narrowing due to bony hypertrophy at C5-6 and C6-7 bilaterally. No frank disc extrusion or stenosis. Upper chest: Visualized upper lung zones are clear. Other: Mild calcification is  noted in the right carotid artery.  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 No acute infarct. No mass or hemorrhage. There are foci of arterial vascular calcification. There is mucosal thickening in several ethmoid air cells. CT cervical spine: No evident fracture. Slight spondylolisthesis at several levels is due to underlying spondylosis. There is spondylosis/osteoarthritic changes several levels. No frank disc extrusion or stenosis. Slight calcification noted in the right carotid artery. Electronically Signed   By: Bretta BangWilliam  Woodruff III M.D.   On: 01/23/2018 14:14   Ct Abdomen Pelvis W Contrast  Result Date: 01/23/2018 CLINICAL DATA:  77 y/o  F; fall 5 days ago with back pain. EXAM: CT ABDOMEN AND PELVIS WITH CONTRAST TECHNIQUE: Multidetector CT imaging of the abdomen and pelvis was performed using the standard protocol following bolus administration of intravenous contrast. CONTRAST:  100mL ISOVUE-300 IOPAMIDOL (ISOVUE-300) INJECTION 61% COMPARISON:  08/25/2007 CT abdomen and pelvis. FINDINGS: Lower chest: No acute abnormality. Hepatobiliary: No focal liver abnormality is seen. No gallstones or gallbladder wall thickening. No intrahepatic biliary ductal dilatation. The common bile duct measures up to 9 mm (series 3, image 28) and stable. Pancreas: Unremarkable. No pancreatic ductal dilatation or surrounding inflammatory changes. Spleen: Normal in size without focal abnormality. Adrenals/Urinary Tract: Adrenal glands are unremarkable. Kidneys are normal, without renal calculi, focal lesion, or hydronephrosis. Bladder is unremarkable. Stomach/Bowel: Stomach is within normal limits. Appendix appears normal. Duodenal diverticulum measuring 18 mm arising from the second second of the duodenum adjacent to the common bile duct. No evidence of bowel wall thickening, distention, or inflammatory changes. Vascular/Lymphatic: Aortic atherosclerosis. No enlarged abdominal or pelvic  lymph nodes. Reproductive: Status post hysterectomy. No adnexal masses. Other: No abdominal wall hernia or abnormality. No abdominopelvic ascites. Musculoskeletal: T12 mild anterior compression deformity is stable. No acute fracture identified. IMPRESSION: 1. No acute process identified. 2. 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 3. Aortic atherosclerosis. 4. Stable mild T12 compression deformity. Electronically Signed   By: Mitzi HansenLance  Furusawa-Stratton M.D.   On: 01/23/2018 19:27   Dg Chest Port 1 View  Result Date: 01/25/2018 CLINICAL DATA:  Shortness of breath. EXAM: PORTABLE CHEST 1 VIEW COMPARISON:  Radiographs of September 05, 2011. FINDINGS: The heart size and mediastinal contours are within normal limits. Both lungs are clear. No pneumothorax or pleural effusion is noted. The visualized skeletal structures are unremarkable. IMPRESSION: No acute cardiopulmonary abnormality seen. Electronically Signed   By: Lupita RaiderJames  Green Jr, M.D.   On: 01/25/2018 09:33   Assessment and Plan:   1. Chest pain: -Patient reports episode of chest tightness which occurred earlier today described as a squeezing sensation with no other associated .  She states it lasted several hours and was relieved with p.o. pain medication. -EKG with pending results, ordered stat -Troponin, <0.03, <0.03 -Echocardiogram with LVEF 50-55% and no WMA -Will plan for Lexiscan if abnormal EKG -Do not start back metoprolol  2. Near syncope with hx of multiple falls: -Pt with long history of vertigo with multiple falls is progressively worsened over the last several weeks -Metoprolol stopped, could be contributing factor given her dose and possible baseline bradycardia rhythm -Will continue to hold -Plan for SNF rehabilitation facility placement secondary to above  3. Sinus bradycardia: -Metoprolol stopped on admission 01/23/2018, remains bradycardic with heart rates in the 50s -Continue to monitor telemetry for signs  of bradycardia, pauses or   4. HTN: -Stable, 146/70, 124/61, 139/55 -No antihypertensives at time   For questions or updates, please contact [facility] Please consult [email or web address]  for contact info under Cardiology/STEMI.   SignedGeorgie Chard NP-C [facility] Pager: [phone] 01/25/2018 3:47 PM

## 2018-01-25 NOTE — Clinical Social Work Note (Addendum)
Reviewed bed offers with patient and daughter. They have selected [facility]. Per MD, patient may be ready for discharge in 1-2 days. CSW left voicemail for admissions coordinator to ensure they have a private room towards the end of the hall.  Charlynn CourtSarah Fortune Brannigan, CSW [phone]  1:24 pm [facility] patient's requests. Admissions coordinator will start insurance authorization.  Charlynn CourtSarah Ellysa Parrack, CSW [phone]

## 2018-01-25 NOTE — [place] FL2 (Signed)
[place] MEDICAID FL2 LEVEL OF CARE SCREENING TOOL     IDENTIFICATION  Patient Name: Kari ClinchGwendolyn L Harkleroad Birthdate: 23-Jul-1940 Sex: female Admission Date (Current Location): 01/23/2018  [facility] [place]  [place]   Facility and Address:  [facility] [place], [address], [place], [place] [address]      Provider Number: [id number]  Attending Physician Name and Address:  Alba Coryegalado, [name], MD  Relative Name and Phone Number:       Current Level of Care: Hospital Recommended Level of Care: Skilled Nursing Facility Prior Approval Number:    Date Approved/Denied:   PASRR Number: [id number]  Discharge Plan: SNF    Current Diagnoses: Patient Active Problem List   Diagnosis Date Noted  . Hypertension 01/23/2018  . Sinus bradycardia by electrocardiogram 01/23/2018  . Postural dizziness with near syncope 01/23/2018  . Frequent falls 01/23/2018  . Thoracic compression fracture, closed, initial encounter (HCC) 01/23/2018  . Chronic diastolic CHF (congestive heart failure) (HCC) 01/23/2018  . COLLES' FRACTURE, LEFT 11/12/2009    Orientation RESPIRATION BLADDER Height & Weight     Self, Time, Situation, Place  Normal Incontinent, External catheter Weight: 197 lb 8 oz (89.6 kg) Height:  5\' 3"  (160 cm)  BEHAVIORAL SYMPTOMS/MOOD NEUROLOGICAL BOWEL NUTRITION STATUS  (None) (None) Continent Diet(Heart healthy)  AMBULATORY STATUS COMMUNICATION OF NEEDS Skin   Limited Assist Verbally Bruising                       Personal Care Assistance Level of Assistance  Bathing, Feeding, Dressing Bathing Assistance: Limited assistance Feeding assistance: Limited assistance Dressing Assistance: Limited assistance     Functional Limitations Info  Sight, Hearing, Speech Sight Info: Adequate Hearing Info: Adequate Speech Info: Adequate    SPECIAL CARE FACTORS FREQUENCY  PT (By licensed PT), Blood pressure, OT (By licensed OT)     PT Frequency:  5 x week OT Frequency: 5 x week            Contractures Contractures Info: Not present    Additional Factors Info  Code Status, Allergies Code Status Info: Full code Allergies Info: Percocet (Oxycodone-acetaminophen), Sulfamethoxazole.           Current Medications (01/25/2018):  This is the current hospital active medication list Current Facility-Administered Medications  Medication Dose Route Frequency Provider Last Rate Last Dose  . 0.9 %  sodium chloride infusion  250 mL Intravenous PRN Opyd, Lavone Neriimothy S, MD      . acetaminophen (TYLENOL) tablet 650 mg  650 mg Oral Q6H PRN Opyd, Lavone Neriimothy S, MD       Or  . acetaminophen (TYLENOL) suppository 650 mg  650 mg Rectal Q6H PRN Opyd, Lavone Neriimothy S, MD      . cholecalciferol (VITAMIN D) tablet 1,000 Units  1,000 Units Oral Daily Briscoe Deutscherpyd, Timothy S, MD   1,000 Units at 01/25/18 0904  . enoxaparin (LOVENOX) injection 40 mg  40 mg Subcutaneous Q24H Regalado, [name], MD   40 mg at 01/24/18 1800  . HYDROcodone-acetaminophen (NORCO/VICODIN) 5-325 MG per tablet 1-2 tablet  1-2 tablet Oral Q6H PRN Regalado, [name], MD   2 tablet at 01/25/18 0904  . ketorolac (TORADOL) 15 MG/ML injection 15 mg  15 mg Intravenous Q6H PRN Opyd, Lavone Neriimothy S, MD      . mirabegron ER (MYRBETRIQ) tablet 50 mg  50 mg Oral Daily Opyd, Lavone Neriimothy S, MD   50 mg at 01/25/18 0904  . nitroGLYCERIN (NITROSTAT) SL  tablet 0.4 mg  0.4 mg Sublingual Q5 min PRN Regalado, [name], MD      . ondansetron (ZOFRAN) tablet 4 mg  4 mg Oral Q6H PRN Opyd, Lavone Neriimothy S, MD       Or  . ondansetron (ZOFRAN) injection 4 mg  4 mg Intravenous Q6H PRN Opyd, Lavone Neriimothy S, MD      . pantoprazole (PROTONIX) EC tablet 40 mg  40 mg Oral BID AC Opyd, Lavone Neriimothy S, MD   40 mg at 01/25/18 0904  . polyethylene glycol (MIRALAX / GLYCOLAX) packet 17 g  17 g Oral BID Regalado, [name], MD      . senna-docusate (Senokot-S) tablet 1 tablet  1 tablet Oral QHS PRN Opyd, Lavone Neriimothy S, MD      . sodium chloride flush (NS) 0.9 %  injection 3 mL  3 mL Intravenous Q12H Opyd, Lavone Neriimothy S, MD   3 mL at 01/25/18 0908  . sodium chloride flush (NS) 0.9 % injection 3 mL  3 mL Intravenous Q12H Opyd, Lavone Neriimothy S, MD   3 mL at 01/25/18 0908  . sodium chloride flush (NS) 0.9 % injection 3 mL  3 mL Intravenous PRN Opyd, Lavone Neriimothy S, MD      . venlafaxine XR (EFFEXOR-XR) 24 hr capsule 150 mg  150 mg Oral Daily Opyd, Lavone Neriimothy S, MD   150 mg at 01/25/18 0904  . vitamin B-12 (CYANOCOBALAMIN) tablet 500 mcg  500 mcg Oral Daily Opyd, Lavone Neriimothy S, MD   500 mcg at 01/25/18 [id number]     Discharge Medications: Please see discharge summary for a list of discharge medications.  Relevant Imaging Results:  Relevant Lab Results:   Additional Information SS#: [id number]. Requesting a private room at the end of the hall. States she is a very private person.  Margarito LinerSarah C Marsa Matteo, LCSW

## 2018-01-25 NOTE — Clinical Social Work Placement (Signed)
   CLINICAL SOCIAL WORK PLACEMENT  NOTE  Date:  01/25/2018  Patient Details  Name: Stevenson ClinchGwendolyn L Charpentier MRN: [id number] Date of Birth: 03-21-1941  Clinical Social Work is seeking post-discharge placement for this patient at the Skilled  Nursing Facility level of care (*CSW will initial, date and re-position this form in  chart as items are completed):  Yes   Patient/family provided with [facility] Clinical Social Work Department's list of facilities offering this level of care within the geographic area requested by the patient (or if unable, by the patient's family).  Yes   Patient/family informed of their freedom to choose among providers that offer the needed level of care, that participate in Medicare, Medicaid or managed care program needed by the patient, have an available bed and are willing to accept the patient.  Yes   Patient/family informed of [facility]'s ownership interest in [facility] and [facility], as well as of the fact that they are under no obligation to receive care at these facilities.  PASRR submitted to EDS on 01/25/18     PASRR number received on       Existing PASRR number confirmed on 01/25/18     FL2 transmitted to all facilities in geographic area requested by pt/family on 01/25/18     FL2 transmitted to all facilities within larger geographic area on       Patient informed that his/her managed care company has contracts with or will negotiate with certain facilities, including the following:            Patient/family informed of bed offers received.  Patient chooses bed at       Physician recommends and patient chooses bed at      Patient to be transferred to   on  .  Patient to be transferred to facility by       Patient family notified on   of transfer.  Name of family member notified:        PHYSICIAN Please sign FL2     Additional Comment:    _______________________________________________ Margarito LinerSarah C Dade Rodin, LCSW 01/25/2018,  10:37 AM

## 2018-01-25 NOTE — Clinical Social Work Note (Signed)
Clinical Social Work Assessment  Patient Details  Name: Kari Young MRN: [id number] Date of Birth: 1941-03-10  Date of referral:  01/25/18               Reason for consult:  Facility Placement, Discharge Planning                Permission sought to share information with:  Facility Sport and exercise psychologist, Family Supports Permission granted to share information::  Yes, Verbal Permission Granted  Name::     Roselyn Meier  Agency::  SNF's  Relationship::  Daughter  Contact Information:  [phone]  Housing/Transportation Living arrangements for the past 2 months:  Single Family Home Source of Information:  Patient, Medical Team, Adult Children Patient Interpreter Needed:  None Criminal Activity/Legal Involvement Pertinent to Current Situation/Hospitalization:  No - Comment as needed Significant Relationships:  Adult Children, Spouse Lives with:  Spouse Do you feel safe going back to the place where you live?  Yes Need for family participation in patient care:  Yes (Comment)  Care giving concerns:  PT recommending SNF once medically stable for discharge.   Social Worker assessment / plan:  CSW met with patient. Daughter at bedside. CSW introduced role and explained that PT recommendations would be discussed. Patient and her daughter agreeable to SNF placement. First preference is [facility]. Patient requesting private room at the end of a hall as she is a very private person. CSW spoke with [facility] Education officer, museum, she stated they have no private rooms. Will notify patient and daughter. CSW had provided them with SNF list and asked them to choose a second preference in case [facility] could not take her. No further concerns. CSW encouraged patient and her daughter to contact CSW as needed. CSW will continue to follow patient and her daughter for support and facilitate discharge to SNF once medically stable.  Employment status:  Retired Nurse, adult PT  Recommendations:  [place] / Referral to community resources:  [place]  Patient/Family's Response to care:  Patient and her daughter agreeable to SNF placement. Patient's husband and daughter supportive and involved in patient's care. Patient and her daughter appreciated social work intervention.  Patient/Family's Understanding of and Emotional Response to Diagnosis, Current Treatment, and Prognosis:  Patient and her daughter have a good understanding of the reason for admission and her need for rehab prior to returning home. Patient stated her husband is 52 years old and unable to physically assist her. Patient and her daughter appear happy with hospital care.  Emotional Assessment Appearance:  Appears stated age Attitude/Demeanor/Rapport:  Engaged, Gracious Affect (typically observed):  Accepting, Appropriate, Calm, Pleasant Orientation:  Oriented to Self, Oriented to Place, Oriented to  Time, Oriented to Situation Alcohol / Substance use:  Never Used Psych involvement (Current and /or in the community):  No (Comment)  Discharge Needs  Concerns to be addressed:  Care Coordination Readmission within the last 30 days:  No Current discharge risk:  Dependent with Mobility Barriers to Discharge:  Continued Medical Work up, [place], LCSW 01/25/2018, 10:32 AM

## 2018-01-25 NOTE — Progress Notes (Addendum)
PROGRESS NOTE    Kari Young  MVH:[id number] DOB: Sep 29, 1940 DOA: 01/23/2018 PCP: Dorisann Frames, MD    Brief Narrative:  Kari Young is a 77 y.o. female with medical history significant for chronic diastolic CHF, hypertension, and depression, now presenting to the emergency department for evaluation of generalized weakness, lightheadedness on standing, recurrent near syncopal episodes, and recurrent falls with back pain since a fall several days ago.  Patient is accompanied by her daughter who assist with the history.  Some of the weakness and lightheadedness with falls has been present for approximately 3 years, but patient seems to have acutely worsened over the past couple weeks with worsening generalized weakness, worsening lightheadedness, particularly upon standing, and increasingly frequent falls.  She denies any full loss of consciousness and denies striking her head.  Denies headache, change in vision or hearing, or focal numbness or weakness.  She denies chest pain, but reports occasional palpitations.  She reports pain in the mid back and upper back.  Denies fevers, chills, or shortness of breath.  ED Course: Upon arrival to the ED, patient is found to be saturating low 90s on room air, bradycardic in the low to mid 40s, and vitals otherwise stable.  EKG features marked sinus bradycardia with rate 42, PVCs, and LVH with repolarization abnormality.  Noncontrast head CT reveals stable atrophy and no acute hemorrhage or infarct.  Cervical spine CT is notable for chronic findings, but no acute fracture.  There is no rib fracture or acute cardiopulmonary disease on CT chest.  CT of the abdomen and pelvis is negative for acute intra-abdominal or pelvic abnormality but notable for a T12 compression fracture.  Chemistry panel is unremarkable and CBC is notable for platelet count of 144,000, better than prior.  Troponin is undetectable.  There is microscopic blood on UA.  Patient was  given 500 cc normal saline in the ED.  Blood pressure has remained stable but she remains significantly bradycardic and will be admitted for ongoing evaluation and management of generalized weakness with recurrent near syncopal episodes and falls.   Assessment & Plan:   Principal Problem:   Postural dizziness with near syncope Active Problems:   Hypertension   Sinus bradycardia by electrocardiogram   Frequent falls   Thoracic compression fracture, closed, initial encounter (HCC)   Chronic diastolic CHF (congestive heart failure) (HCC)   1-Near syncope, recurrent fall; Weakness Continue to hold metoprolol.  Monitor on telemetry.  If no improvement of HR off metoprolol , will ask cardio to evaluate.  TSH normal.  ECHO; Normal LV EF, mild to moderate AR, mild MR. Orthostatic vitals positive SBP 150---133. Improved with holding lasix / BP today 124----standing 144 b 12 and vitamin D level pending.   2-Chest pain. Report tightness.  No dyspnea.  Will check EKG, cycle enzymes. Check chest x ray.  Because on presentation with near syncope, dizziness, bradycardia will consult cardiology.  Nitroglycerin PRN.   Sinus bradycardia;  Symptoms might be related to bradycardia. Stop metoprolol and monitor.  Continue to monitor off metoprolol.   3-HTN; monitor off BP medications.   4-Depression; Effexor.  5-Compression fracture over the thoracolumbar junction. Started on vicodin.  6-Chronic diastolic HF; check chest  Xray. Might need to resume lasix.   DVT prophylaxis: Lovenox Code Status: full code.  Family Communication: daughter at bedside.  Disposition Plan: Patient today develops chest pain, she is still bradycardic, weak. Will need cardiology evaluation. Cycle cardiac enzymes. She will need to stay in hospital  for close observation.   Consultants:  None   Procedures:   ECHO'    Antimicrobials: none   Subjective: She is complaining this morning of chest pain, tightness.  Feels like if she has a very tight bra on.  She denies dyspnea.  She is also complaining of back pain, feels pain is starting again.   Objective: Vitals:   01/24/18 1615 01/24/18 1941 01/24/18 2328 01/25/18 0444  BP: 129/83 (!) 137/54 (!) 139/55 124/61  Pulse: (!) 51 (!) 46 (!) 54 (!) 54  Resp: 18 18 18 20   Temp: 97.6 F (36.4 C) 97.8 F (36.6 C) 97.8 F (36.6 C) 98 F (36.7 C)  TempSrc: Oral Oral Oral Oral  SpO2: 97% 96% 98% 94%  Weight:    89.6 kg  Height:        Intake/Output Summary (Last 24 hours) at 01/25/2018 0843 Last data filed at 01/25/2018 0644 Gross per 24 hour  Intake 480 ml  Output 550 ml  Net -70 ml   Filed Weights   01/23/18 2115 01/24/18 0502 01/25/18 0444  Weight: 88.4 kg 88.5 kg 89.6 kg    Examination:  General exam: NAD Respiratory system: CTA Cardiovascular system: S 1, S 2 RRR Gastrointestinal system: BS present. Soft, nt Central nervous system: non focal.  Extremities: Symmetric power.  Skin: no rashes.    Data Reviewed: I have personally reviewed following labs and imaging studies  CBC: Recent Labs  Lab 01/23/18 1251 01/24/18 0559  WBC 5.6 5.8  NEUTROABS 3.0  --   HGB 14.0 13.2  HCT 44.3 40.7  MCV 97.8 97.1  PLT 144* 133*   Basic Metabolic Panel: Recent Labs  Lab 01/23/18 1251 01/24/18 0559  NA 143 143  K 4.3 4.5  CL 104 107  CO2 29 29  GLUCOSE 108* 107*  BUN 18 17  CREATININE 0.92 0.85  CALCIUM 9.7 9.1   GFR: Estimated Creatinine Clearance: 59.8 mL/min (by C-G formula based on SCr of 0.85 mg/dL). Liver Function Tests: Recent Labs  Lab 01/23/18 1251  AST 20  ALT 19  ALKPHOS 72  BILITOT 0.4  PROT 6.7  ALBUMIN 3.5   No results for input(s): LIPASE, AMYLASE in the last 168 hours. No results for input(s): AMMONIA in the last 168 hours. Coagulation Profile: No results for input(s): INR, PROTIME in the last 168 hours. Cardiac Enzymes: Recent Labs  Lab 01/23/18 2112  CKTOTAL 48   BNP (last 3 results) No  results for input(s): PROBNP in the last 8760 hours. HbA1C: No results for input(s): HGBA1C in the last 72 hours. CBG: Recent Labs  Lab 01/23/18 1317 01/24/18 0505 01/24/18 0827  GLUCAP 101* 93 135*   Lipid Profile: No results for input(s): CHOL, HDL, LDLCALC, TRIG, CHOLHDL, LDLDIRECT in the last 72 hours. Thyroid Function Tests: Recent Labs    01/23/18 2112  TSH 1.375   Anemia Panel: No results for input(s): VITAMINB12, FOLATE, FERRITIN, TIBC, IRON, RETICCTPCT in the last 72 hours. Sepsis Labs: No results for input(s): PROCALCITON, LATICACIDVEN in the last 168 hours.  No results found for this or any previous visit (from the past 240 hour(s)).       Radiology Studies: Ct Head Wo Contrast  Result Date: 01/23/2018 CLINICAL DATA:  Altered level of consciousness with questionable fall EXAM: CT HEAD WITHOUT CONTRAST CT CERVICAL SPINE WITHOUT CONTRAST TECHNIQUE: Multidetector CT imaging of the head and cervical spine was performed following the standard protocol without intravenous contrast. Multiplanar CT image reconstructions of the  cervical spine were also generated. COMPARISON:  Head CT December 23, 2016 FINDINGS: CT HEAD FINDINGS Brain: Relatively mild diffuse atrophy is stable. There is no intracranial mass, hemorrhage, extra-axial fluid collection, or midline shift. There is small vessel disease throughout the centra semiovale bilaterally as well as in the anterior and posterior limbs of each internal capsule. There is no new gray-white compartment lesion. No acute infarct is demonstrable on this study. Vascular: There is no appreciable hyperdense vessel. There is calcification in each carotid siphon. Skull: Bony calvarium appears intact. Sinuses/Orbits: There is mucosal thickening in multiple ethmoid air cells. Other visualized paranasal sinuses are clear. Orbits appear symmetric bilaterally except for previous cataract removal on the right. Other: Mastoid air cells are clear. CT  CERVICAL SPINE FINDINGS Alignment: There is 1 mm of anterolisthesis of C3 on C4. There is 1 mm of anterolisthesis of C4 on C5. There is 1 mm of retrolisthesis of C5 on C6. No other spondylolisthesis evident. Skull base and vertebrae: Skull base and craniocervical junction regions appear normal. There is pannus posterior to the odontoid, not causing appreciable impression on the craniocervical junction region. There is no appreciable fracture. There are no blastic or lytic bone lesions. Soft tissues and spinal canal: The prevertebral soft tissues and predental space regions are normal. There is no paraspinous lesion. No cord canal hematoma evident. Disc levels: There is severe disc space narrowing at C5-6 and C6-7. There is moderate disc space narrowing at C7-T1. There is slightly milder disc space narrowing at C4-5. There is multilevel facet osteoarthritic change. There is exit foraminal narrowing due to bony hypertrophy at C5-6 and C6-7 bilaterally. No frank disc extrusion or stenosis. Upper chest: Visualized upper lung zones are clear. Other: Mild calcification is noted in the right carotid artery. 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 No acute infarct. No mass or hemorrhage. There are foci of arterial vascular calcification. There is mucosal thickening in several ethmoid air cells. CT cervical spine: No evident fracture. Slight spondylolisthesis at several levels is due to underlying spondylosis. There is spondylosis/osteoarthritic changes several levels. No frank disc extrusion or stenosis. Slight calcification noted in the right carotid artery. Electronically Signed   By: Bretta Bang III M.D.   On: 01/23/2018 14:14   Ct Chest Wo Contrast  Result Date: 01/23/2018 CLINICAL DATA:  Possible rib fracture post trauma. EXAM: CT CHEST WITHOUT CONTRAST TECHNIQUE: Multidetector CT imaging of the chest was performed following the standard protocol without IV contrast.  COMPARISON:  08/25/2007 and chest x-ray 09/05/2011 FINDINGS: Cardiovascular: Borderline cardiomegaly. Calcified plaque over the left anterior descending coronary artery. Mild calcified plaque over the thoracic aorta. Remaining vascular structures are within normal. Mediastinum/Nodes: Several shotty mediastinal lymph nodes are present there is no mediastinal or hilar adenopathy. Remaining mediastinal structures are within normal. Lungs/Pleura: Lungs are well inflated with subtle posterior dependent atelectasis. No lobar consolidation or effusion. Airways are normal. Upper Abdomen: No acute findings. Mild calcified plaque over the abdominal aorta. Musculoskeletal: Old fifth posterolateral rib fracture. No acute rib fractures. Mild degenerate change of the spine. Compression fracture over the thoracolumbar junction unchanged. IMPRESSION: No acute rib fracture. No acute cardiopulmonary disease. Aortic Atherosclerosis (ICD10-I70.0). Minimal atherosclerotic coronary artery disease. Mild stable compression fracture over the thoracolumbar junction. Electronically Signed   By: Elberta Fortis M.D.   On: 01/23/2018 14:53   Ct Cervical Spine Wo Contrast  Result Date: 01/23/2018 CLINICAL DATA:  Altered level of consciousness with questionable fall EXAM: CT HEAD WITHOUT  CONTRAST CT CERVICAL SPINE WITHOUT CONTRAST TECHNIQUE: Multidetector CT imaging of the head and cervical spine was performed following the standard protocol without intravenous contrast. Multiplanar CT image reconstructions of the cervical spine were also generated. COMPARISON:  Head CT December 23, 2016 FINDINGS: CT HEAD FINDINGS Brain: Relatively mild diffuse atrophy is stable. There is no intracranial mass, hemorrhage, extra-axial fluid collection, or midline shift. There is small vessel disease throughout the centra semiovale bilaterally as well as in the anterior and posterior limbs of each internal capsule. There is no new gray-white compartment lesion. No  acute infarct is demonstrable on this study. Vascular: There is no appreciable hyperdense vessel. There is calcification in each carotid siphon. Skull: Bony calvarium appears intact. Sinuses/Orbits: There is mucosal thickening in multiple ethmoid air cells. Other visualized paranasal sinuses are clear. Orbits appear symmetric bilaterally except for previous cataract removal on the right. Other: Mastoid air cells are clear. CT CERVICAL SPINE FINDINGS Alignment: There is 1 mm of anterolisthesis of C3 on C4. There is 1 mm of anterolisthesis of C4 on C5. There is 1 mm of retrolisthesis of C5 on C6. No other spondylolisthesis evident. Skull base and vertebrae: Skull base and craniocervical junction regions appear normal. There is pannus posterior to the odontoid, not causing appreciable impression on the craniocervical junction region. There is no appreciable fracture. There are no blastic or lytic bone lesions. Soft tissues and spinal canal: The prevertebral soft tissues and predental space regions are normal. There is no paraspinous lesion. No cord canal hematoma evident. Disc levels: There is severe disc space narrowing at C5-6 and C6-7. There is moderate disc space narrowing at C7-T1. There is slightly milder disc space narrowing at C4-5. There is multilevel facet osteoarthritic change. There is exit foraminal narrowing due to bony hypertrophy at C5-6 and C6-7 bilaterally. No frank disc extrusion or stenosis. Upper chest: Visualized upper lung zones are clear. Other: Mild calcification is noted in the right carotid artery. 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 No acute infarct. No mass or hemorrhage. There are foci of arterial vascular calcification. There is mucosal thickening in several ethmoid air cells. CT cervical spine: No evident fracture. Slight spondylolisthesis at several levels is due to underlying spondylosis. There is spondylosis/osteoarthritic changes several  levels. No frank disc extrusion or stenosis. Slight calcification noted in the right carotid artery. Electronically Signed   By: Bretta BangWilliam  Woodruff III M.D.   On: 01/23/2018 14:14   Ct Abdomen Pelvis W Contrast  Result Date: 01/23/2018 CLINICAL DATA:  77 y/o  F; fall 5 days ago with back pain. EXAM: CT ABDOMEN AND PELVIS WITH CONTRAST TECHNIQUE: Multidetector CT imaging of the abdomen and pelvis was performed using the standard protocol following bolus administration of intravenous contrast. CONTRAST:  100mL ISOVUE-300 IOPAMIDOL (ISOVUE-300) INJECTION 61% COMPARISON:  08/25/2007 CT abdomen and pelvis. FINDINGS: Lower chest: No acute abnormality. Hepatobiliary: No focal liver abnormality is seen. No gallstones or gallbladder wall thickening. No intrahepatic biliary ductal dilatation. The common bile duct measures up to 9 mm (series 3, image 28) and stable. Pancreas: Unremarkable. No pancreatic ductal dilatation or surrounding inflammatory changes. Spleen: Normal in size without focal abnormality. Adrenals/Urinary Tract: Adrenal glands are unremarkable. Kidneys are normal, without renal calculi, focal lesion, or hydronephrosis. Bladder is unremarkable. Stomach/Bowel: Stomach is within normal limits. Appendix appears normal. Duodenal diverticulum measuring 18 mm arising from the second second of the duodenum adjacent to the common bile duct. No evidence of bowel wall thickening, distention, or  inflammatory changes. Vascular/Lymphatic: Aortic atherosclerosis. No enlarged abdominal or pelvic lymph nodes. Reproductive: Status post hysterectomy. No adnexal masses. Other: No abdominal wall hernia or abnormality. No abdominopelvic ascites. Musculoskeletal: T12 mild anterior compression deformity is stable. No acute fracture identified. IMPRESSION: 1. No acute process identified. 2. 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 3. Aortic atherosclerosis. 4. Stable mild T12 compression deformity.  Electronically Signed   By: Mitzi Hansen M.D.   On: 01/23/2018 19:27        Scheduled Meds: . cholecalciferol  1,000 Units Oral Daily  . enoxaparin (LOVENOX) injection  40 mg Subcutaneous Q24H  . mirabegron ER  50 mg Oral Daily  . pantoprazole  40 mg Oral BID AC  . sodium chloride flush  3 mL Intravenous Q12H  . sodium chloride flush  3 mL Intravenous Q12H  . venlafaxine XR  150 mg Oral Daily  . vitamin B-12  500 mcg Oral Daily   Continuous Infusions: . sodium chloride       LOS: 0 days    Time spent: 35 minutes.     Alba Cory, MD [facility] Pager [phone]  If 7PM-7AM, please contact night-coverage [email or web address] Password [facility] 01/25/2018, 8:43 AM

## 2018-01-26 ENCOUNTER — Inpatient Hospital Stay
Admission: RE | Admit: 2018-01-26 | Discharge: 2018-02-03 | Disposition: A | Discharge status: Home / Self Care | Payer: Medicare HMO | Source: Ambulatory Visit | Attending: Internal Medicine | Admitting: Internal Medicine

## 2018-01-26 LAB — URINALYSIS, ROUTINE W REFLEX MICROSCOPIC
Bilirubin Urine: NEGATIVE
Glucose, UA: NEGATIVE mg/dL
HGB URINE DIPSTICK: NEGATIVE
Ketones, ur: NEGATIVE mg/dL
Leukocytes, UA: NEGATIVE
Nitrite: POSITIVE — AB
PROTEIN: NEGATIVE mg/dL
Specific Gravity, Urine: 1.017 (ref 1.005–1.030)
pH: 6 (ref 5.0–8.0)

## 2018-01-26 LAB — GLUCOSE, CAPILLARY: GLUCOSE-CAPILLARY: 123 mg/dL — AB (ref 70–99)

## 2018-01-26 LAB — VITAMIN D 25 HYDROXY (VIT D DEFICIENCY, FRACTURES): Vit D, 25-Hydroxy: 34.9 ng/mL (ref 30.0–100.0)

## 2018-01-26 MED ORDER — CEPHALEXIN 250 MG PO CAPS: 500.0000 mg | ORAL_CAPSULE | Freq: Two times a day (BID) | ORAL | Status: DC

## 2018-01-26 MED ORDER — METOPROLOL TARTRATE 12.5 MG HALF TABLET
12.5000 mg | ORAL_TABLET | Freq: Two times a day (BID) | ORAL | Status: DC
  Administered 2018-01-26: 12.5 mg via ORAL
  Filled 2018-01-26: qty 1

## 2018-01-26 MED ORDER — SENNOSIDES-DOCUSATE SODIUM 8.6-50 MG PO TABS
1.0000 | ORAL_TABLET | Freq: Every day | ORAL | Status: DC
  Administered 2018-01-26: 1 via ORAL
  Filled 2018-01-26: qty 1

## 2018-01-26 MED ORDER — CEPHALEXIN 500 MG PO CAPS: 500.0000 mg | ORAL_CAPSULE | Freq: Two times a day (BID) | ORAL | 0 refills | Status: DC

## 2018-01-26 MED ORDER — POLYETHYLENE GLYCOL 3350 17 G PO PACK: 17.0000 g | PACK | Freq: Two times a day (BID) | ORAL | 0 refills | Status: DC

## 2018-01-26 MED ORDER — SENNOSIDES-DOCUSATE SODIUM 8.6-50 MG PO TABS: 1.0000 | ORAL_TABLET | Freq: Every day | ORAL | 0 refills | Status: DC

## 2018-01-26 MED ORDER — METOPROLOL TARTRATE 25 MG PO TABS: 12.5000 mg | ORAL_TABLET | Freq: Two times a day (BID) | ORAL | 0 refills | Status: DC

## 2018-01-26 MED ORDER — HYDROCODONE-ACETAMINOPHEN 5-325 MG PO TABS: 1.0000 | ORAL_TABLET | Freq: Four times a day (QID) | ORAL | 0 refills | Status: DC | PRN

## 2018-01-26 MED ORDER — BISACODYL 10 MG RE SUPP
10.0000 mg | Freq: Once | RECTAL | Status: AC
  Administered 2018-01-26: 10 mg via RECTAL
  Filled 2018-01-26: qty 1

## 2018-01-26 NOTE — Progress Notes (Signed)
Patient discharged: to SNF, daughter is providing the ride   Via: Wheelchair   Discharge paperwork given: to patient and family  Reviewed with teach back  IV and telemetry disconnected  Belongings given to patient  Bettey MareJasmina  Margrit Minner, RN

## 2018-01-26 NOTE — Clinical Social Work Placement (Signed)
   CLINICAL SOCIAL WORK PLACEMENT  NOTE  Date:  01/26/2018  Patient Details  Name: Kari Young MRN: [id number] Date of Birth: Mar 06, 1941  Clinical Social Work is seeking post-discharge placement for this patient at the Skilled  Nursing Facility level of care (*CSW will initial, date and re-position this form in  chart as items are completed):  Yes   Patient/family provided with [facility] Clinical Social Work Department's list of facilities offering this level of care within the geographic area requested by the patient (or if unable, by the patient's family).  Yes   Patient/family informed of their freedom to choose among providers that offer the needed level of care, that participate in Medicare, Medicaid or managed care program needed by the patient, have an available bed and are willing to accept the patient.  Yes   Patient/family informed of [facility] ownership interest [facility] and [facility], as well as of the fact that they are under no obligation to receive care at these facilities.  PASRR submitted to EDS on 01/25/18     PASRR number received on       Existing PASRR number confirmed on 01/25/18     FL2 transmitted to all facilities in geographic area requested by pt/family on 01/25/18     FL2 transmitted to all facilities within larger geographic area on       Patient informed that his/her managed care company has contracts with or will negotiate with certain facilities, including the following:        Yes   Patient/family informed of bed offers received.  Patient [facility] bed at [facility]     Physician recommends and patient chooses bed at      [facility].  Patient to be transferred to facility by Daughter will transport by car     Patient family notified on 01/26/18 of transfer.  Name of family member notified:  Debbie     PHYSICIAN       Additional Comment:     _______________________________________________ Margarito LinerSarah C Seamus Warehime, LCSW 01/26/2018, 3:32 PM

## 2018-01-26 NOTE — Clinical Social Work Note (Signed)
Insurance authorization approved for discharge to SNF today if stable. CSW paged MD to notify.  Charlynn CourtSarah Albertha Beattie, CSW [phone]

## 2018-01-26 NOTE — Progress Notes (Signed)
Progress Note  Patient Name: Kari ClinchGwendolyn L Olivencia Date of Encounter: 01/26/2018  Primary Cardiologist: Prentice DockerSuresh Koneswaran, MD   Subjective   Patient reports feeling improvement in her energy level today and was able to ambulate down the hall without difficulty this afternoon. She reported another episode of chest pressure last night which occurred at rest and resolve after norco in 45 minutes.    Inpatient Medications    Scheduled Meds: . cholecalciferol  1,000 Units Oral Daily  . enoxaparin (LOVENOX) injection  40 mg Subcutaneous Q24H  . mirabegron ER  50 mg Oral Daily  . pantoprazole  40 mg Oral BID AC  . polyethylene glycol  17 g Oral BID  . senna-docusate  1 tablet Oral Daily  . sodium chloride flush  3 mL Intravenous Q12H  . sodium chloride flush  3 mL Intravenous Q12H  . venlafaxine XR  150 mg Oral Daily   Continuous Infusions: . sodium chloride     PRN Meds: sodium chloride, acetaminophen **OR** acetaminophen, HYDROcodone-acetaminophen, ketorolac, nitroGLYCERIN, ondansetron **OR** ondansetron (ZOFRAN) IV, sodium chloride flush   Vital Signs    Vitals:   01/25/18 2029 01/26/18 0500 01/26/18 1012 01/26/18 1125  BP: 137/82  (!) 128/101 (!) 146/91  Pulse: 70  66 68  Resp: 19   18  Temp: 98.2 F (36.8 C)   98.2 F (36.8 C)  TempSrc: Oral   Oral  SpO2: 98%   96%  Weight:  89.8 kg    Height:        Intake/Output Summary (Last 24 hours) at 01/26/2018 1430 Last data filed at 01/26/2018 0600 Gross per 24 hour  Intake 120 ml  Output -  Net 120 ml   Filed Weights   01/24/18 0502 01/25/18 0444 01/26/18 0500  Weight: 88.5 kg 89.6 kg 89.8 kg    Telemetry    Sinus rhythm with brief episode of tachycardia to 101 - Personally Reviewed  Physical Exam   GEN: Ambulating down the hallway in no acute distress.   Neck: No JVD, no carotid bruits Cardiac: RRR, no murmurs, rubs, or gallops.  Respiratory: Clear to auscultation bilaterally, no wheezes/ rales/ rhonchi GI:  NABS, Soft, nontender, non-distended  MS: non-pitting edema; No deformity. Neuro:  Nonfocal, moving all extremities spontaneously Psych: Normal affect   Labs    Chemistry Recent Labs  Lab 01/23/18 1251 01/24/18 0559  NA 143 143  K 4.3 4.5  CL 104 107  CO2 29 29  GLUCOSE 108* 107*  BUN 18 17  CREATININE 0.92 0.85  CALCIUM 9.7 9.1  PROT 6.7  --   ALBUMIN 3.5  --   AST 20  --   ALT 19  --   ALKPHOS 72  --   BILITOT 0.4  --   GFRNONAA 59* >60  GFRAA >60 >60  ANIONGAP 10 7     Hematology Recent Labs  Lab 01/23/18 1251 01/24/18 0559  WBC 5.6 5.8  RBC 4.53 4.19  HGB 14.0 13.2  HCT 44.3 40.7  MCV 97.8 97.1  MCH 30.9 31.5  MCHC 31.6 32.4  RDW 13.3 13.6  PLT 144* 133*    Cardiac Enzymes Recent Labs  Lab 01/25/18 0846 01/25/18 1418 01/25/18 2126  TROPONINI <0.03 <0.03 <0.03    Recent Labs  Lab 01/23/18 1256  TROPIPOC 0.00     BNPNo results for input(s): BNP, PROBNP in the last 168 hours.   DDimer No results for input(s): DDIMER in the last 168 hours.   Radiology  Dg Chest Port 1 View  Result Date: 01/25/2018 CLINICAL DATA:  Shortness of breath. EXAM: PORTABLE CHEST 1 VIEW COMPARISON:  Radiographs of September 05, 2011. FINDINGS: The heart size and mediastinal contours are within normal limits. Both lungs are clear. No pneumothorax or pleural effusion is noted. The visualized skeletal structures are unremarkable. IMPRESSION: No acute cardiopulmonary abnormality seen. Electronically Signed   By: Lupita RaiderJames  Green Jr, M.D.   On: 01/25/2018 09:33    Cardiac Studies   Echocardiogram 01/24/18: Study Conclusions  - Left ventricle: The cavity size was normal. Systolic function was   normal. The estimated ejection fraction was in the range of 50%   to 55%. Wall motion was normal; there were no regional wall   motion abnormalities. The tissue Doppler parameters were   abnormal. Indeterminate for quantitation of left ventricular   diastolic dysfunction. - Aortic  valve: There was mild to moderate regurgitation.   Regurgitation pressure half-time: 480 ms. - Mitral valve: There was mild regurgitation. - Left atrium: The atrium was mildly dilated. - Atrial septum: No defect or patent foramen ovale was identified. - Tricuspid valve: There was trivial regurgitation. - Pulmonic valve: There was no significant regurgitation.  Impressions:  - Normal LV EF, mild to moderate AR, mild MR.   Patient Profile     77 y.o. female with PMH of chronic diastolic CHF, HTN, and depression who is being followed by cardiology for bradycardia and chest pain.   Assessment & Plan    1. Chest pain: reported by patient this admission on 01/25/18 which lasted several hours and was relieved with po pain medications. Troponins were negative. EKG non-ischemic and unchanged from previous. Echo with EF 50-55% and no WMA.   - No further ischemic work-up at this time - could consider outpatient NST to further evaluate chest pain    2. Bradycardia: patient reported progressive fatigue and pre-syncope. Had been on metoprolol 200mg  BID. Noted to be bradycardic to the 40s this admission. Home metoprolol was held for 24 hours and restarted at a low dose given improvement in HR and intermittent tachycardia. She noted significant improvement in energy level  - Will restart metoprolol 12.5mg  BID with hold parameters  3. HTN: BP mildly elevated. Home metoprolol held yesterday given bradycardia and high doses - Continue metoprolol 12.5mg  BID with hold parameters   [facility] will sign off.   Medication Recommendations:  Metoprolol 12.5mg  BID Other recommendations (labs, testing, etc):  Consider outpatient NST  Follow up as an outpatient:  Patient has an appointment with Dr. Purvis SheffieldKoneswaran 02/15/18.   For questions or updates, please contact [facility] Please consult [email or web address] for contact info under Cardiology/STEMI.      Signed, Beatriz StallionKrista M. Doloris Servantes, PA-C  01/26/2018, 2:30 PM    [phone]

## 2018-01-26 NOTE — Clinical Social Work Note (Signed)
CSW facilitated patient discharge including contacting patient family and facility to confirm patient discharge plans. Clinical information faxed to facility and family agreeable with plan. Patient's daughter will transport by car to [facility]. RN to call report prior to discharge [phone] Room 134).  CSW will sign off for now as social work intervention is no longer needed. Please consult us again if new needs arise.  Charlynn CourtSarah Shermaine Rivet, CSW [phone]

## 2018-01-26 NOTE — Discharge Summary (Addendum)
Physician Discharge Summary  Kari Young WUJ:[id number] DOB: [birth year] DOA: 01/23/2018  PCP: Dorisann FramesStallings, Davey, MD  Admit date: 01/23/2018 Discharge date: 01/26/2018  Admitted From: Home  Disposition: SNF  Recommendations for Outpatient Follow-up:  1. Follow up with PCP in 1-2 weeks 2. Please obtain BMP/CBC in one week 3. Please hold metoprolol for HR less than 60/  4. Needs stress test out patient.  5. Please follow up urine culture.    Discharge Condition: Stable.  CODE STATUS: full code.  Diet recommendation: Heart Healthy   Brief/Interim Summary: Brief Narrative: Kari Young a 77 y.o.femalewith medical history significant forchronic diastolic CHF, hypertension, and depression, now presenting to the emergency department for evaluation of generalized weakness, lightheadedness on standing, recurrent near syncopal episodes, and recurrent falls with back pain since a fall several days ago. Patient is accompanied by her daughter who assist with the history. Some of the weakness and lightheadedness with falls has been present for approximately 3 years, but patient seems to have acutely worsened over the past couple weeks with worsening generalized weakness, worsening lightheadedness, particularly upon standing, and increasingly frequent falls. She denies any full loss of consciousness and denies striking her head. Denies headache, change in vision or hearing, or focal numbness or weakness. She denies chest pain, but reports occasional palpitations. She reports pain in the mid back and upper back. Denies fevers, chills, or shortness of breath.  ED Course:Upon arrival to the ED, patient is found to be saturating low 90s on room air, bradycardic in the low to mid 40s, and vitals otherwise stable. EKG features marked sinus bradycardia with rate 42, PVCs, and LVH with repolarization abnormality. Noncontrast head CT reveals stable atrophy and no acute hemorrhage or  infarct. Cervical spine CT is notable for chronic findings, but no acute fracture. There is no rib fracture or acute cardiopulmonary disease on CT chest. CT of the abdomen and pelvis is negative for acute intra-abdominal or pelvic abnormality but notable for a T12 compression fracture. Chemistry panel is unremarkable and CBC is notable for platelet count of 144,000, better than prior. Troponin is undetectable. There is microscopic blood on UA. Patient was given 500 cc normal saline in the ED. Blood pressure has remained stable but she remains significantly bradycardic and will be admitted for ongoing evaluation and management of generalized weakness with recurrent near syncopal episodes and falls.   Assessment & Plan:   Principal Problem:   Postural dizziness with near syncope Active Problems:   Hypertension   Sinus bradycardia by electrocardiogram   Frequent falls   Thoracic compression fracture, closed, initial encounter (HCC)   Chronic diastolic CHF (congestive heart failure) (HCC)   1-Near syncope, recurrent fall; Weakness Continue to hold metoprolol.  Monitor on telemetry.  If no improvement of HR off metoprolol , will ask cardio to evaluate.  TSH normal.  ECHO; Normal LV EF, mild to moderate AR, mild MR. Orthostatic vitals positive SBP 150---133. Improved with holding lasix / BP today 124----standing 144 B 12 elevated, stop supplement.  vitamin D level 34 . Continue with supplementation.  likely related to bradycardia from high dose metoprolol.  HR has increase. Plan is to resume low dose metoprolol.   2-Chest pain. Report tightness.  No dyspnea. Will check EKG, cycle enzymes. Check chest x ray.  Because on presentation with near syncope, dizziness, bradycardia will consult cardiology.  Nitroglycerin PRN.  Stress test out patient.   Sinus bradycardia;  Symptoms might be related to bradycardia. Stop metoprolol and monitor.  Continue to monitor off metoprolol.  Resolved.  Plan to start low dose metoprolol. Hold for HR less than 60/.   3-HTN; increasing. Cardiology recommend to start metoprolol low dose.   4-Depression; Effexor.   5-Compression fracture over the thoracolumbar junction. Started on vicodin.   6-Chronic diastolic HF; Resume lasix at discharge   7-Constipation; transfer to rehab today if she has bowel movement.   8-UTI;  UA with positive nitrates. 6--10 WBC.  Send urine culture.  Start Keflex for 3 days.   Discharge Diagnoses:  Principal Problem:   Postural dizziness with near syncope Active Problems:   Hypertension   Sinus bradycardia by electrocardiogram   Frequent falls   Thoracic compression fracture, closed, initial encounter (HCC)   Chronic diastolic CHF (congestive heart failure) (HCC)   Near syncope   Bradycardia   Chest tightness    Discharge Instructions   Allergies as of 01/26/2018      Reactions   Percocet [oxycodone-acetaminophen] Itching   Sulfamethoxazole Nausea Only      Medication List    STOP taking these medications   hydrOXYzine 25 MG capsule Commonly known as:  VISTARIL   meclizine 25 MG tablet Commonly known as:  ANTIVERT   metoprolol 200 MG 24 hr tablet Commonly known as:  TOPROL-XL   potassium chloride 10 MEQ tablet Commonly known as:  K-DUR,KLOR-CON   Trospium Chloride 60 MG Cp24     TAKE these medications   Cholecalciferol 1000 units tablet Take 1,000 Units by mouth daily.   conjugated estrogens vaginal cream Commonly known as:  PREMARIN Place 1 Applicatorful vaginally every Monday, Wednesday, and Friday.   esomeprazole 40 MG capsule Commonly known as:  NEXIUM Take 40 mg by mouth daily. Reported on 10/08/2015   fexofenadine 180 MG tablet Commonly known as:  ALLEGRA Take 180 mg by mouth daily.   furosemide 20 MG tablet Commonly known as:  LASIX Take 20 mg by mouth.   HYDROcodone-acetaminophen 5-325 MG tablet Commonly known as:  NORCO/VICODIN Take 1-2  tablets by mouth every 6 (six) hours as needed for moderate pain.   magnesium oxide 400 MG tablet Commonly known as:  MAG-OX Take 200 mg by mouth daily.   MYRBETRIQ 50 MG Tb24 tablet Generic drug:  mirabegron ER Take 50 mg by mouth daily.   polyethylene glycol packet Commonly known as:  MIRALAX / GLYCOLAX Take 17 g by mouth 2 (two) times daily.   senna-docusate 8.6-50 MG tablet Commonly known as:  Senokot-S Take 1 tablet by mouth daily. Start taking on:  01/27/2018   venlafaxine XR 150 MG 24 hr capsule Commonly known as:  EFFEXOR-XR Take 150 mg by mouth daily.   VITAMIN B-12 PO Take 1 tablet by mouth daily.   VITAMIN C PO Take 1 tablet by mouth daily.       Contact information for follow-up providers    Laqueta LindenKoneswaran, Suresh A, MD Follow up on 02/15/2018.   Specialty:  Cardiology Why:  Please arrive 15 minutes early for your 2:40pm appointment Contact information: [address] [place] [id number] [phone]            Contact information for after-discharge care    Destination    [facility] Preferred SNF .   Service:  Skilled Nursing Contact information: [address] [place] [id number] [phone]                 Allergies  Allergen Reactions  . Percocet [Oxycodone-Acetaminophen] Itching  .  Sulfamethoxazole Nausea Only    Consultations:  Cardiology    Procedures/Studies: Ct Head Wo Contrast  Result Date: 01/23/2018 CLINICAL DATA:  Altered level of consciousness with questionable fall EXAM: CT HEAD WITHOUT CONTRAST CT CERVICAL SPINE WITHOUT CONTRAST TECHNIQUE: Multidetector CT imaging of the head and cervical spine was performed following the standard protocol without intravenous contrast. Multiplanar CT image reconstructions of the cervical spine were also generated. COMPARISON:  Head CT December 23, 2016 FINDINGS: CT HEAD FINDINGS Brain: Relatively mild diffuse atrophy is stable. There is no intracranial  mass, hemorrhage, extra-axial fluid collection, or midline shift. There is small vessel disease throughout the centra semiovale bilaterally as well as in the anterior and posterior limbs of each internal capsule. There is no new gray-white compartment lesion. No acute infarct is demonstrable on this study. Vascular: There is no appreciable hyperdense vessel. There is calcification in each carotid siphon. Skull: Bony calvarium appears intact. Sinuses/Orbits: There is mucosal thickening in multiple ethmoid air cells. Other visualized paranasal sinuses are clear. Orbits appear symmetric bilaterally except for previous cataract removal on the right. Other: Mastoid air cells are clear. CT CERVICAL SPINE FINDINGS Alignment: There is 1 mm of anterolisthesis of C3 on C4. There is 1 mm of anterolisthesis of C4 on C5. There is 1 mm of retrolisthesis of C5 on C6. No other spondylolisthesis evident. Skull base and vertebrae: Skull base and craniocervical junction regions appear normal. There is pannus posterior to the odontoid, not causing appreciable impression on the craniocervical junction region. There is no appreciable fracture. There are no blastic or lytic bone lesions. Soft tissues and spinal canal: The prevertebral soft tissues and predental space regions are normal. There is no paraspinous lesion. No cord canal hematoma evident. Disc levels: There is severe disc space narrowing at C5-6 and C6-7. There is moderate disc space narrowing at C7-T1. There is slightly milder disc space narrowing at C4-5. There is multilevel facet osteoarthritic change. There is exit foraminal narrowing due to bony hypertrophy at C5-6 and C6-7 bilaterally. No frank disc extrusion or stenosis. Upper chest: Visualized upper lung zones are clear. Other: Mild calcification is noted in the right carotid artery. 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 No acute infarct. No mass or hemorrhage. There are foci of  arterial vascular calcification. There is mucosal thickening in several ethmoid air cells. CT cervical spine: No evident fracture. Slight spondylolisthesis at several levels is due to underlying spondylosis. There is spondylosis/osteoarthritic changes several levels. No frank disc extrusion or stenosis. Slight calcification noted in the right carotid artery. Electronically Signed   By: Bretta Bang III M.D.   On: 01/23/2018 14:14   Ct Chest Wo Contrast  Result Date: 01/23/2018 CLINICAL DATA:  Possible rib fracture post trauma. EXAM: CT CHEST WITHOUT CONTRAST TECHNIQUE: Multidetector CT imaging of the chest was performed following the standard protocol without IV contrast. COMPARISON:  08/25/2007 and chest x-ray 09/05/2011 FINDINGS: Cardiovascular: Borderline cardiomegaly. Calcified plaque over the left anterior descending coronary artery. Mild calcified plaque over the thoracic aorta. Remaining vascular structures are within normal. Mediastinum/Nodes: Several shotty mediastinal lymph nodes are present there is no mediastinal or hilar adenopathy. Remaining mediastinal structures are within normal. Lungs/Pleura: Lungs are well inflated with subtle posterior dependent atelectasis. No lobar consolidation or effusion. Airways are normal. Upper Abdomen: No acute findings. Mild calcified plaque over the abdominal aorta. Musculoskeletal: Old fifth posterolateral rib fracture. No acute rib fractures. Mild degenerate change of the spine. Compression fracture over the thoracolumbar  junction unchanged. IMPRESSION: No acute rib fracture. No acute cardiopulmonary disease. Aortic Atherosclerosis (ICD10-I70.0). Minimal atherosclerotic coronary artery disease. Mild stable compression fracture over the thoracolumbar junction. Electronically Signed   By: Elberta Fortis M.D.   On: 01/23/2018 14:53   Ct Cervical Spine Wo Contrast  Result Date: 01/23/2018 CLINICAL DATA:  Altered level of consciousness with questionable fall  EXAM: CT HEAD WITHOUT CONTRAST CT CERVICAL SPINE WITHOUT CONTRAST TECHNIQUE: Multidetector CT imaging of the head and cervical spine was performed following the standard protocol without intravenous contrast. Multiplanar CT image reconstructions of the cervical spine were also generated. COMPARISON:  Head CT December 23, 2016 FINDINGS: CT HEAD FINDINGS Brain: Relatively mild diffuse atrophy is stable. There is no intracranial mass, hemorrhage, extra-axial fluid collection, or midline shift. There is small vessel disease throughout the centra semiovale bilaterally as well as in the anterior and posterior limbs of each internal capsule. There is no new gray-white compartment lesion. No acute infarct is demonstrable on this study. Vascular: There is no appreciable hyperdense vessel. There is calcification in each carotid siphon. Skull: Bony calvarium appears intact. Sinuses/Orbits: There is mucosal thickening in multiple ethmoid air cells. Other visualized paranasal sinuses are clear. Orbits appear symmetric bilaterally except for previous cataract removal on the right. Other: Mastoid air cells are clear. CT CERVICAL SPINE FINDINGS Alignment: There is 1 mm of anterolisthesis of C3 on C4. There is 1 mm of anterolisthesis of C4 on C5. There is 1 mm of retrolisthesis of C5 on C6. No other spondylolisthesis evident. Skull base and vertebrae: Skull base and craniocervical junction regions appear normal. There is pannus posterior to the odontoid, not causing appreciable impression on the craniocervical junction region. There is no appreciable fracture. There are no blastic or lytic bone lesions. Soft tissues and spinal canal: The prevertebral soft tissues and predental space regions are normal. There is no paraspinous lesion. No cord canal hematoma evident. Disc levels: There is severe disc space narrowing at C5-6 and C6-7. There is moderate disc space narrowing at C7-T1. There is slightly milder disc space narrowing at C4-5.  There is multilevel facet osteoarthritic change. There is exit foraminal narrowing due to bony hypertrophy at C5-6 and C6-7 bilaterally. No frank disc extrusion or stenosis. Upper chest: Visualized upper lung zones are clear. Other: Mild calcification is noted in the right carotid artery. 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 No acute infarct. No mass or hemorrhage. There are foci of arterial vascular calcification. There is mucosal thickening in several ethmoid air cells. CT cervical spine: No evident fracture. Slight spondylolisthesis at several levels is due to underlying spondylosis. There is spondylosis/osteoarthritic changes several levels. No frank disc extrusion or stenosis. Slight calcification noted in the right carotid artery. Electronically Signed   By: Bretta Bang III M.D.   On: 01/23/2018 14:14   Ct Abdomen Pelvis W Contrast  Result Date: 01/23/2018 CLINICAL DATA:  77 y/o  F; fall 5 days ago with back pain. EXAM: CT ABDOMEN AND PELVIS WITH CONTRAST TECHNIQUE: Multidetector CT imaging of the abdomen and pelvis was performed using the standard protocol following bolus administration of intravenous contrast. CONTRAST:  ISOVUE-300 IOPAMIDOL (ISOVUE-300) INJECTION 61% COMPARISON:  08/25/2007 CT abdomen and pelvis. FINDINGS: Lower chest: No acute abnormality. Hepatobiliary: No focal liver abnormality is seen. No gallstones or gallbladder wall thickening. No intrahepatic biliary ductal dilatation. The common bile duct measures up to 9 mm (series 3, image 28) and stable. Pancreas: Unremarkable. No pancreatic ductal dilatation  or surrounding inflammatory changes. Spleen: Normal in size without focal abnormality. Adrenals/Urinary Tract: Adrenal glands are unremarkable. Kidneys are normal, without renal calculi, focal lesion, or hydronephrosis. Bladder is unremarkable. Stomach/Bowel: Stomach is within normal limits. Appendix appears normal. Duodenal  diverticulum measuring 18 mm arising from the second second of the duodenum adjacent to the common bile duct. No evidence of bowel wall thickening, distention, or inflammatory changes. Vascular/Lymphatic: Aortic atherosclerosis. No enlarged abdominal or pelvic lymph nodes. Reproductive: Status post hysterectomy. No adnexal masses. Other: No abdominal wall hernia or abnormality. No abdominopelvic ascites. Musculoskeletal: T12 mild anterior compression deformity is stable. No acute fracture identified. IMPRESSION: 1. No acute process identified. 2. 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 3. Aortic atherosclerosis. 4. Stable mild T12 compression deformity. Electronically Signed   By: Mitzi Hansen M.D.   On: 01/23/2018 19:27   Dg Chest Port 1 View  Result Date: 01/25/2018 CLINICAL DATA:  Shortness of breath. EXAM: PORTABLE CHEST 1 VIEW COMPARISON:  Radiographs of September 05, 2011. FINDINGS: The heart size and mediastinal contours are within normal limits. Both lungs are clear. No pneumothorax or pleural effusion is noted. The visualized skeletal structures are unremarkable. IMPRESSION: No acute cardiopulmonary abnormality seen. Electronically Signed   By: Lupita Raider, M.D.   On: 01/25/2018 09:33      Subjective: She is feeling better, more energy. Denies dyspnea, or chest pain.    Discharge Exam: Vitals:   01/26/18 1012 01/26/18 1125  BP: (!) 128/101 (!) 146/91  Pulse: 66 68  Resp:  18  Temp:  98.2 F (36.8 C)  SpO2:  96%   Vitals:   01/25/18 2029 01/26/18 0500 01/26/18 1012 01/26/18 1125  BP: 137/82  (!) 128/101 (!) 146/91  Pulse: 70  66 68  Resp: 19   18  Temp: 98.2 F (36.8 C)   98.2 F (36.8 C)  TempSrc: Oral   Oral  SpO2: 98%   96%  Weight:  89.8 kg    Height:        General: Pt is alert, awake, not in acute distress Cardiovascular: RRR, S1/S2 +, no rubs, no gallops Respiratory: CTA bilaterally, no wheezing, no rhonchi Abdominal: Soft,  NT, ND, bowel sounds + Extremities: no edema, no cyanosis    The results of significant diagnostics from this hospitalization (including imaging, microbiology, ancillary and laboratory) are listed below for reference.     Microbiology: No results found for this or any previous visit (from the past 240 hour(s)).   Labs: BNP (last 3 results) No results for input(s): BNP in the last 8760 hours. Basic Metabolic Panel: Recent Labs  Lab 01/23/18 1251 01/24/18 0559  NA 143 143  K 4.3 4.5  CL 104 107  CO2 29 29  GLUCOSE 108* 107*  BUN 18 17  CREATININE 0.92 0.85  CALCIUM 9.7 9.1   Liver Function Tests: Recent Labs  Lab 01/23/18 1251  AST 20  ALT 19  ALKPHOS 72  BILITOT 0.4  PROT 6.7  ALBUMIN 3.5   No results for input(s): LIPASE, AMYLASE in the last 168 hours. No results for input(s): AMMONIA in the last 168 hours. CBC: Recent Labs  Lab 01/23/18 1251 01/24/18 0559  WBC 5.6 5.8  NEUTROABS 3.0  --   HGB 14.0 13.2  HCT 44.3 40.7  MCV 97.8 97.1  PLT 144* 133*   Cardiac Enzymes: Recent Labs  Lab 01/23/18 2112 01/25/18 0846 01/25/18 1418 01/25/18 2126  CKTOTAL 48  --   --   --  TROPONINI  --  <0.03 <0.03 <0.03   BNP: Invalid input(s): POCBNP CBG: Recent Labs  Lab 01/23/18 1317 01/24/18 0505 01/24/18 0827 01/26/18 0646  GLUCAP 101* 93 135* 123*   D-Dimer No results for input(s): DDIMER in the last 72 hours. Hgb A1c No results for input(s): HGBA1C in the last 72 hours. Lipid Profile No results for input(s): CHOL, HDL, LDLCALC, TRIG, CHOLHDL, LDLDIRECT in the last 72 hours. Thyroid function studies Recent Labs    01/23/18 2112  TSH 1.375   Anemia work up Recent Labs    01/25/18 0636  VITAMINB12 3,066*   Urinalysis    Component Value Date/Time   COLORURINE YELLOW 01/23/2018 1446   APPEARANCEUR CLEAR 01/23/2018 1446   LABSPEC 1.018 01/23/2018 1446   PHURINE 5.0 01/23/2018 1446   GLUCOSEU NEGATIVE 01/23/2018 1446   HGBUR MODERATE (A)  01/23/2018 1446   BILIRUBINUR NEGATIVE 01/23/2018 1446   KETONESUR NEGATIVE 01/23/2018 1446   PROTEINUR NEGATIVE 01/23/2018 1446   UROBILINOGEN 0.2 08/25/2007 1851   NITRITE NEGATIVE 01/23/2018 1446   LEUKOCYTESUR NEGATIVE 01/23/2018 1446   Sepsis Labs Invalid input(s): PROCALCITONIN,  WBC,  LACTICIDVEN Microbiology No results found for this or any previous visit (from the past 240 hour(s)).   Time coordinating discharge: 35 minutes.   SIGNED:   Alba Cory, MD  [facility] 01/26/2018, 2:40 PM Pager [phone]  If 7PM-7AM, please contact night-coverage [email or web address] Password [email or web address]

## 2018-01-26 NOTE — Progress Notes (Signed)
Pt c/o of strong urine smell. Wants to check urine. Informed MD, verbal order given for UA.  Trent Theisen, RN

## 2018-01-26 NOTE — Progress Notes (Signed)
Patient had BM,UA and urinalysis send.   Kawanna Christley, RN

## 2018-01-26 NOTE — Progress Notes (Signed)
Physical Therapy Treatment Patient Details Name: Stevenson ClinchGwendolyn L Edrington MRN: [id number] DOB: 12/24/1940 Today's Date: 01/26/2018    History of Present Illness Pt is a 77 y.o. female admitted 01/23/18 with recurrent falls and back pain since a fall several days ago. CT notable for T12 compression fx. PMH includes CHF, HTN, depression.    PT Comments    Pt improving activity tolerance and able to perform gait x 200' with RW today. Pt continues to require occasional min A for LOB when distracted or when negotiating obstacles with RW.   Follow Up Recommendations  SNF;Supervision for mobility/OOB     Equipment Recommendations  Rolling walker with 5" wheels    Recommendations for Other Services       Precautions / Restrictions Precautions Precautions: Fall;Back Restrictions Weight Bearing Restrictions: No    Mobility  Bed Mobility Overal bed mobility: Modified Independent             General bed mobility comments: mod I with use of bedrails  Transfers   Equipment used: Rolling walker (2 wheeled) Transfers: Sit to/from Stand Sit to Stand: Supervision         General transfer comment: supervision, cues for UE placement  Ambulation/Gait Ambulation/Gait assistance: Min assist Gait Distance (Feet): 200 Feet Assistive device: Rolling walker (2 wheeled)       General Gait Details: session focused on gait in distracting environment with obstacle negotiation.  pt requires occasional min A for LOB when turning head and when distracted. supervision for gait in quiet environment   Stairs             Wheelchair Mobility    Modified Rankin (Stroke Patients Only)       Balance                                            Cognition Arousal/Alertness: Awake/alert Behavior During Therapy: Impulsive                                   General Comments: Very pleasant and talkative. Internally distractable and required redirecting.        Exercises      General Comments        Pertinent Vitals/Pain Pain Assessment: No/denies pain    Home Living                      Prior Function            PT Goals (current goals can now be found in the care plan section) Progress towards PT goals: Progressing toward goals    Frequency    Min 3X/week      PT Plan      Co-evaluation              AM-PAC PT "6 Clicks" Daily Activity  Outcome Measure  Difficulty turning over in bed (including adjusting bedclothes, sheets and blankets)?: None Difficulty moving from lying on back to sitting on the side of the bed? : None Difficulty sitting down on and standing up from a chair with arms (e.g., wheelchair, bedside commode, etc,.)?: A Little Help needed moving to and from a bed to chair (including a wheelchair)?: A Little Help needed walking in hospital room?: A Little Help needed climbing 3-5 steps with a railing? :  A Little 6 Click Score: 20    End of Session Equipment Utilized During Treatment: Gait belt Activity Tolerance: Patient tolerated treatment well Patient left: in bed;with call bell/phone within reach;with bed alarm set;with family/visitor present Nurse Communication: Mobility status PT Visit Diagnosis: Other abnormalities of gait and mobility (R26.89);Repeated falls (R29.6);Muscle weakness (generalized) (M62.81)     Time: [id number] PT Time Calculation (min) (ACUTE ONLY): 26 min  Charges:  $Gait Training: 23-37 mins                    Reggy EyeKaren Lile Mccurley, PT, DPT   Ayaz Sondgeroth 01/26/2018, 2:11 PM

## 2018-01-26 NOTE — Evaluation (Addendum)
Occupational Therapy Evaluation Patient Details Name: Kari ClinchGwendolyn L Young MRN: [id number] DOB: September 27, 1940 Today's Date: 01/26/2018    History of Present Illness Pt is a 77 y.o. female admitted 01/23/18 with recurrent falls and back pain since a fall several days ago. CT notable for T12 compression fx. PMH includes CHF, HTN, depression.   Clinical Impression   PTA, pt was living with her husband and was performing ADLs and daughter was performing IADLs. Pt currently requiring Min Guard A for safety during ADLs and functional mobility. Pt presenting with decreased strength, balance, and activity tolerance. Providing education on compensatory techniques for LB ADLs and adherence to back precautions. Pt denies any dizziness throughout session Pt would benefit from further acute OT to facilitate safe dc. Recommend dc to SNF for further ST-rehab and OT to optimize safety, independence with ADLs, and return to PLOF.      Follow Up Recommendations  SNF    Equipment Recommendations  None recommended by OT    Recommendations for Other Services PT consult     Precautions / Restrictions Precautions Precautions: Fall;Back Precaution Booklet Issued: Yes (comment) Precaution Comments: T12 compression fx, educated on precautions  Restrictions Weight Bearing Restrictions: No      Mobility Bed Mobility Overal bed mobility: Needs Assistance Bed Mobility: Sit to Supine       Sit to supine: Supervision;HOB elevated   General bed mobility comments: supervision for safety.   Transfers Overall transfer level: Needs assistance Equipment used: Rolling walker (2 wheeled) Transfers: Sit to/from Stand Sit to Stand: Min guard         General transfer comment: Min Guard A for safety    Balance Overall balance assessment: Needs assistance   Sitting balance-Leahy Scale: Fair       Standing balance-Leahy Scale: Fair                             ADL either performed or assessed  with clinical judgement   ADL Overall ADL's : Needs assistance/impaired Eating/Feeding: Set up;Supervision/ safety;Sitting   Grooming: Min guard;Wash/dry hands;Standing Grooming Details (indicate cue type and reason): Min Guard A for safety Upper Body Bathing: Min guard;Standing   Lower Body Bathing: Min guard;Sit to/from stand   Upper Body Dressing : Set up;Supervision/safety;Sitting   Lower Body Dressing: Min guard;Sit to/from stand Lower Body Dressing Details (indicate cue type and reason): Educating pt on compensatory techniques for adherance to back precautions. Pt able to bring her ankles to her knees for donning/doffing socks Toilet Transfer: Min guard;Ambulation;Regular Teacher, adult educationToilet Toilet Transfer Details (indicate cue type and reason): Pt coming out of bathroom upon arrival with RN present         Functional mobility during ADLs: Min guard;Rolling walker General ADL Comments: Pt requiring Min Guard A for safety during ADLs and funcitonal mobility. Providing education on compensatory techniques for back precautions. Pt verbalizing understanding.      Vision Baseline Vision/History: Wears glasses Patient Visual Report: No change from baseline       Perception     Praxis      Pertinent Vitals/Pain Pain Assessment: Faces Faces Pain Scale: Hurts a little bit Pain Location: Back Pain Descriptors / Indicators: Guarding;Sore Pain Intervention(s): Monitored during session;Limited activity within patient's tolerance;Repositioned     Hand Dominance Right   Extremity/Trunk Assessment Upper Extremity Assessment Upper Extremity Assessment: Overall WFL for tasks assessed   Lower Extremity Assessment Lower Extremity Assessment: Generalized weakness   Cervical / Trunk  Assessment Cervical / Trunk Assessment: Other exceptions Cervical / Trunk Exceptions: T12 compression fx   Communication Communication Communication: No difficulties   Cognition Arousal/Alertness:  Awake/alert Behavior During Therapy: WFL for tasks assessed/performed Overall Cognitive Status: Within Functional Limits for tasks assessed                                 General Comments: Very pleasant and talkative. Internally distractable and required redirecting.    General Comments  Daughter present throughout session. SpO2 in 90s on RA    Exercises     Shoulder Instructions      Home Living Family/patient expects to be discharged to:: Private residence Living Arrangements: Spouse/significant other Available Help at Discharge: Family;Available 24 hours/day Type of Home: House Home Access: Stairs to enter [employer] of Steps: 4 Entrance Stairs-Rails: None Home Layout: Multi-level;Able to live on main level with bedroom/bathroom     Bathroom Shower/Tub: Tub/shower unit   Bathroom Toilet: Standard(Toilet riser)     Home Equipment: Shower seat;Grab bars - tub/shower;Toilet riser          Prior Functioning/Environment Level of Independence: Independent        Comments: Reports significant amount of falls the past ~3 years due to "dizziness"        OT Problem List: Decreased strength;Decreased range of motion;Decreased activity tolerance;Impaired balance (sitting and/or standing);Decreased safety awareness;Decreased knowledge of use of DME or AE;Decreased knowledge of precautions;Pain      OT Treatment/Interventions: Self-care/ADL training;Therapeutic exercise;Energy conservation;DME and/or AE instruction;Therapeutic activities;Patient/family education    OT Goals(Current goals can be found in the care plan section) Acute Rehab OT Goals Patient Stated Goal: Stop falling so much OT Goal Formulation: With patient Time For Goal Achievement: 02/09/18 Potential to Achieve Goals: Good ADL Goals Pt Will Perform Lower Body Dressing: with modified independence;sit to/from stand(adhering to back precautions) Pt Will Transfer to Toilet: with  modified independence;ambulating;regular height toilet Pt Will Perform Toileting - Clothing Manipulation and hygiene: with modified independence;sit to/from stand Additional ADL Goal #1: Pt will perform log roll technique for bed mobility with supervision in preparation for ADLs  OT Frequency: Min 2X/week   Barriers to D/C:            Co-evaluation              AM-PAC PT "6 Clicks" Daily Activity     Outcome Measure Help from another person eating meals?: None Help from another person taking care of personal grooming?: A Little Help from another person toileting, which includes using toliet, bedpan, or urinal?: A Little Help from another person bathing (including washing, rinsing, drying)?: A Little Help from another person to put on and taking off regular upper body clothing?: A Little Help from another person to put on and taking off regular lower body clothing?: A Little 6 Click Score: 19   End of Session Equipment Utilized During Treatment: Gait belt;Rolling walker Nurse Communication: Mobility status;Precautions  Activity Tolerance: Patient tolerated treatment well Patient left: in bed;with call bell/phone within reach;with bed alarm set;with family/visitor present  OT Visit Diagnosis: Unsteadiness on feet (R26.81);Other abnormalities of gait and mobility (R26.89);Muscle weakness (generalized) (M62.81);Pain Pain - part of body: (Back)                Time: 1610-9604 OT Time Calculation (min): 21 min Charges:  OT General Charges $OT Visit: 1 Visit OT Evaluation $OT Eval Moderate Complexity: 1 Mod  Xitlaly Ault MSOT, OTR/L Acute Rehab Pager: [phone] Office: [phone]  Theodoro GristCharis M Gautam Langhorst 01/26/2018, 10:09 AM

## 2018-01-26 NOTE — Progress Notes (Signed)
[facility]. Awaiting on patient to have BM in order to be discharged.  Kimbella Heisler, RN

## 2018-01-26 NOTE — Plan of Care (Signed)
°  Problem: Clinical Measurements: °Goal: Ability to maintain clinical measurements within normal limits will improve °Outcome: Progressing °  °Problem: Activity: °Goal: Risk for activity intolerance will decrease °Outcome: Progressing °  °Problem: Nutrition: °Goal: Adequate nutrition will be maintained °Outcome: Progressing °  °

## 2018-01-27 ENCOUNTER — Non-Acute Institutional Stay (SKILLED_NURSING_FACILITY): Payer: Medicare HMO | Admitting: Internal Medicine

## 2018-01-27 DIAGNOSIS — R42 Dizziness and giddiness: Secondary | ICD-10-CM

## 2018-01-27 DIAGNOSIS — I10 Essential (primary) hypertension: Secondary | ICD-10-CM

## 2018-01-27 DIAGNOSIS — S22000G Wedge compression fracture of unspecified thoracic vertebra, subsequent encounter for fracture with delayed healing: Secondary | ICD-10-CM

## 2018-01-27 DIAGNOSIS — R001 Bradycardia, unspecified: Secondary | ICD-10-CM

## 2018-01-27 DIAGNOSIS — I5032 Chronic diastolic (congestive) heart failure: Secondary | ICD-10-CM

## 2018-01-27 DIAGNOSIS — R55 Syncope and collapse: Secondary | ICD-10-CM

## 2018-01-28 ENCOUNTER — Encounter: Payer: Self-pay | Admitting: *Deleted

## 2018-01-28 ENCOUNTER — Non-Acute Institutional Stay (SKILLED_NURSING_FACILITY): Payer: Medicare HMO | Admitting: Internal Medicine

## 2018-01-28 DIAGNOSIS — R42 Dizziness and giddiness: Secondary | ICD-10-CM | POA: Diagnosis not present

## 2018-01-28 DIAGNOSIS — I10 Essential (primary) hypertension: Secondary | ICD-10-CM

## 2018-01-28 DIAGNOSIS — R0789 Other chest pain: Secondary | ICD-10-CM

## 2018-01-28 DIAGNOSIS — S22000A Wedge compression fracture of unspecified thoracic vertebra, initial encounter for closed fracture: Secondary | ICD-10-CM | POA: Diagnosis not present

## 2018-01-28 DIAGNOSIS — R55 Syncope and collapse: Secondary | ICD-10-CM

## 2018-01-28 DIAGNOSIS — R001 Bradycardia, unspecified: Secondary | ICD-10-CM

## 2018-01-28 DIAGNOSIS — I5032 Chronic diastolic (congestive) heart failure: Secondary | ICD-10-CM | POA: Diagnosis not present

## 2018-01-28 NOTE — Progress Notes (Addendum)
Provider:  Dr. Einar CrowAnjali Shamon Lobo Location:  [facility] Room Number: [phone] Place of Service:  SNF (31)  PCP: Dorisann FramesStallings, Davey, MD Patient Care Team: Dorisann FramesStallings, Davey, MD as PCP - General Laqueta LindenKoneswaran, Suresh A, MD as PCP - Cardiology (Cardiology)  Extended Emergency Contact Information Primary Emergency Contact: Vaden,Denise Address: [address]          [place] [id number] [place] Home Phone: [phone] Relation: Daughter  Code Status: Full Code Goals of Care: Advanced Directive information Advanced Directives 01/23/2018  Does Patient Have a Medical Advance Directive? No  Would patient like information on creating a medical advance directive? Yes (MAU/Ambulatory/Procedural Areas - Information given);No - Patient declined      Chief Complaint  Patient presents with  . New Admit To SNF    New Admit to [facility]    HPI: Patient is a 77 y.o. female [facility] after staying in the hospital from 08/17-08/19 for Bradycardia and Fall.  Patient has h/o Diastolic CHF, Hypertension, Vertigo and Depression. Patient was brought to the emergency room with complaint of lightheadedness and recurrent falls.  Patient states that she has been having falls for last few years due to ? Vertigo but recently it has been getting worse.   In the emergency room she was found to have a bradycardia with heart rate in  mid 40s.  Head CT and cervical spine CT did not show any acute fracture. Her Metoprolol was stopped due to Bradycardia. She was seen by Cardiology for Chest pain and Bradycardia. Her Troponin were negative and her Echo Showed no WMA.  Patient got very anxious today as her BP was elevated with Therapy. She also wants to know if she can rather go home then stay here in SNF. Denies any Chest Pain or SOB   Past Medical History:  Diagnosis Date  . Hypertension   . MRSA infection    Past Surgical  History:  Procedure Laterality Date  . ABDOMINAL HYSTERECTOMY    . APPENDECTOMY    . NASAL SINUS SURGERY      reports that she has never smoked. She has never used smokeless tobacco. She reports that she does not drink alcohol or use drugs. Social History   Socioeconomic History  . Marital status: Married    Spouse name: Not on file  . Number of children: Not on file  . Years of education: Not on file  . Highest education level: Not on file  Occupational History  . Not on file  Social Needs  . Financial resource strain: Not on file  . Food insecurity:    Worry: Not on file    Inability: Not on file  . Transportation needs:    Medical: Not on file    Non-medical: Not on file  Tobacco Use  . Smoking status: Never Smoker  . Smokeless tobacco: Never Used  Substance and Sexual Activity  . Alcohol use: No  . Drug use: Never  . Sexual activity: Not on file  Lifestyle  . Physical activity:    Days per week: Not on file    Minutes per session: Not on file  . Stress: Not on file  Relationships  . Social connections:    Talks on phone: Not on file    Gets together: Not on file    Attends religious service: Not on file    Active member of club or organization: Not  on file    Attends meetings of clubs or organizations: Not on file    Relationship status: Not on file  . Intimate partner violence:    Fear of current or ex partner: Not on file    Emotionally abused: Not on file    Physically abused: Not on file    Forced sexual activity: Not on file  Other Topics Concern  . Not on file  Social History Narrative  . Not on file    Functional Status Survey:    Family History  Problem Relation Age of Onset  . Sudden Cardiac Death Neg Hx     Health Maintenance  Topic Date Due  . TETANUS/TDAP  02/25/1960  . DEXA SCAN  02/24/2006  . PNA vac Low Risk Adult (1 of 2 - PCV13) 02/24/2006  . INFLUENZA VACCINE  01/07/2018    Allergies  Allergen Reactions  . Percocet  [Oxycodone-Acetaminophen] Itching  . Sulfamethoxazole Nausea Only    Outpatient Encounter Medications as of 01/28/2018  Medication Sig  . Ascorbic Acid (VITAMIN C PO) Take 1 tablet by mouth daily.  . cephALEXin (KEFLEX) 500 MG capsule Take 1 capsule (500 mg total) by mouth every 12 (twelve) hours.  . Cholecalciferol 1000 UNITS tablet Take 1,000 Units by mouth daily.  Marland Kitchen conjugated estrogens (PREMARIN) vaginal cream Place 1 Applicatorful vaginally every Monday, Wednesday, and Friday.  . Cyanocobalamin (VITAMIN B-12 PO) Take 1 tablet by mouth daily.  Marland Kitchen esomeprazole (NEXIUM) 40 MG capsule Take 40 mg by mouth daily. Reported on 10/08/2015  . fexofenadine (ALLEGRA) 180 MG tablet Take 180 mg by mouth daily.  . furosemide (LASIX) 20 MG tablet Take 20 mg by mouth.  Marland Kitchen HYDROcodone-acetaminophen (NORCO/VICODIN) 5-325 MG tablet Take 1-2 tablets by mouth every 6 (six) hours as needed for moderate pain.  . hydrocortisone (ANUSOL-HC) 2.5 % rectal cream Place 1 application rectally every 6 (six) hours as needed for hemorrhoids or anal itching.  . magnesium oxide (MAG-OX) 400 MG tablet Take 200 mg by mouth daily.  . mirabegron ER (MYRBETRIQ) 50 MG TB24 tablet Take 50 mg by mouth daily.  . polyethylene glycol (MIRALAX / GLYCOLAX) packet Take 17 g by mouth 2 (two) times daily.  Marland Kitchen senna-docusate (SENOKOT-S) 8.6-50 MG tablet Take 1 tablet by mouth daily.  Marland Kitchen venlafaxine (EFFEXOR-XR) 150 MG 24 hr capsule Take 150 mg by mouth daily.  . metoprolol tartrate (LOPRESSOR) 25 MG tablet Take 0.5 tablets (12.5 mg total) by mouth 2 (two) times daily. (Patient not taking: Reported on 01/28/2018)   No facility-administered encounter medications on file as of 01/28/2018.     Review of Systems  Review of Systems  Constitutional: Negative for activity change, appetite change, chills, diaphoresis, fatigue and fever.  HENT: Negative for mouth sores, postnasal drip, rhinorrhea, sinus pain and sore throat.   Respiratory: Negative for  apnea, cough, chest tightness, shortness of breath and wheezing.   Cardiovascular: Negative for chest pain, palpitations and leg swelling.  Gastrointestinal: Negative for abdominal distention, abdominal pain, constipation, diarrhea, nausea and vomiting.  Genitourinary: Negative for dysuria and frequency.  Musculoskeletal: Negative for arthralgias, joint swelling and myalgias.  Skin: Negative for rash.  Neurological: Negative for dizziness, syncope, weakness, light-headedness and numbness.  Psychiatric/Behavioral: Negative for behavioral problems, confusion and sleep disturbance.      Vitals:   01/28/18 0948  BP: (!) 143/65  Pulse: 67  Resp: 18  Temp: 98.2 F (36.8 C)  TempSrc: Oral  Weight: 199 lb 9.6 oz (90.5 kg)  Height: 5\' 3"  (1.6 m)   Body mass index is 35.36 kg/m. Physical Exam  Constitutional: Oriented to person, place, and time. Well-developed and well-nourished.  HENT:  Head: Normocephalic.  Mouth/Throat: Oropharynx is clear and moist.  Eyes: Pupils are equal, round, and reactive to light.  Neck: Neck supple.  Cardiovascular: Normal rate and normal heart sounds.  No murmur heard. Pulmonary/Chest: Effort normal and breath sounds normal. No respiratory distress. No wheezes. She has no rales.  Abdominal: Soft. Bowel sounds are normal. No distension. There is no tenderness. There is no rebound.  Musculoskeletal: Trace edema bilateral Lymphadenopathy: none Neurological: Alert and oriented to person, place, and time.  Skin: Skin is warm and dry.  Psychiatric: Normal mood and affect. Behavior is normal. Thought content normal.    Labs reviewed: Basic Metabolic Panel: Recent Labs    01/23/18 1251 01/24/18 0559  NA 143 143  K 4.3 4.5  CL 104 107  CO2 29 29  GLUCOSE 108* 107*  BUN 18 17  CREATININE 0.92 0.85  CALCIUM 9.7 9.1   Liver Function Tests: Recent Labs    01/23/18 1251  AST 20  ALT 19  ALKPHOS 72  BILITOT 0.4  PROT 6.7  ALBUMIN 3.5   No  results for input(s): LIPASE, AMYLASE in the last 8760 hours. No results for input(s): AMMONIA in the last 8760 hours. CBC: Recent Labs    01/23/18 1251 01/24/18 0559  WBC 5.6 5.8  NEUTROABS 3.0  --   HGB 14.0 13.2  HCT 44.3 40.7  MCV 97.8 97.1  PLT 144* 133*   Cardiac Enzymes: Recent Labs    01/23/18 2112 01/25/18 0846 01/25/18 1418 01/25/18 2126  CKTOTAL 48  --   --   --   TROPONINI  --  <0.03 <0.03 <0.03   BNP: Invalid input(s): POCBNP No results found for: HGBA1C Lab Results  Component Value Date   TSH 1.375 01/23/2018   Lab Results  Component Value Date   VITAMINB12 3,066 (H) 01/25/2018   No results found for: FOLATE No results found for: IRON, TIBC, FERRITIN  Imaging and Procedures obtained prior to SNF admission: No results found.  Assessment/Plan Postural dizziness with near syncope Patient denies any symptoms of dizziness Will start therapy  Essential hypertension BP mildly elevated . 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  She wants to go home in next few days. D/W Discharge Nurse.     Family/ staff Communication:   Labs/tests ordered:

## 2018-01-29 LAB — URINE CULTURE

## 2018-01-31 ENCOUNTER — Encounter: Payer: Self-pay | Admitting: Internal Medicine

## 2018-01-31 NOTE — Progress Notes (Signed)
This is an acute visit.  Level of care skilled.  Facility is [facility].  Chief complaint-acute visit status post hospitalization for dizziness with bradycardia and increased weakness.  History of present illness.  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  In the ER she was found to be bradycardic with pulse in the low to mid 40s.  EKG showed sinus bradycardia with a rate of 42 PVCs and left ventricular hypertrophy with repolarization abnormality.  CT of the head showed atrophy but no acute process.  Cervical spine CT showed chronic findings  CT of the abdomen and pelvis did not show any acute process but did show T12 compression fracture.  She was given IV fluids in the ED and blood pressure remained stable.  Her high-dose metoprolol was discontinued and her pulse rate did improve- it appears on discharge there was a possible recommendation to restart Metroprolol at 12.5 mg twice daily- but that this point pulse appears to be controlled blood pressure is mildly elevated but this will have to be watched.  She did complain of chest pain in the ER but EKG did not show any acute process enzymes were negative.  Regards to hypertension again her blood pressure did start to rise apparently before discharge and recommendation was to restart low-dose metoprolol- she is on Lasix 20 mg a day as well.  In regards to her compression fracture-she has been started on Norco.  Currently she is resting in bed comfortably and does not really have any complaints does not complain of dizziness or syncope  I do note she is on Keflex for suspected UTI- cultures are pending    Past Medical History:  Diagnosis  Date  . Hypertension   . MRSA infection         Past Surgical History:  Procedure Laterality Date  . ABDOMINAL HYSTERECTOMY    . APPENDECTOMY    . NASAL SINUS SURGERY      reports that she has never smoked. She has never used smokeless tobacco. She reports that she does not drink alcohol or use drugs. Social History        Socioeconomic History  . Marital status: Married    Spouse name: Not on file  . Number of children: Not on file  . Years of education: Not on file  . Highest education level: Not on file  Occupational History  . Not on file  Social Needs  . Financial resource strain: Not on file  . Food insecurity:    Worry: Not on file    Inability: Not on file  . Transportation needs:    Medical: Not on file    Non-medical: Not on file  Tobacco Use  . Smoking status: Never Smoker  . Smokeless tobacco: Never Used  Substance and Sexual Activity  . Alcohol use: No  . Drug use: Never  . Sexual activity: Not on file  Lifestyle  . Physical activity:    Days per week: Not on file    Minutes per session: Not on file  . Stress: Not on file  Relationships  . Social connections:    Talks on phone: Not on file    Gets together: Not on file    Attends religious service:  Not on file    Active member of club or organization: Not on file    Attends meetings of clubs or organizations: Not on file    Relationship status: Not on file  . Intimate partner violence:    Fear of current or ex partner: Not on file    Emotionally abused: Not on file    Physically abused: Not on file    Forced sexual activity: Not on file  Other Topics Concern  . Not on file  Social History Narrative  . Not on file    Functional Status Survey:       Family History  Problem Relation Age of Onset  . Sudden Cardiac Death Neg Hx         Health Maintenance  Topic Date Due  . TETANUS/TDAP  02/25/1960  . DEXA SCAN  02/24/2006  . PNA vac  Low Risk Adult (1 of 2 - PCV13) 02/24/2006  . INFLUENZA VACCINE  01/07/2018        Allergies  Allergen Reactions  . Percocet [Oxycodone-Acetaminophen] Itching  . Sulfamethoxazole Nausea Only      MEDICATIONS     Medication Sig  . Ascorbic Acid (VITAMIN C PO) Take 1 tablet by mouth daily.  . cephALEXin (KEFLEX) 500 MG capsule Take 1 capsule (500 mg total) by mouth every 12 (twelve) hours.  . Cholecalciferol 1000 UNITS tablet Take 1,000 Units by mouth daily.  Marland Kitchen conjugated estrogens (PREMARIN) vaginal cream Place 1 Applicatorful vaginally every Monday, Wednesday, and Friday.  . Cyanocobalamin (VITAMIN B-12 PO) Take 1 tablet by mouth daily.  Marland Kitchen esomeprazole (NEXIUM) 40 MG capsule Take 40 mg by mouth daily. Reported on 10/08/2015  . fexofenadine (ALLEGRA) 180 MG tablet Take 180 mg by mouth daily.  . furosemide (LASIX) 20 MG tablet Take 20 mg by mouth.  Marland Kitchen HYDROcodone-acetaminophen (NORCO/VICODIN) 5-325 MG tablet Take 1-2 tablets by mouth every 6 (six) hours as needed for moderate pain.  . hydrocortisone (ANUSOL-HC) 2.5 % rectal cream Place 1 application rectally every 6 (six) hours as needed for hemorrhoids or anal itching.  . magnesium oxide (MAG-OX) 400 MG tablet Take 200 mg by mouth daily.  . mirabegron ER (MYRBETRIQ) 50 MG TB24 tablet Take 50 mg by mouth daily.  . polyethylene glycol (MIRALAX / GLYCOLAX) packet Take 17 g by mouth 2 (two) times daily.  Marland Kitchen senna-docusate (SENOKOT-S) 8.6-50 MG tablet Take 1 tablet by mouth daily.  Marland Kitchen venlafaxine (EFFEXOR-XR) 150 MG 24 hr capsule Take 150 mg by mouth daily.  . metoprolol tartrate (LOPRESSOR) 25 MG tablet Take 0.5 tablets (12.5 mg total) by mouth 2 (two) times daily. (Patient not taking: Reported on 01/28/2018)   No facility-administered encounter medications on file as of 01/28/2018.    Review of systems.   General she is not complain of fever or chills.  Skin is not complain of rashes or itching.  Head ears eyes nose mouth and  throat is not complaining of a sore throat or visual changes.  Respiratory does not complain of being short of breath or having increased cough.  Cardiac does not complain of chest pain.  GI does not complain of abdominal discomfort nausea vomiting diarrhea constipation she does have some history of constipation.  GU is not complaining of dysuria.   Rectal has complained of hemorrhoid discomfort and has been started on Anusol  Musculoskeletal at times will complain of back pain but is not complaining of that acutely so this evening.  Neurologic is not complain dizziness  headache or numbness.  Psych does not complain of being depressed or anxious at this time \\She  is on Effexor with a history of depression  Physical exam.    Temperature is 97.0 pulse 75 respirations 20 blood pressure this evening is 146/72 previously was 134/74  In general this is a pleasant elderly female in no distress lying comfortably in bed.  Her skin is warm and dry.  Eyes sclera and conjunctive are clear visual acuity appears to be intact.  Oropharynx is clear mucous membranes moist.  Chest is clear to auscultation there is no labored breathing.  Heart is regular rate and rhythm in the 70s without murmur gallop or rub.  She has minimal lower extremity edema.  Abdomen is soft nontender with positive bowel sounds.    Rectal--Patient deferred   Musculoskeletal Limited exam since she is in bed but is able to move all extremities x 4 appears to be relatively baseline  Neurologic--no lateralizing findings--speech is clear...cranial nerves intact  Psyche-appears alert and oriented--pleasant and appropriate   LABS  01/24/2018.  WBC 5.8 hemoglobin 13.2 platelets 133.  Sodium 143 potassium 4.5 BUN 17 creatinine 0.85.  Assessment and plan.  #1 bradycardia-this appears improved off the metoprolol-recommendation is possibly restart at lower dose for elevated blood pressure--   #2-hypertension-  at this point continue to monitor-- Systolics have been running in the 1 30-40 range but we have fairly minimal readings-at this point continue to monitor- consider low-dose beta-blocker or losartan or low-dose beta-blocker if  Consistently elevated  #3-history of thoracic compression fracture-she continues with Norco as needed  #4- history of diastolic CHF continues on low-dose Lasix continue to monitor weights and edema.   #5 history of hemorrhoids she does have PRN Anusol again patient deferred exam.  6.-  History of depression she continues on Effexor at this point appears stable.  7.  History of suspected urinary frequency she continues on Myrbetriq she is not complaining of frequency at this time.  She is being treated for UTI.  8.  History of UTI she is on Keflex empirically awaiting culture results.  9.  Constipation continues on MiraLAX and Senokot apparently this is stable.  [id number] note greater than 35 minutes spent assessing patient- reviewing her chart and labs-and coordinating and formulating a plan of care for numerous diagnoses--of note greater than 50% of time spent coordinating plan of care with input as noted above

## 2018-02-02 ENCOUNTER — Non-Acute Institutional Stay (SKILLED_NURSING_FACILITY): Payer: Medicare HMO | Admitting: Internal Medicine

## 2018-02-02 ENCOUNTER — Encounter: Payer: Self-pay | Admitting: Internal Medicine

## 2018-02-02 DIAGNOSIS — I10 Essential (primary) hypertension: Secondary | ICD-10-CM | POA: Diagnosis not present

## 2018-02-02 DIAGNOSIS — S22000A Wedge compression fracture of unspecified thoracic vertebra, initial encounter for closed fracture: Secondary | ICD-10-CM

## 2018-02-02 DIAGNOSIS — I5032 Chronic diastolic (congestive) heart failure: Secondary | ICD-10-CM

## 2018-02-02 DIAGNOSIS — R001 Bradycardia, unspecified: Secondary | ICD-10-CM | POA: Diagnosis not present

## 2018-02-02 NOTE — Progress Notes (Signed)
Location:    [facility] Room Number: 134/P Place of Service:  SNF [phone])  Provider: Edmon Crape PA-C  PCP: Dorisann Frames, MD Patient Care Team: Dorisann Frames, MD as PCP - General Laqueta Linden, MD as PCP - Cardiology (Cardiology)  Extended Emergency Contact Information Primary Emergency Contact: Vaden,Denise Address: [address]          [place] [address] [place] Home Phone: [phone] Relation: Daughter  Code Status: Full Code Goals of care:  Advanced Directive information Advanced Directives 02/02/2018  Does Patient Have a Medical Advance Directive? Yes  Type of Advance Directive (No Data)  Does patient want to make changes to medical advance directive? No - Patient declined  Would patient like information on creating a medical advance directive? No - Patient declined     Allergies  Allergen Reactions  . Percocet [Oxycodone-Acetaminophen] Itching  . Sulfamethoxazole Nausea Only    Chief Complaint  Patient presents with  . Discharge Note    Discharge Visit    HPI:  77 y.o. female seen today for discharge from facility after hospitalization earlier this month for bradycardia and fall-.  She has a history of diastolic CHF in addition to hypertension as well as vertigo and depression.  She initially went to the ER with lightheadedness and recurrent falls- apparently the falls and dizziness had gotten somewhat worse.  She was found to be bradycardic with heart rate in the 40s-head CT and cervical spine CT did not show any acute process  Her metoprolol was stopped because of bradycardia she was seen by cardiology for chest pain and bradycardia- troponins were negative   Initially when she came here occasionally with anxiety her blood pressure would be elevated but apparently this is stabilized.  Recent blood pressures appear to be in the 130s systolically from the information I have been able to access I  did get a systolic of 106 this evening but she had been resting in bed  Her heart rate also appears to be controlled.  I got a rate in the 60s this evening  She is on Norvasc 5 mg a day- she also is on Lasix 20 mg a day.  She is on the diuretic with a history of chronic diastolic CHF she does have a listed ejection fraction of 55%- she has lost it appears a couple pounds since her admission.    Regards to weakness she appears to have gained strength she has worked with therapy she does have Norco as needed for pain.--With a history of thoracic compression fractures thought secondary to her recurrent falls  She will need continued PT and OT as well as a CNA to help support for activities of daily living- she also would benefit from a rolling walker.  Of note she is also completed a course of Keflex for a UTI.  She will be going home with her husband who is quite supportive again will need to need therapy as well as CNA support to help with activities of daily living as well as a rolling walker  Currently she has no complaints she is resting in bed visiting with family   Past Medical History:  Diagnosis Date  . Hypertension   . MRSA infection     Past Surgical History:  Procedure Laterality Date  . ABDOMINAL HYSTERECTOMY    . APPENDECTOMY    . NASAL SINUS SURGERY        reports that she has  never smoked. She has never used smokeless tobacco. She reports that she does not drink alcohol or use drugs. Social History   Socioeconomic History  . Marital status: Married    Spouse name: Not on file  . Number of children: Not on file  . Years of education: Not on file  . Highest education level: Not on file  Occupational History  . Not on file  Social Needs  . Financial resource strain: Not on file  . Food insecurity:    Worry: Not on file    Inability: Not on file  . Transportation needs:    Medical: Not on file    Non-medical: Not on file  Tobacco Use  . Smoking status:  Never Smoker  . Smokeless tobacco: Never Used  Substance and Sexual Activity  . Alcohol use: No  . Drug use: Never  . Sexual activity: Not on file  Lifestyle  . Physical activity:    Days per week: Not on file    Minutes per session: Not on file  . Stress: Not on file  Relationships  . Social connections:    Talks on phone: Not on file    Gets together: Not on file    Attends religious service: Not on file    Active member of club or organization: Not on file    Attends meetings of clubs or organizations: Not on file    Relationship status: Not on file  . Intimate partner violence:    Fear of current or ex partner: Not on file    Emotionally abused: Not on file    Physically abused: Not on file    Forced sexual activity: Not on file  Other Topics Concern  . Not on file  Social History Narrative  . Not on file   Functional Status Survey:    Allergies  Allergen Reactions  . Percocet [Oxycodone-Acetaminophen] Itching  . Sulfamethoxazole Nausea Only    Pertinent  Health Maintenance Due  Topic Date Due  . INFLUENZA VACCINE  03/05/2018 (Originally 01/07/2018)  . DEXA SCAN  03/05/2018 (Originally 02/24/2006)  . PNA vac Low Risk Adult (1 of 2 - PCV13) 03/05/2018 (Originally 02/24/2006)    Medications: Outpatient Encounter Medications as of 02/02/2018  Medication Sig  . amLODipine (NORVASC) 5 MG tablet Take 5 mg by mouth daily.  . Ascorbic Acid (VITAMIN C PO) Take 1 tablet by mouth daily.  . Cholecalciferol 1000 UNITS tablet Take 1,000 Units by mouth daily.  Marland Kitchen conjugated estrogens (PREMARIN) vaginal cream Place 1 Applicatorful vaginally every Monday, Wednesday, and Friday.  . Cyanocobalamin (VITAMIN B-12 PO) Take 1 tablet by mouth daily.  Marland Kitchen esomeprazole (NEXIUM) 40 MG capsule Take 40 mg by mouth daily. Reported on 10/08/2015  . fexofenadine (ALLEGRA) 180 MG tablet Take 180 mg by mouth daily.  . furosemide (LASIX) 20 MG tablet Take 20 mg by mouth.  Marland Kitchen HYDROcodone-acetaminophen  (NORCO/VICODIN) 5-325 MG tablet Take 1-2 tablets by mouth every 6 (six) hours as needed for moderate pain.  . hydrocortisone (ANUSOL-HC) 2.5 % rectal cream Place 1 application rectally every 6 (six) hours as needed for hemorrhoids or anal itching.  . magnesium oxide (MAG-OX) 400 MG tablet Take 400 mg by mouth daily.   . mirabegron ER (MYRBETRIQ) 50 MG TB24 tablet Take 50 mg by mouth daily.  . polyethylene glycol (MIRALAX / GLYCOLAX) packet Take 17 g by mouth 2 (two) times daily.  . Probiotic Product (RISA-BID PROBIOTIC) TABS Take 1 tablet by mouth twice a  day.  . senna-docusate (SENOKOT-S) 8.6-50 MG tablet Take 1 tablet by mouth daily.  Marland Kitchen venlafaxine (EFFEXOR-XR) 150 MG 24 hr capsule Take 150 mg by mouth daily.  . [DISCONTINUED] cephALEXin (KEFLEX) 500 MG capsule Take 1 capsule (500 mg total) by mouth every 12 (twelve) hours.  . [DISCONTINUED] metoprolol tartrate (LOPRESSOR) 25 MG tablet Take 0.5 tablets (12.5 mg total) by mouth 2 (two) times daily. (Patient not taking: Reported on 01/28/2018)   No facility-administered encounter medications on file as of 02/02/2018.      Review of Systems   General she is not complaining of any fever chills weight appears to be stable has lost a couple pounds.  Skin is not complain of diaphoresis or itching.  Head ears eyes nose mouth and throat is not complain of visual changes or sore throat.  Respiratory denies shortness of breath or cough.  Cardiac does not complain of chest pain has some mild lower extremity edema more so in her legs-minimal edema of her feet  GI is not complaining of abdominal pain nausea vomiting diarrhea constipation.  Rectal was treated for hemorrhoids is not really complain of hemorrhoid pain tonight.  Musculoskeletal is not complaining of joint pain at this time has at times complained of this especially back pain she does receive Norco apparently with relief.  Neurologic is not complaining of dizziness headache or numbness  at this point.   psych does not complain of being anxious or depressed at this time apparently had been somewhat anxious earlier in her stay does not show evidence of that tonight  Vitals:   02/02/18 1445  BP: 130/74  Pulse: 65  Resp: 20  Temp: (!) 96.4 F (35.8 C)  TempSrc: Oral  Weight is 198 pounds- manual blood pressure this evening as noted above was 106/60 Physical Exam  In general this is a pleasant elderly female in no distress lying comfortably in bed.  Her skin is warm and dry.  Eyes visual acuity appears to be intact sclera and conjunctive are clear.  Oropharynx mucous membranes moist  Chest is clear to auscultation there is no labored breathing.  Heart is regular rate and rhythm with an occasional irregular beat- she has mild lower extremity edema --more so in her legs rather than feet.  Abdomen is soft nontender with positive bowel sounds it is somewhat obese.  Musculoskeletal does move all extremities x4 appears moves with baseline strength-somewhat limited exam since she is in bed. She appeared to do fairly well during therapy which I witnessed  Neurologic is grossly intact her speech is clear no lateralizing findings.  Psych she is largely alert and oriented very pleasant talkative     Labs reviewed: Basic Metabolic Panel: Recent Labs    01/23/18 1251 01/24/18 0559  NA 143 143  K 4.3 4.5  CL 104 107  CO2 29 29  GLUCOSE 108* 107*  BUN 18 17  CREATININE 0.92 0.85  CALCIUM 9.7 9.1   Liver Function Tests: Recent Labs    01/23/18 1251  AST 20  ALT 19  ALKPHOS 72  BILITOT 0.4  PROT 6.7  ALBUMIN 3.5   No results for input(s): LIPASE, AMYLASE in the last 8760 hours. No results for input(s): AMMONIA in the last 8760 hours. CBC: Recent Labs    01/23/18 1251 01/24/18 0559  WBC 5.6 5.8  NEUTROABS 3.0  --   HGB 14.0 13.2  HCT 44.3 40.7  MCV 97.8 97.1  PLT 144* 133*   Cardiac Enzymes: Recent  Labs    01/23/18 2112 01/25/18 0846  01/25/18 1418 01/25/18 2126  CKTOTAL 48  --   --   --   TROPONINI  --  <0.03 <0.03 <0.03   BNP: Invalid input(s): POCBNP CBG: Recent Labs    01/24/18 0505 01/24/18 0827 01/26/18 0646  GLUCAP 93 135* 123*    Procedures and Imaging Studies During Stay: Ct Head Wo Contrast  Result Date: 01/23/2018 CLINICAL DATA:  Altered level of consciousness with questionable fall EXAM: CT HEAD WITHOUT CONTRAST CT CERVICAL SPINE WITHOUT CONTRAST TECHNIQUE: Multidetector CT imaging of the head and cervical spine was performed following the standard protocol without intravenous contrast. Multiplanar CT image reconstructions of the cervical spine were also generated. COMPARISON:  Head CT December 23, 2016 FINDINGS: CT HEAD FINDINGS Brain: Relatively mild diffuse atrophy is stable. There is no intracranial mass, hemorrhage, extra-axial fluid collection, or midline shift. There is small vessel disease throughout the centra semiovale bilaterally as well as in the anterior and posterior limbs of each internal capsule. There is no new gray-white compartment lesion. No acute infarct is demonstrable on this study. Vascular: There is no appreciable hyperdense vessel. There is calcification in each carotid siphon. Skull: Bony calvarium appears intact. Sinuses/Orbits: There is mucosal thickening in multiple ethmoid air cells. Other visualized paranasal sinuses are clear. Orbits appear symmetric bilaterally except for previous cataract removal on the right. Other: Mastoid air cells are clear. CT CERVICAL SPINE FINDINGS Alignment: There is 1 mm of anterolisthesis of C3 on C4. There is 1 mm of anterolisthesis of C4 on C5. There is 1 mm of retrolisthesis of C5 on C6. No other spondylolisthesis evident. Skull base and vertebrae: Skull base and craniocervical junction regions appear normal. There is pannus posterior to the odontoid, not causing appreciable impression on the craniocervical junction region. There is no appreciable  fracture. There are no blastic or lytic bone lesions. Soft tissues and spinal canal: The prevertebral soft tissues and predental space regions are normal. There is no paraspinous lesion. No cord canal hematoma evident. Disc levels: There is severe disc space narrowing at C5-6 and C6-7. There is moderate disc space narrowing at C7-T1. There is slightly milder disc space narrowing at C4-5. There is multilevel facet osteoarthritic change. There is exit foraminal narrowing due to bony hypertrophy at C5-6 and C6-7 bilaterally. No frank disc extrusion or stenosis. Upper chest: Visualized upper lung zones are clear. Other: Mild calcification is noted in the right carotid artery. 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 No acute infarct. No mass or hemorrhage. There are foci of arterial vascular calcification. There is mucosal thickening in several ethmoid air cells. CT cervical spine: No evident fracture. Slight spondylolisthesis at several levels is due to underlying spondylosis. There is spondylosis/osteoarthritic changes several levels. No frank disc extrusion or stenosis. Slight calcification noted in the right carotid artery. Electronically Signed   By: Bretta Bang III M.D.   On: 01/23/2018 14:14   Ct Chest Wo Contrast  Result Date: 01/23/2018 CLINICAL DATA:  Possible rib fracture post trauma. EXAM: CT CHEST WITHOUT CONTRAST TECHNIQUE: Multidetector CT imaging of the chest was performed following the standard protocol without IV contrast. COMPARISON:  08/25/2007 and chest x-ray 09/05/2011 FINDINGS: Cardiovascular: Borderline cardiomegaly. Calcified plaque over the left anterior descending coronary artery. Mild calcified plaque over the thoracic aorta. Remaining vascular structures are within normal. Mediastinum/Nodes: Several shotty mediastinal lymph nodes are present there is no mediastinal or hilar adenopathy. Remaining mediastinal structures are within normal.  Lungs/Pleura: Lungs are well inflated with subtle posterior dependent atelectasis. No lobar consolidation or effusion. Airways are normal. Upper Abdomen: No acute findings. Mild calcified plaque over the abdominal aorta. Musculoskeletal: Old fifth posterolateral rib fracture. No acute rib fractures. Mild degenerate change of the spine. Compression fracture over the thoracolumbar junction unchanged. IMPRESSION: No acute rib fracture. No acute cardiopulmonary disease. Aortic Atherosclerosis (ICD10-I70.0). Minimal atherosclerotic coronary artery disease. Mild stable compression fracture over the thoracolumbar junction. Electronically Signed   By: Elberta Fortisaniel  Boyle M.D.   On: 01/23/2018 14:53   Ct Cervical Spine Wo Contrast  Result Date: 01/23/2018 CLINICAL DATA:  Altered level of consciousness with questionable fall EXAM: CT HEAD WITHOUT CONTRAST CT CERVICAL SPINE WITHOUT CONTRAST TECHNIQUE: Multidetector CT imaging of the head and cervical spine was performed following the standard protocol without intravenous contrast. Multiplanar CT image reconstructions of the cervical spine were also generated. COMPARISON:  Head CT December 23, 2016 FINDINGS: CT HEAD FINDINGS Brain: Relatively mild diffuse atrophy is stable. There is no intracranial mass, hemorrhage, extra-axial fluid collection, or midline shift. There is small vessel disease throughout the centra semiovale bilaterally as well as in the anterior and posterior limbs of each internal capsule. There is no new gray-white compartment lesion. No acute infarct is demonstrable on this study. Vascular: There is no appreciable hyperdense vessel. There is calcification in each carotid siphon. Skull: Bony calvarium appears intact. Sinuses/Orbits: There is mucosal thickening in multiple ethmoid air cells. Other visualized paranasal sinuses are clear. Orbits appear symmetric bilaterally except for previous cataract removal on the right. Other: Mastoid air cells are clear. CT  CERVICAL SPINE FINDINGS Alignment: There is 1 mm of anterolisthesis of C3 on C4. There is 1 mm of anterolisthesis of C4 on C5. There is 1 mm of retrolisthesis of C5 on C6. No other spondylolisthesis evident. Skull base and vertebrae: Skull base and craniocervical junction regions appear normal. There is pannus posterior to the odontoid, not causing appreciable impression on the craniocervical junction region. There is no appreciable fracture. There are no blastic or lytic bone lesions. Soft tissues and spinal canal: The prevertebral soft tissues and predental space regions are normal. There is no paraspinous lesion. No cord canal hematoma evident. Disc levels: There is severe disc space narrowing at C5-6 and C6-7. There is moderate disc space narrowing at C7-T1. There is slightly milder disc space narrowing at C4-5. There is multilevel facet osteoarthritic change. There is exit foraminal narrowing due to bony hypertrophy at C5-6 and C6-7 bilaterally. No frank disc extrusion or stenosis. Upper chest: Visualized upper lung zones are clear. Other: Mild calcification is noted in the right carotid artery. 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 No acute infarct. No mass or hemorrhage. There are foci of arterial vascular calcification. There is mucosal thickening in several ethmoid air cells. CT cervical spine: No evident fracture. Slight spondylolisthesis at several levels is due to underlying spondylosis. There is spondylosis/osteoarthritic changes several levels. No frank disc extrusion or stenosis. Slight calcification noted in the right carotid artery. Electronically Signed   By: Bretta BangWilliam  Woodruff III M.D.   On: 01/23/2018 14:14   Ct Abdomen Pelvis W Contrast  Result Date: 01/23/2018 CLINICAL DATA:  77 y/o  F; fall 5 days ago with back pain. EXAM: CT ABDOMEN AND PELVIS WITH CONTRAST TECHNIQUE: Multidetector CT imaging of the abdomen and pelvis was performed using the standard  protocol following bolus administration of intravenous contrast. CONTRAST:  100mL ISOVUE-300 IOPAMIDOL (ISOVUE-300) INJECTION 61% COMPARISON:  08/25/2007 CT abdomen and pelvis. FINDINGS: Lower chest: No acute abnormality. Hepatobiliary: No focal liver abnormality is seen. No gallstones or gallbladder wall thickening. No intrahepatic biliary ductal dilatation. The common bile duct measures up to 9 mm (series 3, image 28) and stable. Pancreas: Unremarkable. No pancreatic ductal dilatation or surrounding inflammatory changes. Spleen: Normal in size without focal abnormality. Adrenals/Urinary Tract: Adrenal glands are unremarkable. Kidneys are normal, without renal calculi, focal lesion, or hydronephrosis. Bladder is unremarkable. Stomach/Bowel: Stomach is within normal limits. Appendix appears normal. Duodenal diverticulum measuring 18 mm arising from the second second of the duodenum adjacent to the common bile duct. No evidence of bowel wall thickening, distention, or inflammatory changes. Vascular/Lymphatic: Aortic atherosclerosis. No enlarged abdominal or pelvic lymph nodes. Reproductive: Status post hysterectomy. No adnexal masses. Other: No abdominal wall hernia or abnormality. No abdominopelvic ascites. Musculoskeletal: T12 mild anterior compression deformity is stable. No acute fracture identified. IMPRESSION: 1. No acute process identified. 2. 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 3. Aortic atherosclerosis. 4. Stable mild T12 compression deformity. Electronically Signed   By: Mitzi Hansen M.D.   On: 01/23/2018 19:27   Dg Chest Port 1 View  Result Date: 01/25/2018 CLINICAL DATA:  Shortness of breath. EXAM: PORTABLE CHEST 1 VIEW COMPARISON:  Radiographs of September 05, 2011. FINDINGS: The heart size and mediastinal contours are within normal limits. Both lungs are clear. No pneumothorax or pleural effusion is noted. The visualized skeletal structures are unremarkable.  IMPRESSION: No acute cardiopulmonary abnormality seen. Electronically Signed   By: Lupita Raider, M.D.   On: 01/25/2018 09:33    Assessment/Plan:    #1 bradycardia with dizziness- this appears to have improved off the metoprolol- has participated well with therapy it appears but will need continued PT and OT as well as CNA support secondary to her fall risk.  2.  Hypertension- systolics recently appear to run more in the low 100s- she did have elevated readings with anxiety earlier in her stay- she will need continued monitoring- she does have primary care follow-up later this week agraffe she is on low-dose Norvasc 5 mg a day as well as Lasix 420mg  a day with a history of diastolic CHF.  3.  History of chronic diastolic CHF-with listed ejection fraction of 55%- continues on Lasix 20 mg a day--her weight and edema appears to be stable does not really complain of increased shortness ofbreath  #4-history of thoracic compression fractures at times she will have significant pain and Norco appears to help she appears to have done relatively well with her therapy but will need continued therapy and follow-up by primary care provider  #5- UTI at this point appears asymptomatic she has finished a course of Keflex.  6.  History of depression with anxiety she continues on Effexor.  7.  Apparent history of allergic rhinitis she continues on Allegra routinely.  8.  History of urinary frequency continues on Myrbetriq  #9 history of B-12 deficiency?  She is on supplementation will warrant follow-up by primary care provider.  I also note she is on magnesium which will warrant follow-up.  Will update a CBC with metabolic panel tomorrow to ensure stability  Again she will be going home with her spouse who is very supportive- she will need a rolling walker to assist with ambulation with fall risk in thoracic compression fracture history.  Also will need CNA support to help with activities of daily  living as well as continued PT and OT.  Have written for 20 tablets of Norco can have 1 or 2 tabs every 6 hours as needed for pain- she will have follow-up by primary care provider  [phone]- of note greater than 30 minutes spent on this discharge summary-greater than 50% of time spent coordinating a plan of care for numerous diagnoses

## 2018-02-03 ENCOUNTER — Encounter (HOSPITAL_COMMUNITY)
Admission: RE | Admit: 2018-02-03 | Discharge: 2018-02-03 | Disposition: A | Discharge status: Home / Self Care | Payer: Medicare HMO | Source: Skilled Nursing Facility | Attending: Internal Medicine | Admitting: Internal Medicine

## 2018-02-03 DIAGNOSIS — F329 Major depressive disorder, single episode, unspecified: Secondary | ICD-10-CM | POA: Insufficient documentation

## 2018-02-03 DIAGNOSIS — K5900 Constipation, unspecified: Secondary | ICD-10-CM | POA: Insufficient documentation

## 2018-02-03 DIAGNOSIS — I10 Essential (primary) hypertension: Secondary | ICD-10-CM | POA: Insufficient documentation

## 2018-02-03 DIAGNOSIS — M545 Low back pain: Secondary | ICD-10-CM | POA: Insufficient documentation

## 2018-02-03 DIAGNOSIS — M479 Spondylosis, unspecified: Secondary | ICD-10-CM | POA: Insufficient documentation

## 2018-02-03 DIAGNOSIS — I5032 Chronic diastolic (congestive) heart failure: Secondary | ICD-10-CM | POA: Insufficient documentation

## 2018-02-03 LAB — CBC WITH DIFFERENTIAL/PLATELET
BASOS ABS: 0 10*3/uL (ref 0.0–0.1)
BASOS PCT: 1 %
EOS ABS: 0.2 10*3/uL (ref 0.0–0.7)
Eosinophils Relative: 4 %
HEMATOCRIT: 40 % (ref 36.0–46.0)
HEMOGLOBIN: 12.7 g/dL (ref 12.0–15.0)
Lymphocytes Relative: 31 %
Lymphs Abs: 1.3 10*3/uL (ref 0.7–4.0)
MCH: 31.1 pg (ref 26.0–34.0)
MCHC: 31.8 g/dL (ref 30.0–36.0)
MCV: 98 fL (ref 78.0–100.0)
Monocytes Absolute: 0.3 10*3/uL (ref 0.1–1.0)
Monocytes Relative: 7 %
NEUTROS ABS: 2.5 10*3/uL (ref 1.7–7.7)
NEUTROS PCT: 57 %
Platelets: 135 10*3/uL — ABNORMAL LOW (ref 150–400)
RBC: 4.08 MIL/uL (ref 3.87–5.11)
RDW: 14.6 % (ref 11.5–15.5)
WBC: 4.3 10*3/uL (ref 4.0–10.5)

## 2018-02-03 LAB — BASIC METABOLIC PANEL
ANION GAP: 8 (ref 5–15)
BUN: 14 mg/dL (ref 8–23)
CHLORIDE: 104 mmol/L (ref 98–111)
CO2: 30 mmol/L (ref 22–32)
CREATININE: 0.63 mg/dL (ref 0.44–1.00)
Calcium: 9.1 mg/dL (ref 8.9–10.3)
GFR calc non Af Amer: >60 mL/min (ref >60)
Glucose, Bld: 122 mg/dL — ABNORMAL HIGH (ref 70–99)
Potassium: 4.3 mmol/L (ref 3.5–5.1)
SODIUM: 142 mmol/L (ref 135–145)

## 2018-02-03 LAB — MAGNESIUM: Magnesium: 2 mg/dL (ref 1.7–2.4)

## 2018-02-04 ENCOUNTER — Encounter (HOSPITAL_COMMUNITY)
Admission: RE | Admit: 2018-02-04 | Discharge: 2018-02-04 | Disposition: A | Discharge status: Home / Self Care | Payer: Medicare HMO | Source: Skilled Nursing Facility | Attending: Internal Medicine | Admitting: Internal Medicine

## 2018-02-04 DIAGNOSIS — F329 Major depressive disorder, single episode, unspecified: Secondary | ICD-10-CM | POA: Insufficient documentation

## 2018-02-04 DIAGNOSIS — K5900 Constipation, unspecified: Secondary | ICD-10-CM | POA: Insufficient documentation

## 2018-02-04 DIAGNOSIS — I10 Essential (primary) hypertension: Secondary | ICD-10-CM | POA: Insufficient documentation

## 2018-02-04 DIAGNOSIS — I5032 Chronic diastolic (congestive) heart failure: Secondary | ICD-10-CM | POA: Insufficient documentation

## 2018-02-04 DIAGNOSIS — M545 Low back pain: Secondary | ICD-10-CM | POA: Insufficient documentation

## 2018-02-04 DIAGNOSIS — M479 Spondylosis, unspecified: Secondary | ICD-10-CM | POA: Insufficient documentation

## 2018-02-13 NOTE — Progress Notes (Deleted)
Cardiology Office Note   Date:  02/13/2018   ID:  Kari Young, DOB 1940-09-09, MRN [id number]  PCP:  Dorisann Frames, MD  Cardiologist: Dr. Purvis Sheffield  No chief complaint on file.    History of Present Illness: KYLANI Young is a 77 y.o. female who presents for post hospital follow up. She was seen on consultation for chest pain on 01/25/2018.  She had a history of generalized weakness, fatigue, and lightheadedness with near syncope. Chest tightness felt like her bra was too tight. This was relieved with po pain medication.   Other history includes hypertension, chronic diastolic CHF, and depression. She was ruled out for ACS. She was found to be bradycardic and metoprolol was discontinued. She was also found to have UTI and treated with antibiotics.   She was discharged to SNF.     Past Medical History:  Diagnosis Date  . Hypertension   . MRSA infection     Past Surgical History:  Procedure Laterality Date  . ABDOMINAL HYSTERECTOMY    . APPENDECTOMY    . NASAL SINUS SURGERY       Current Outpatient Medications  Medication Sig Dispense Refill  . amLODipine (NORVASC) 5 MG tablet Take 5 mg by mouth daily.    . Ascorbic Acid (VITAMIN C PO) Take 1 tablet by mouth daily.    . Cholecalciferol 1000 UNITS tablet Take 1,000 Units by mouth daily.    Marland Kitchen conjugated estrogens (PREMARIN) vaginal cream Place 1 Applicatorful vaginally every Monday, Wednesday, and Friday.    . Cyanocobalamin (VITAMIN B-12 PO) Take 1 tablet by mouth daily.    Marland Kitchen esomeprazole (NEXIUM) 40 MG capsule Take 40 mg by mouth daily. Reported on 10/08/2015    . fexofenadine (ALLEGRA) 180 MG tablet Take 180 mg by mouth daily.    . furosemide (LASIX) 20 MG tablet Take 20 mg by mouth.    Marland Kitchen HYDROcodone-acetaminophen (NORCO/VICODIN) 5-325 MG tablet Take 1-2 tablets by mouth every 6 (six) hours as needed for moderate pain. 30 tablet 0  . hydrocortisone (ANUSOL-HC) 2.5 % rectal cream Place 1 application rectally every 6  (six) hours as needed for hemorrhoids or anal itching.    . magnesium oxide (MAG-OX) 400 MG tablet Take 400 mg by mouth daily.     . mirabegron ER (MYRBETRIQ) 50 MG TB24 tablet Take 50 mg by mouth daily.    . polyethylene glycol (MIRALAX / GLYCOLAX) packet Take 17 g by mouth 2 (two) times daily. 14 each 0  . Probiotic Product (RISA-BID PROBIOTIC) TABS Take 1 tablet by mouth twice a day.    . senna-docusate (SENOKOT-S) 8.6-50 MG tablet Take 1 tablet by mouth daily. 30 tablet 0  . venlafaxine (EFFEXOR-XR) 150 MG 24 hr capsule Take 150 mg by mouth daily.     No current facility-administered medications for this visit.     Allergies:   Percocet [oxycodone-acetaminophen] and Sulfamethoxazole    Social History:  The patient  reports that she has never smoked. She has never used smokeless tobacco. She reports that she does not drink alcohol or use drugs.   Family History:  The patient's family history is not on file.    ROS: All other systems are reviewed and negative. Unless otherwise mentioned in H&P    PHYSICAL EXAM: VS:  There were no vitals taken for this visit. , BMI There is no height or weight on file to calculate BMI. GEN: Well nourished, well developed, in no acute distress  HEENT: normal  Neck: no JVD, carotid bruits, or masses Cardiac: ***RRR; no murmurs, rubs, or gallops,no edema  Respiratory:  clear to auscultation bilaterally, normal work of breathing GI: soft, nontender, nondistended, + BS MS: no deformity or atrophy  Skin: warm and dry, no rash Neuro:  Strength and sensation are intact Psych: euthymic mood, full affect   EKG:  EKG {ACTION; IS/IS [id number]} ordered today. The ekg ordered today demonstrates ***   Recent Labs: 01/23/2018: ALT 19; TSH 1.375 02/03/2018: BUN 14; Creatinine, Ser 0.63; Hemoglobin 12.7; Magnesium 2.0; Platelets 135; Potassium 4.3; Sodium 142    Lipid Panel No results found for: CHOL, TRIG, HDL, CHOLHDL, VLDL, LDLCALC, LDLDIRECT     Wt Readings from Last 3 Encounters:  01/28/18 199 lb 9.6 oz (90.5 kg)  01/26/18 197 lb 14.4 oz (89.8 kg)  05/31/15 160 lb (72.6 kg)      Other studies Reviewed: Additional studies/ records that were reviewed today include: ***. Review of the above records demonstrates: ***   ASSESSMENT AND PLAN:  1.  ***   Current medicines are reviewed at length with the patient today.    Labs/ tests ordered today include: *** Bettey Mare. Liborio Nixon, ANP, AACC   02/13/2018 11:55 AM    [facility] [address] [place], [place] [address] Phone: [phone]; Fax: [phone]

## 2018-02-15 ENCOUNTER — Ambulatory Visit: Payer: Medicare HMO | Admitting: Cardiovascular Disease

## 2018-02-15 ENCOUNTER — Ambulatory Visit: Payer: Medicare HMO | Admitting: Adult Health

## 2018-03-01 NOTE — Progress Notes (Deleted)
Cardiology Office Note:    Date:  03/01/2018   ID:  Kari Young, DOB 08-25-1940, MRN [id number]  PCP:  Dorisann Frames, MD  Cardiologist:  Prentice Docker, MD   Referring MD: Samuel Jester, DO   No chief complaint on file. ***  History of Present Illness:    Kari Young is a 77 y.o. female with a hx of chronic diastolic heart failure, hypertension, and depression.  She presented to [facility] 01/23/2018 for generalized weakness and fatigue.  She has had recurrent near syncopal episodes for several days prior.  EKG with PVCs and LVH.  Cardiology was consulted for chest pain.  Beta-blocker was discontinued for near syncope and bradycardia.  There was some indication that she was taking high dose of Lopressor.  She returns today for clinic follow-up.    Past Medical History:  Diagnosis Date  . Hypertension   . MRSA infection     Past Surgical History:  Procedure Laterality Date  . ABDOMINAL HYSTERECTOMY    . APPENDECTOMY    . NASAL SINUS SURGERY      Current Medications: No outpatient medications have been marked as taking for the 03/02/18 encounter (Appointment) with Marcelino Duster, PA.     Allergies:   Percocet [oxycodone-acetaminophen] and Sulfamethoxazole   Social History   Socioeconomic History  . Marital status: Married    Spouse name: Not on file  . Number of children: Not on file  . Years of education: Not on file  . Highest education level: Not on file  Occupational History  . Not on file  Social Needs  . Financial resource strain: Not on file  . Food insecurity:    Worry: Not on file    Inability: Not on file  . Transportation needs:    Medical: Not on file    Non-medical: Not on file  Tobacco Use  . Smoking status: Never Smoker  . Smokeless tobacco: Never Used  Substance and Sexual Activity  . Alcohol use: No  . Drug use: Never  . Sexual activity: Not on file  Lifestyle  . Physical activity:    Days per week:  Not on file    Minutes per session: Not on file  . Stress: Not on file  Relationships  . Social connections:    Talks on phone: Not on file    Gets together: Not on file    Attends religious service: Not on file    Active member of club or organization: Not on file    Attends meetings of clubs or organizations: Not on file    Relationship status: Not on file  Other Topics Concern  . Not on file  Social History Narrative  . Not on file     Family History: The patient's ***family history is negative for Sudden Cardiac Death.  ROS:   Please see the history of present illness.    *** All other systems reviewed and are negative.  EKGs/Labs/Other Studies Reviewed:    The following studies were reviewed today: ***  EKG:  EKG is *** ordered today.  The ekg ordered today demonstrates ***  Recent Labs: 01/23/2018: ALT 19; TSH 1.375 02/03/2018: BUN 14; Creatinine, Ser 0.63; Hemoglobin 12.7; Magnesium 2.0; Platelets 135; Potassium 4.3; Sodium 142  Recent Lipid Panel No results found for: CHOL, TRIG, HDL, CHOLHDL, VLDL, LDLCALC, LDLDIRECT  Physical Exam:    VS:  There were no vitals taken for this visit.    Wt Readings from  Last 3 Encounters:  01/28/18 199 lb 9.6 oz (90.5 kg)  01/26/18 197 lb 14.4 oz (89.8 kg)  05/31/15 160 lb (72.6 kg)     GEN: *** Well nourished, well developed in no acute distress HEENT: Normal NECK: No JVD; No carotid bruits LYMPHATICS: No lymphadenopathy CARDIAC: ***RRR, no murmurs, rubs, gallops RESPIRATORY:  Clear to auscultation without rales, wheezing or rhonchi  ABDOMEN: Soft, non-tender, non-distended MUSCULOSKELETAL:  No edema; No deformity  SKIN: Warm and dry NEUROLOGIC:  Alert and oriented x 3 PSYCHIATRIC:  Normal affect   ASSESSMENT:    No diagnosis found. PLAN:    In order of problems listed above:  No diagnosis found.   Medication Adjustments/Labs and Tests Ordered: Current medicines are reviewed at length with the patient  today.  Concerns regarding medicines are outlined above.  No orders of the defined types were placed in this encounter.  No orders of the defined types were placed in this encounter.   Signed, Marcelino Dusterngela Nicole Duke, [place]  03/01/2018 5:17 PM     [facility]

## 2018-03-02 ENCOUNTER — Ambulatory Visit: Payer: Medicare HMO | Admitting: Physician Assistant

## 2018-03-03 ENCOUNTER — Encounter: Payer: Self-pay | Admitting: *Deleted

## 2018-04-07 ENCOUNTER — Ambulatory Visit: Payer: Medicare HMO | Admitting: Urology

## 2018-05-14 ENCOUNTER — Emergency Department (HOSPITAL_COMMUNITY): Payer: Medicare HMO

## 2018-05-14 ENCOUNTER — Other Ambulatory Visit: Payer: Self-pay

## 2018-05-14 ENCOUNTER — Emergency Department (HOSPITAL_COMMUNITY)
Admission: EM | Admit: 2018-05-14 | Discharge: 2018-05-15 | Disposition: A | Discharge status: Home / Self Care | Payer: Medicare HMO | Attending: Emergency Medicine | Admitting: Emergency Medicine

## 2018-05-14 ENCOUNTER — Encounter (HOSPITAL_COMMUNITY): Payer: Self-pay | Admitting: Emergency Medicine

## 2018-05-14 DIAGNOSIS — Z79899 Other long term (current) drug therapy: Secondary | ICD-10-CM | POA: Diagnosis not present

## 2018-05-14 DIAGNOSIS — I110 Hypertensive heart disease with heart failure: Secondary | ICD-10-CM | POA: Insufficient documentation

## 2018-05-14 DIAGNOSIS — N390 Urinary tract infection, site not specified: Secondary | ICD-10-CM | POA: Diagnosis not present

## 2018-05-14 DIAGNOSIS — I5032 Chronic diastolic (congestive) heart failure: Secondary | ICD-10-CM | POA: Insufficient documentation

## 2018-05-14 DIAGNOSIS — R1010 Upper abdominal pain, unspecified: Secondary | ICD-10-CM | POA: Insufficient documentation

## 2018-05-14 DIAGNOSIS — R339 Retention of urine, unspecified: Secondary | ICD-10-CM | POA: Diagnosis present

## 2018-05-14 LAB — CBC WITH DIFFERENTIAL/PLATELET
Abs Immature Granulocytes: 0.02 10*3/uL (ref 0.00–0.07)
Basophils Absolute: 0.1 10*3/uL (ref 0.0–0.1)
Basophils Relative: 1 %
EOS PCT: 5 %
Eosinophils Absolute: 0.3 10*3/uL (ref 0.0–0.5)
HEMATOCRIT: 45 % (ref 36.0–46.0)
Hemoglobin: 14.4 g/dL (ref 12.0–15.0)
Immature Granulocytes: 0 %
Lymphocytes Relative: 32 %
Lymphs Abs: 2.1 10*3/uL (ref 0.7–4.0)
MCH: 30.7 pg (ref 26.0–34.0)
MCHC: 32 g/dL (ref 30.0–36.0)
MCV: 95.9 fL (ref 80.0–100.0)
Monocytes Absolute: 0.5 10*3/uL (ref 0.1–1.0)
Monocytes Relative: 7 %
Neutro Abs: 3.6 10*3/uL (ref 1.7–7.7)
Neutrophils Relative %: 55 %
Platelets: 144 10*3/uL — ABNORMAL LOW (ref 150–400)
RBC: 4.69 MIL/uL (ref 3.87–5.11)
RDW: 13.6 % (ref 11.5–15.5)
WBC: 6.6 10*3/uL (ref 4.0–10.5)
nRBC: 0 % (ref 0.0–0.2)

## 2018-05-14 LAB — COMPREHENSIVE METABOLIC PANEL
ALT: 22 U/L (ref 0–44)
AST: 25 U/L (ref 15–41)
Albumin: 3.9 g/dL (ref 3.5–5.0)
Alkaline Phosphatase: 67 U/L (ref 38–126)
Anion gap: 13 (ref 5–15)
BILIRUBIN TOTAL: 0.4 mg/dL (ref 0.3–1.2)
BUN: 15 mg/dL (ref 8–23)
CO2: 22 mmol/L (ref 22–32)
Calcium: 9.4 mg/dL (ref 8.9–10.3)
Chloride: 105 mmol/L (ref 98–111)
Creatinine, Ser: 0.85 mg/dL (ref 0.44–1.00)
GFR calc Af Amer: >60 mL/min (ref >60)
GFR calc non Af Amer: >60 mL/min (ref >60)
GLUCOSE: 97 mg/dL (ref 70–99)
Potassium: 4.4 mmol/L (ref 3.5–5.1)
Sodium: 140 mmol/L (ref 135–145)
TOTAL PROTEIN: 6.9 g/dL (ref 6.5–8.1)

## 2018-05-14 LAB — URINALYSIS, ROUTINE W REFLEX MICROSCOPIC
Glucose, UA: NEGATIVE mg/dL
Ketones, ur: 5 mg/dL — AB
Nitrite: POSITIVE — AB
Protein, ur: NEGATIVE mg/dL
Specific Gravity, Urine: 1.026 (ref 1.005–1.030)
WBC, UA: >50 WBC/hpf — ABNORMAL HIGH (ref 0–5)
pH: 5 (ref 5.0–8.0)

## 2018-05-14 LAB — POC OCCULT BLOOD, ED: Fecal Occult Bld: NEGATIVE

## 2018-05-14 LAB — TROPONIN I: Troponin I: <0.03 ng/mL (ref <0.03)

## 2018-05-14 MED ORDER — CEPHALEXIN 500 MG PO CAPS: 500.0000 mg | ORAL_CAPSULE | Freq: Two times a day (BID) | ORAL | 0 refills | Status: AC

## 2018-05-14 MED ORDER — IOHEXOL 300 MG/ML  SOLN
100.0000 mL | Freq: Once | INTRAMUSCULAR | Status: AC | PRN
  Administered 2018-05-14: 100 mL via INTRAVENOUS

## 2018-05-14 MED ORDER — ONDANSETRON 4 MG PO TBDP: 4.0000 mg | ORAL_TABLET | Freq: Three times a day (TID) | ORAL | 0 refills | Status: DC | PRN

## 2018-05-14 MED ORDER — SODIUM CHLORIDE 0.9 % IV SOLN
1.0000 g | Freq: Once | INTRAVENOUS | Status: AC
  Administered 2018-05-14: 1 g via INTRAVENOUS
  Filled 2018-05-14: qty 10

## 2018-05-14 MED ORDER — SODIUM CHLORIDE 0.9 % IV BOLUS
1000.0000 mL | Freq: Once | INTRAVENOUS | Status: AC
  Administered 2018-05-14: 1000 mL via INTRAVENOUS

## 2018-05-14 NOTE — ED Notes (Signed)
Patient transported to CT 

## 2018-05-14 NOTE — Discharge Instructions (Addendum)
Your testing this evening showed that you had an abnormal urinalysis.  It was consistent with a urinary tract infection and given your symptoms over the last 2 weeks it is probably gotten into your kidneys.  We have treated you with a very strong antibiotic called Rocephin and I want you to continue with the following medication  Cephalexin, 500 mg twice daily for the next 7 days.  We did perform a urinary culture.  This means that we will try to grow the infection, isolate and identify which bacteria it is and find out which antibiotic works the best.  If you need a different antibiotic we will call went into the pharmacy and we will let you know.  In the meantime if you should develop severe or worsening symptoms including high fevers vomiting or persistent or worsening pain please return to the emergency department immediately.  You also have been struggling with constipation.  Because of this she will need to be on some medications to help clean out your bowel as well as keep you regular.  Because constipation has been a chronic problem for you you will likely find better results by taking a daily stool softener or laxative once you have become regular.  I would like you to start with the following medications  Magnesium citrate, drink 1 whole bottle followed by 2 doses of MiraLAX tomorrow.  I would then like you to take 2 doses of MiraLAX daily until you are having regular soft stools or even diarrhea.  At that point I would like you to back off and take only Colace or Dulcolax once a day to maintain a soft stool.  I would also like you to follow-up with a gastroenterologist if this continues to be a problem.  Your family doctor can make this referral.

## 2018-05-14 NOTE — ED Provider Notes (Signed)
[facility] EMERGENCY DEPARTMENT Provider Note   CSN: [id number] Arrival date & time: 05/14/18  1909     History   Chief Complaint Chief Complaint  Patient presents with  . Urinary Retention    HPI Kari Young is a 77 y.o. female.  HPI  Patient is a 77 year old female, she has multiple medical problems including a history of chronic diastolic congestive heart failure, frequent falls, hypertension, she does not have a history of diabetes, she denies prior ischemic cardiac disease.  She presents today with a couple of complaints but primarily states that for the last 2-1/2 weeks she has been nauseated having subjective fevers and chills and diaphoresis.  States that she has absolutely no pain except for when she has a bowel movement and states that she feels severely constipated and is having very small amounts of brown stool covered in blood.  She is tried to urinate today but is finding that she is having severe dysuria and small amounts of urine.,  Unable to pass normal amounts of urine.  Having some flank pain with it as well.  The patient denies chest pain coughing shortness of breath.  She has been seen by her family doctor just before this happened and got the flu shot and states all of this came on after that.  Past Medical History:  Diagnosis Date  . Hypertension   . MRSA infection     Patient Active Problem List   Diagnosis Date Noted  . Near syncope 01/25/2018  . Bradycardia   . Chest tightness   . Hypertension 01/23/2018  . Sinus bradycardia by electrocardiogram 01/23/2018  . Postural dizziness with near syncope 01/23/2018  . Frequent falls 01/23/2018  . Thoracic compression fracture, closed, initial encounter (HCC) 01/23/2018  . Chronic diastolic CHF (congestive heart failure) (HCC) 01/23/2018  . COLLES' FRACTURE, LEFT 11/12/2009    Past Surgical History:  Procedure Laterality Date  . ABDOMINAL HYSTERECTOMY    . APPENDECTOMY    .  NASAL SINUS SURGERY       OB History   None      Home Medications    Prior to Admission medications   Medication Sig Start Date End Date Taking? Authorizing Provider  amLODipine (NORVASC) 5 MG tablet Take 5 mg by mouth daily.    [provider]  Ascorbic Acid (VITAMIN C PO) Take 1 tablet by mouth daily.    [provider]  cephALEXin (KEFLEX) 500 MG capsule Take 1 capsule (500 mg total) by mouth 2 (two) times daily for 7 days. 05/14/18 05/21/18  Eber Hong, MD  Cholecalciferol 1000 UNITS tablet Take 1,000 Units by mouth daily.    [provider]  conjugated estrogens (PREMARIN) vaginal cream Place 1 Applicatorful vaginally every Monday, Wednesday, and Friday.    [provider]  Cyanocobalamin (VITAMIN B-12 PO) Take 1 tablet by mouth daily.    [provider]  esomeprazole (NEXIUM) 40 MG capsule Take 40 mg by mouth daily. Reported on 10/08/2015    [provider]  fexofenadine (ALLEGRA) 180 MG tablet Take 180 mg by mouth daily.    [provider]  furosemide (LASIX) 20 MG tablet Take 20 mg by mouth.    [provider]  HYDROcodone-acetaminophen (NORCO/VICODIN) 5-325 MG tablet Take 1-2 tablets by mouth every 6 (six) hours as needed for moderate pain. 01/26/18   Regalado, [name], MD  hydrocortisone (ANUSOL-HC) 2.5 % rectal cream Place 1 application rectally every 6 (six) hours as  needed for hemorrhoids or anal itching.    [provider]  magnesium oxide (MAG-OX) 400 MG tablet Take 400 mg by mouth daily.     [provider]  mirabegron ER (MYRBETRIQ) 50 MG TB24 tablet Take 50 mg by mouth daily.    [provider]  ondansetron (ZOFRAN ODT) 4 MG disintegrating tablet Take 1 tablet (4 mg total) by mouth every 8 (eight) hours as needed for nausea. 05/14/18   Eber Hong, MD  polyethylene glycol [facility] Ethelene Hal) packet Take 17 g by mouth 2 (two) times daily. 01/26/18   Regalado, [name], MD    Probiotic Product (RISA-BID PROBIOTIC) TABS Take 1 tablet by mouth twice a day.    [provider]  senna-docusate (SENOKOT-S) 8.6-50 MG tablet Take 1 tablet by mouth daily. 01/27/18   Regalado, [name], MD  venlafaxine (EFFEXOR-XR) 150 MG 24 hr capsule Take 150 mg by mouth daily.    [provider]    Family History Family History  Problem Relation Age of Onset  . Sudden Cardiac Death Neg Hx     Social History Social History   Tobacco Use  . Smoking status: Never Smoker  . Smokeless tobacco: Never Used  Substance Use Topics  . Alcohol use: No  . Drug use: Never     Allergies   Percocet [oxycodone-acetaminophen] and Sulfamethoxazole   Review of Systems Review of Systems  All other systems reviewed and are negative.     Physical Exam Updated Vital Signs BP 116/71   Pulse 81   Temp 98.6 F (37 C) (Oral)   Resp 16   SpO2 100%   Physical Exam  Constitutional: She appears well-developed and well-nourished. No distress.  HENT:  Head: Normocephalic and atraumatic.  Mouth/Throat: Oropharynx is clear and moist. No oropharyngeal exudate.  Eyes: Pupils are equal, round, and reactive to light. Conjunctivae and EOM are normal. Right eye exhibits no discharge. Left eye exhibits no discharge. No scleral icterus.  Neck: Normal range of motion. Neck supple. No JVD present. No thyromegaly present.  Cardiovascular: Normal rate, regular rhythm, normal heart sounds and intact distal pulses. Exam reveals no gallop and no friction rub.  No murmur heard. Pulmonary/Chest: Effort normal and breath sounds normal. No respiratory distress. She has no wheezes. She has no rales.  Abdominal: Soft. Bowel sounds are normal. She exhibits no distension and no mass. There is no tenderness.  Musculoskeletal: Normal range of motion. She exhibits no edema or tenderness.  Lymphadenopathy:    She has no cervical adenopathy.  Neurological: She is alert. Coordination normal.  Skin:  Skin is warm and dry. No rash noted. No erythema.  Psychiatric: She has a normal mood and affect. Her behavior is normal.  Nursing note and vitals reviewed.   ED Treatments / Results  Labs (all labs ordered are listed, but only abnormal results are displayed) Labs Reviewed  CBC WITH DIFFERENTIAL/PLATELET - Abnormal; Notable for the following components:      Result Value   Platelets 144 (*)    All other components within normal limits  URINALYSIS, ROUTINE W REFLEX MICROSCOPIC - Abnormal; Notable for the following components:   Color, Urine AMBER (*)    APPearance HAZY (*)    Hgb urine dipstick SMALL (*)    Bilirubin Urine SMALL (*)    Ketones, ur 5 (*)    Nitrite POSITIVE (*)    Leukocytes, UA LARGE (*)    WBC, UA >50 (*)    Bacteria,  UA FEW (*)    Non Squamous Epithelial 0-5 (*)    All other components within normal limits  URINE CULTURE  COMPREHENSIVE METABOLIC PANEL  TROPONIN I  POC OCCULT BLOOD, ED    EKG EKG Interpretation  Date/Time:  Friday May 14 2018 22:28:30 EST Ventricular Rate:  79 PR Interval:    QRS Duration: 107 QT Interval:  410 QTC Calculation: 470 R Axis:   16 Text Interpretation:  Sinus rhythm Low voltage, precordial leads Baseline wander in lead(s) V1 V6 since last tracing no significant change Confirmed by Eber Hong ([id number]) on 05/14/2018 10:33:40 PM   Radiology Ct Abdomen Pelvis W Contrast  Result Date: 05/15/2018 CLINICAL DATA:  Upper abdominal pain with fatigue. Nausea and weakness for 3 weeks EXAM: CT ABDOMEN AND PELVIS WITH CONTRAST TECHNIQUE: Multidetector CT imaging of the abdomen and pelvis was performed using the standard protocol following bolus administration of intravenous contrast. CONTRAST:  OMNIPAQUE IOHEXOL 300 MG/ML  SOLN COMPARISON:  01/23/2018 FINDINGS: Lower chest: Lung bases are clear. Hepatobiliary: No focal hepatic lesion. No biliary duct dilatation. Gallbladder is normal. Common bile duct is normal. Pancreas:  Pancreas is normal. No ductal dilatation. No pancreatic inflammation. Spleen: Normal spleen Adrenals/urinary tract: Adrenal glands and kidneys are normal. The ureters and bladder normal. Stomach/Bowel: Stomach, small-bowel cecum normal. Appendix not identified. Colon rectosigmoid colon normal. Vascular/Lymphatic: Abdominal aorta is normal caliber. No periportal or retroperitoneal adenopathy. No pelvic adenopathy. Reproductive: Post hysterectomy. Other: No free fluid. Musculoskeletal: No aggressive osseous lesion. IMPRESSION: No acute abdominal or pelvic findings. Electronically Signed   By: Genevive Bi M.D.   On: 05/15/2018 00:02    Procedures Procedures (including critical care time)  Procedure Note:  Anoscopy  Risks benefits alternatives of the procedure given to the patient Verbal Consent obtained Patient placed in the lateral decubitus position, chaperone present Anoscopy performed Findings : Normal-appearing perianal area, no redness tenderness induration or swelling in this area.  She has light brown-tan stool in the rectal vault without blood or hemorrhoids.  Hemoccult negative on stool sample. Patient tolerated procedure without any complaints   Medications Ordered in ED Medications  sodium chloride 0.9 % bolus 1,000 mL (1,000 mLs Intravenous New Bag/Given 05/14/18 2310)  cefTRIAXone (ROCEPHIN) 1 g in sodium chloride 0.9 % 100 mL IVPB (1 g Intravenous New Bag/Given 05/14/18 2330)  iohexol (OMNIPAQUE) 300 MG/ML solution 100 mL (100 mLs Intravenous Contrast Given 05/14/18 2321)     Initial Impression / Assessment and Plan / ED Course  I have reviewed the triage vital signs and the nursing notes.  Pertinent labs & imaging results that were available during my care of the patient were reviewed by me and considered in my medical decision making (see chart for details).    The patient's abdominal exam is benign, her vital signs are reassuring without fever tachycardia or fever.  Her  blood work shows no leukocytosis and metabolic panel shows no renal dysfunction or electrolyte abnormalities.  Her urinalysis does show some ketonuria and is nitrite positive, leukocyte positive with large leukocytes and greater than 50 white blood cells seen.  She has few bacteria but in the presence of her significant dysuria and small-volume urination this evening it is very suggestive of a urinary tract infection including possible pyelonephritis given her protracted course and some flank type pain.  She is well-appearing without vomiting.  She will be given IV fluids and Rocephin.  Urine culture was added and a CT scan will be performed to make  sure there is no signs of renal abscess or obstruction.  The patient is agreeable to the plan and very well-appearing, I suspect she will be discharged barring any significant findings on CT scan.  Thankfully the CT scan is negative, the patient is very stable appearing and ready for discharge after antibiotics.   Final Clinical Impressions(s) / ED Diagnoses   Final diagnoses:  Lower urinary tract infectious disease    ED Discharge Orders         Ordered    cephALEXin (KEFLEX) 500 MG capsule  2 times daily     05/14/18 2319    ondansetron (ZOFRAN ODT) 4 MG disintegrating tablet  Every 8 hours PRN     05/14/18 2319           Eber HongMiller, Sarann Tregre, MD 05/15/18 0007

## 2018-05-14 NOTE — ED Notes (Signed)
Bladder scanner showed bladder volume of 33mL

## 2018-05-14 NOTE — ED Triage Notes (Signed)
Pt reports feeling sick for 3 weeks, pt endorses nausea, chills, and fatigue. Pt denies vomiting. Pt reports today she hasn't been able to urinate, pt reports only a few drops will come out.Pt reports she developed back pain. Pt reports she had a flu shot 3 weeks ago.

## 2018-05-15 NOTE — ED Notes (Signed)
Discharge instructions reviewed with patient. All questions answered. Patient wheeled to vehicle with belongings  

## 2018-05-17 LAB — URINE CULTURE: Culture: >=100000 — AB

## 2018-05-18 ENCOUNTER — Telehealth: Payer: Self-pay | Admitting: Emergency Medicine

## 2018-05-18 NOTE — Telephone Encounter (Signed)
Post ED Visit - Positive Culture Follow-up  Culture report reviewed by antimicrobial stewardship pharmacist:  []  Enzo BiNathan Batchelder, Pharm.D. []  Celedonio MiyamotoJeremy Frens, Pharm.D., BCPS AQ-ID []  Garvin FilaMike Maccia, Pharm.D., BCPS []  Georgina PillionElizabeth Martin, [address]Pharm.D., BCPS []  New StantonMinh Pham, [place]arm.D., BCPS, AAHIVP []  Estella HuskMichelle Turner, Pharm.D., BCPS, AAHIVP [x]  Lysle Pearlachel Rumbarger, PharmD, BCPS []  Phillips Climeshuy Dang, PharmD, BCPS []  Agapito GamesAlison Masters, PharmD, BCPS []  Verlan FriendsErin Deja, PharmD  Positive urine culture Treated with cephalexin, organism sensitive to the same and no further patient follow-up is required at this time.  Berle MullMiller, Buren Havey 05/18/2018, 10:32 AM

## 2019-02-02 ENCOUNTER — Ambulatory Visit: Payer: Medicare HMO | Admitting: Urology

## 2019-06-05 ENCOUNTER — Encounter (HOSPITAL_COMMUNITY): Payer: Self-pay | Admitting: Emergency Medicine

## 2019-06-05 ENCOUNTER — Emergency Department (HOSPITAL_COMMUNITY): Payer: Medicare HMO

## 2019-06-05 ENCOUNTER — Other Ambulatory Visit: Payer: Self-pay

## 2019-06-05 ENCOUNTER — Emergency Department (HOSPITAL_COMMUNITY)
Admission: EM | Admit: 2019-06-05 | Discharge: 2019-06-05 | Disposition: A | Discharge status: Home / Self Care | Payer: Medicare HMO | Attending: Emergency Medicine | Admitting: Emergency Medicine

## 2019-06-05 DIAGNOSIS — W010XXA Fall on same level from slipping, tripping and stumbling without subsequent striking against object, initial encounter: Secondary | ICD-10-CM | POA: Diagnosis not present

## 2019-06-05 DIAGNOSIS — S82841A Displaced bimalleolar fracture of right lower leg, initial encounter for closed fracture: Secondary | ICD-10-CM | POA: Diagnosis not present

## 2019-06-05 DIAGNOSIS — Y9389 Activity, other specified: Secondary | ICD-10-CM | POA: Diagnosis not present

## 2019-06-05 DIAGNOSIS — Y929 Unspecified place or not applicable: Secondary | ICD-10-CM | POA: Diagnosis not present

## 2019-06-05 DIAGNOSIS — Z79899 Other long term (current) drug therapy: Secondary | ICD-10-CM | POA: Insufficient documentation

## 2019-06-05 DIAGNOSIS — Y999 Unspecified external cause status: Secondary | ICD-10-CM | POA: Insufficient documentation

## 2019-06-05 DIAGNOSIS — I110 Hypertensive heart disease with heart failure: Secondary | ICD-10-CM | POA: Diagnosis not present

## 2019-06-05 DIAGNOSIS — S8991XA Unspecified injury of right lower leg, initial encounter: Secondary | ICD-10-CM | POA: Diagnosis present

## 2019-06-05 DIAGNOSIS — I5032 Chronic diastolic (congestive) heart failure: Secondary | ICD-10-CM | POA: Diagnosis not present

## 2019-06-05 DIAGNOSIS — S82892A Other fracture of left lower leg, initial encounter for closed fracture: Secondary | ICD-10-CM

## 2019-06-05 MED ORDER — MORPHINE SULFATE (PF) 4 MG/ML IV SOLN
4.0000 mg | Freq: Once | INTRAVENOUS | Status: AC
  Administered 2019-06-05: 4 mg via INTRAVENOUS
  Filled 2019-06-05: qty 1

## 2019-06-05 MED ORDER — FENTANYL CITRATE (PF) 100 MCG/2ML IJ SOLN
INTRAMUSCULAR | Status: AC | PRN
  Administered 2019-06-05: 50 ug via INTRAVENOUS

## 2019-06-05 MED ORDER — FENTANYL CITRATE (PF) 100 MCG/2ML IJ SOLN
50.0000 ug | Freq: Once | INTRAMUSCULAR | Status: AC
  Administered 2019-06-05: 50 ug via INTRAVENOUS
  Filled 2019-06-05: qty 2

## 2019-06-05 MED ORDER — MIDAZOLAM HCL 2 MG/2ML IJ SOLN
INTRAMUSCULAR | Status: AC | PRN
  Administered 2019-06-05: 2 mg via INTRAVENOUS

## 2019-06-05 MED ORDER — PROPOFOL 10 MG/ML IV BOLUS
0.5000 mg/kg | Freq: Once | INTRAVENOUS | Status: DC
  Filled 2019-06-05: qty 20

## 2019-06-05 MED ORDER — FENTANYL CITRATE (PF) 100 MCG/2ML IJ SOLN
50.0000 ug | Freq: Once | INTRAMUSCULAR | Status: AC
  Administered 2019-06-05: 08:00:00 50 ug via INTRAVENOUS
  Filled 2019-06-05: qty 2

## 2019-06-05 MED ORDER — PROPOFOL 10 MG/ML IV BOLUS
INTRAVENOUS | Status: AC | PRN
  Administered 2019-06-05: 20 mg via INTRAVENOUS

## 2019-06-05 MED ORDER — ONDANSETRON HCL 4 MG/2ML IJ SOLN
4.0000 mg | Freq: Once | INTRAMUSCULAR | Status: AC
  Administered 2019-06-05: 07:00:00 4 mg via INTRAVENOUS
  Filled 2019-06-05: qty 2

## 2019-06-05 MED ORDER — ONDANSETRON 4 MG PO TBDP: 4.0000 mg | ORAL_TABLET | Freq: Three times a day (TID) | ORAL | 0 refills | Status: DC | PRN

## 2019-06-05 MED ORDER — MIDAZOLAM HCL 2 MG/2ML IJ SOLN
2.0000 mg | Freq: Once | INTRAMUSCULAR | Status: AC
  Administered 2019-06-05: 2 mg via INTRAVENOUS
  Filled 2019-06-05: qty 2

## 2019-06-05 MED ORDER — HYDROCODONE-ACETAMINOPHEN 5-325 MG PO TABS: 1.0000 | ORAL_TABLET | Freq: Four times a day (QID) | ORAL | 0 refills | Status: DC | PRN

## 2019-06-05 NOTE — ED Triage Notes (Signed)
Pt slipped on a blanket she had wrapped around her and injured her R ankle in process. R ankle swollen and pt unable to bear any weight.

## 2019-06-05 NOTE — ED Provider Notes (Addendum)
[facility] EMERGENCY DEPARTMENT Provider Note   CSN: [id number] Arrival date & time: 06/05/19  5053     History Chief Complaint  Patient presents with  . Ankle Pain    Kari Young is a 78 y.o. female.  Pt presents to the ED today with pain in her right lower leg.  The pt said she was watching a movie with her husband and had a blanket on her.  She went to get up and the blanket had twisted around her and she fell.  The pt denies any other injuries.  Pt unable to put any weight on the right leg.        Past Medical History:  Diagnosis Date  . Hypertension   . MRSA infection     Patient Active Problem List   Diagnosis Date Noted  . Near syncope 01/25/2018  . Bradycardia   . Chest tightness   . Hypertension 01/23/2018  . Sinus bradycardia by electrocardiogram 01/23/2018  . Postural dizziness with near syncope 01/23/2018  . Frequent falls 01/23/2018  . Thoracic compression fracture, closed, initial encounter (Kari Young) 01/23/2018  . Chronic diastolic CHF (congestive heart failure) ([name]) 01/23/2018  . COLLES' FRACTURE, LEFT 11/12/2009    Past Surgical History:  Procedure Laterality Date  . ABDOMINAL HYSTERECTOMY    . APPENDECTOMY    . NASAL SINUS SURGERY       OB History   No obstetric history on file.     Family History  Problem Relation Age of Onset  . Sudden Cardiac Death Neg Hx     Social History   Tobacco Use  . Smoking status: Never Smoker  . Smokeless tobacco: Never Used  Substance Use Topics  . Alcohol use: No  . Drug use: Never    Home Medications Prior to Admission medications   Medication Sig Start Date End Date Taking? Authorizing Provider  amLODipine (NORVASC) 5 MG tablet Take 5 mg by mouth daily.    [provider]  ARIPiprazole (ABILIFY) 5 MG tablet Take 5 mg by mouth daily. 05/23/19   [provider]  Ascorbic Acid (VITAMIN C PO) Take 1 tablet by mouth daily.    [provider]  Cholecalciferol 1000  UNITS tablet Take 1,000 Units by mouth daily.    [provider]  conjugated estrogens (PREMARIN) vaginal cream Place 1 Applicatorful vaginally every Monday, Wednesday, and Friday.    [provider]  Cyanocobalamin (VITAMIN B-12 PO) Take 1 tablet by mouth daily.    [provider]  fexofenadine (ALLEGRA) 180 MG tablet Take 180 mg by mouth daily.    [provider]  furosemide (LASIX) 20 MG tablet Take 20 mg by mouth.    [provider]  HYDROcodone-acetaminophen (NORCO/VICODIN) 5-325 MG tablet Take 1 tablet by mouth every 6 (six) hours as needed. 06/05/19   Kari Chapel, MD  hydrocortisone (ANUSOL-HC) 2.5 % rectal cream Place 1 application rectally every 6 (six) hours as needed for hemorrhoids or anal itching.    [provider]  magnesium oxide (MAG-OX) 400 MG tablet Take 400 mg by mouth daily.     [provider]  mirabegron ER (MYRBETRIQ) 50 MG TB24 tablet Take 50 mg by mouth daily.    [provider]  ondansetron (ZOFRAN ODT) 4 MG disintegrating tablet Take 1 tablet (4 mg total) by mouth every 8 (eight) hours as needed for nausea. 06/05/19   Kari Chapel, MD  pantoprazole (PROTONIX) 20 MG tablet Take 20 mg by mouth  daily. 05/23/19   [provider]  polyethylene glycol (MIRALAX / GLYCOLAX) packet Take 17 g by mouth 2 (two) times daily. 01/26/18   Young, Kari A, MD  potassium chloride (KLOR-CON) 10 MEQ tablet Take 1 tablet by mouth 2 (two) times daily. 05/23/19   [provider]  Probiotic Product (RISA-BID PROBIOTIC) TABS Take 1 tablet by mouth twice a day.    [provider]  senna-docusate (SENOKOT-S) 8.6-50 MG tablet Take 1 tablet by mouth daily. 01/27/18   Young, Kari A, MD  tolterodine (DETROL LA) 4 MG 24 hr capsule Take 4 mg by mouth daily. 05/23/19   [provider]  venlafaxine (EFFEXOR-XR) 150 MG 24 hr capsule Take 150 mg by mouth daily.    [provider]     Allergies    Percocet [oxycodone-acetaminophen] and Sulfamethoxazole  Review of Systems   Review of Systems  Musculoskeletal:       Right lower leg pain  All other systems reviewed and are negative.   Physical Exam Updated Vital Signs BP 131/84 (BP Location: Right Arm)   Pulse 90   Temp 98.7 F (37.1 C) (Oral)   Resp 15   Ht 5\' 3"  (1.6 m)   Wt 70.3 kg   SpO2 100%   BMI 27.46 kg/m   Physical Exam Vitals and nursing note reviewed.  Constitutional:      Appearance: Normal appearance.  HENT:     Head: Normocephalic and atraumatic.     Right Ear: External ear normal.     Left Ear: External ear normal.     Nose: Nose normal.     Mouth/Throat:     Mouth: Mucous membranes are moist.     Pharynx: Oropharynx is clear.  Eyes:     Extraocular Movements: Extraocular movements intact.     Conjunctiva/sclera: Conjunctivae normal.     Pupils: Pupils are equal, round, and reactive to light.  Cardiovascular:     Rate and Rhythm: Normal rate and regular rhythm.     Pulses: Normal pulses.     Heart sounds: Normal heart sounds.  Pulmonary:     Effort: Pulmonary effort is normal.     Breath sounds: Normal breath sounds.  Abdominal:     General: Abdomen is flat. Bowel sounds are normal.     Palpations: Abdomen is soft.  Musculoskeletal:     Cervical back: Normal range of motion and neck supple.       Legs:     Comments: Deformity to the distal tibia  Skin:    General: Skin is warm.     Capillary Refill: Capillary refill takes less than 2 seconds.  Neurological:     General: No focal deficit present.     Mental Status: She is alert and oriented to person, place, and time.  Psychiatric:        Mood and Affect: Mood normal.        Behavior: Behavior normal.     ED Results / Procedures / Treatments   Labs (all labs ordered are listed, but only abnormal results are displayed) Labs Reviewed - No data to display  EKG None  Radiology No results  found.  Procedures Procedures (including critical care time)  Medications Ordered in ED Medications  morphine 4 MG/ML injection 4 mg (4 mg Intravenous Given 06/05/19 0640)  ondansetron (ZOFRAN) injection 4 mg (4 mg Intravenous Given 06/05/19 0640)  fentaNYL (SUBLIMAZE) injection 50 mcg (50 mcg Intravenous Given 06/05/19 0801)  fentaNYL (SUBLIMAZE)  injection 50 mcg (50 mcg Intravenous Given 06/05/19 0938)  midazolam (VERSED) injection 2 mg (2 mg Intravenous Given 06/05/19 0939)  midazolam (VERSED) injection (2 mg Intravenous Given 06/05/19 0944)  fentaNYL (SUBLIMAZE) injection (50 mcg Intravenous Given 06/05/19 0943)  propofol (DIPRIVAN) 10 mg/mL bolus/IV push (20 mg Intravenous Given 06/05/19 0946)    ED Course  I have reviewed the triage vital signs and the nursing notes.  Pertinent labs & imaging results that were available during my care of the patient were reviewed by me and considered in my medical decision making (see chart for details).  Clinical Course as of Jun 13 1633  Sun Jun 05, 2019  0920 Initial reduction was unsuccessful at good alignment, will attempt again under some sedation.  Discussed with Dr. Susa SimmondsAdair of the orthopedic service, agreeable to the plan   [BM]    Clinical Course User Index [BM] Eber HongMiller, Brian, MD   MDM Rules/Calculators/A&P                      X-rays were pending at shift change.  Pt signed out to Dr. Hyacinth MeekerMiller.   Final Clinical Impression(s) / ED Diagnoses Final diagnoses:  Injury of right lower extremity, initial encounter  Closed bimalleolar fracture of right ankle, initial encounter    Rx / DC Orders ED Discharge Orders         Ordered    HYDROcodone-acetaminophen (NORCO/VICODIN) 5-325 MG tablet  Every 6 hours PRN     06/05/19 1038    ondansetron (ZOFRAN ODT) 4 MG disintegrating tablet  Every 8 hours PRN     06/05/19 1038           Jacalyn LefevreHaviland, Jadie Allington, MD 06/05/19 0701    Jacalyn LefevreHaviland, Lajuanda Penick, MD 06/14/19 1635

## 2019-06-05 NOTE — ED Notes (Signed)
Dr. Lucia Gaskins recalled for Dr. Sabra Heck.

## 2019-06-05 NOTE — Discharge Instructions (Signed)
I have spoken with Dr. Lucia Gaskins, the orthopedic surgeon on-call who has recommended that you follow-up in the office.  He will have the office contact you but if you have not heard from them by early afternoon tomorrow please call the office to arrange follow-up for early in the week.  This will likely need to be surgically repaired.  In the meantime please take the following precautions   Do not walk on your foot, you will need to use crutches or your walker to get around. Tylenol or ibuprofen for pain Keep the foot elevated If you have severe pain you may use Vicodin, 1 or 2 tablets every 6 hours as needed, be aware that this medication may make you constipated so take a stool softener if you are using it. Seek medical exam for severe or worsening pain swelling numbness or weakness.

## 2019-06-05 NOTE — ED Provider Notes (Signed)
Reduction of fracture  Date/Time: 06/05/2019 8:06 AM Performed by: Eber Hong, MD Authorized by: Eber Hong, MD  Consent: Verbal consent obtained. Risks and benefits: risks, benefits and alternatives were discussed Consent given by: patient Patient understanding: patient states understanding of the procedure being performed Required items: required blood products, implants, devices, and special equipment available Patient identity confirmed: verbally with patient Time out: Immediately prior to procedure a "time out" was called to verify the correct patient, procedure, equipment, support staff and site/side marked as required. Preparation: Patient was prepped and draped in the usual sterile fashion. Local anesthesia used: no  Anesthesia: Local anesthesia used: no  Sedation: Patient sedated: no  Patient tolerance: patient tolerated the procedure well with no immediate complications Comments: The patient was given 50 mcg of fentanyl.  She was in the supine position, her foot was elevated, traction was placed in the posterior anterior direction by holding the toes.  There was some improved reduction clinically, I personally immobilized the fracture as I reduced it.  This was done with the assistance of the nurse and the physician assistant.  Post reduction films have been ordered.  She was neurovascularly intact after splint placement.  Marland KitchenSplint Application  Date/Time: 06/05/2019 8:07 AM Performed by: Eber Hong, MD Authorized by: Eber Hong, MD   Consent:    Consent obtained:  Verbal   Consent given by:  Patient   Risks discussed:  Discoloration, numbness, pain and swelling Pre-procedure details:    Sensation:  Normal Procedure details:    Laterality:  Right   Location:  Ankle   Ankle:  R ankle   Splint type:  Short leg and ankle stirrup   Supplies:  Cotton padding, Ortho-Glass and elastic bandage Post-procedure details:    Pain:  Improved   Sensation:  Normal  Patient tolerance of procedure:  Tolerated well, no immediate complications Comments:     Minimal pain with reduction procedure .Sedation  Date/Time: 06/05/2019 9:59 AM Performed by: Eber Hong, MD Authorized by: Eber Hong, MD   Consent:    Consent obtained:  Verbal   Consent given by:  Patient   Risks discussed:  Allergic reaction, dysrhythmia, inadequate sedation, nausea, prolonged hypoxia resulting in organ damage, prolonged sedation necessitating reversal, respiratory compromise necessitating ventilatory assistance and intubation and vomiting   Alternatives discussed:  Analgesia without sedation, anxiolysis and regional anesthesia Universal protocol:    Procedure explained and questions answered to patient or proxy's satisfaction: yes     Relevant documents present and verified: yes     Test results available and properly labeled: yes     Imaging studies available: yes     Required blood products, implants, devices, and special equipment available: yes     Site/side marked: yes     Immediately prior to procedure a time out was called: yes     Patient identity confirmation method:  Verbally with patient Indications:    Procedure necessitating sedation performed by:  Physician performing sedation Pre-sedation assessment:    Time since last food or drink:  6 hours   ASA classification: class 2 - patient with mild systemic disease     Neck mobility: normal     Mouth opening:  3 or more finger widths   Thyromental distance:  4 finger widths   Mallampati score:  I - soft palate, uvula, fauces, pillars visible   Pre-sedation assessments completed and reviewed: airway patency, cardiovascular function, hydration status, mental status, nausea/vomiting, pain level, respiratory function and temperature  Pre-sedation assessment completed:  06/05/2019 9:30 AM Immediate pre-procedure details:    Reassessment: Patient reassessed immediately prior to procedure     Reviewed: vital  signs, relevant labs/tests and NPO status     Verified: bag valve mask available, emergency equipment available, intubation equipment available, IV patency confirmed, oxygen available and suction available   Procedure details (see MAR for exact dosages):    Preoxygenation:  Nasal cannula   Sedation:  Propofol and midazolam   Intended level of sedation: deep   Analgesia:  Fentanyl   Intra-procedure monitoring:  Blood pressure monitoring, cardiac monitor, continuous pulse oximetry, frequent LOC assessments, frequent vital sign checks and continuous capnometry   Intra-procedure events: none     Total Provider sedation time (minutes):  16 Post-procedure details:    Post-sedation assessment completed:  06/05/2019 10:00 AM   Attendance: Constant attendance by certified staff until patient recovered     Recovery: Patient returned to pre-procedure baseline     Post-sedation assessments completed and reviewed: airway patency, cardiovascular function, hydration status, mental status, nausea/vomiting, pain level, respiratory function and temperature     Patient is stable for discharge or admission: yes     Patient tolerance:  Tolerated well, no immediate complications Comments:     Total of 50 mcg of fentanyl, 2 mg of Versed and 20 mg of propofol    This patient presents after having a trip and fall last night when she got caught up in the blanket on the floor.  She injured her right ankle and noticed deformity pain and swelling.  On exam she has normal sensation to the foot, there is obvious lateral deformity consistent with having a ankle fracture.  I personally looked at the x-rays and find there to be at the minimum a bimalleolar fracture with an unstable tibiotalar joint.  This was reduced, post reduction films were ordered.  I have reviewed the post reduction films, there appears to be good anterior posterior and medial lateral alignment of the fragments.  The tibiotalar joint is realigned and  immobilization is appropriate  Discussed with orthopedics - Dr. Lucia Gaskins, who reccomends f/u in the office early this week and will have office manager arrange and coordinate with pt.  Final diagnoses:  Injury of right lower extremity, initial encounter  Closed bimalleolar fracture of right ankle, initial encounter      [name], MD 06/05/19 1040

## 2019-06-08 ENCOUNTER — Other Ambulatory Visit: Payer: Self-pay | Admitting: Orthopaedic Surgery

## 2019-06-15 ENCOUNTER — Encounter (HOSPITAL_BASED_OUTPATIENT_CLINIC_OR_DEPARTMENT_OTHER): Payer: Self-pay | Admitting: Orthopaedic Surgery

## 2019-06-15 ENCOUNTER — Other Ambulatory Visit: Payer: Self-pay

## 2019-06-15 ENCOUNTER — Ambulatory Visit: Payer: Medicare HMO | Admitting: Urology

## 2019-06-16 ENCOUNTER — Encounter (HOSPITAL_BASED_OUTPATIENT_CLINIC_OR_DEPARTMENT_OTHER)
Admission: RE | Admit: 2019-06-16 | Discharge: 2019-06-16 | Disposition: A | Discharge status: Home / Self Care | Payer: Medicare HMO | Source: Ambulatory Visit | Attending: Orthopaedic Surgery | Admitting: Orthopaedic Surgery

## 2019-06-16 ENCOUNTER — Other Ambulatory Visit (HOSPITAL_COMMUNITY)
Admission: RE | Admit: 2019-06-16 | Discharge: 2019-06-16 | Disposition: A | Discharge status: Home / Self Care | Payer: Medicare HMO | Source: Ambulatory Visit | Attending: Orthopaedic Surgery | Admitting: Orthopaedic Surgery

## 2019-06-16 DIAGNOSIS — I509 Heart failure, unspecified: Secondary | ICD-10-CM | POA: Diagnosis not present

## 2019-06-16 DIAGNOSIS — K219 Gastro-esophageal reflux disease without esophagitis: Secondary | ICD-10-CM | POA: Diagnosis not present

## 2019-06-16 DIAGNOSIS — I110 Hypertensive heart disease with heart failure: Secondary | ICD-10-CM | POA: Diagnosis not present

## 2019-06-16 DIAGNOSIS — Z79899 Other long term (current) drug therapy: Secondary | ICD-10-CM | POA: Diagnosis not present

## 2019-06-16 DIAGNOSIS — Z01818 Encounter for other preprocedural examination: Secondary | ICD-10-CM | POA: Insufficient documentation

## 2019-06-16 DIAGNOSIS — Z20822 Contact with and (suspected) exposure to covid-19: Secondary | ICD-10-CM | POA: Insufficient documentation

## 2019-06-16 DIAGNOSIS — Z01812 Encounter for preprocedural laboratory examination: Secondary | ICD-10-CM | POA: Diagnosis not present

## 2019-06-16 LAB — BASIC METABOLIC PANEL
Anion gap: 11 (ref 5–15)
BUN: 19 mg/dL (ref 8–23)
CO2: 24 mmol/L (ref 22–32)
Calcium: 9.5 mg/dL (ref 8.9–10.3)
Chloride: 107 mmol/L (ref 98–111)
Creatinine, Ser: 0.86 mg/dL (ref 0.44–1.00)
GFR calc Af Amer: >60 mL/min (ref >60)
GFR calc non Af Amer: >60 mL/min (ref >60)
Glucose, Bld: 92 mg/dL (ref 70–99)
Potassium: 4.8 mmol/L (ref 3.5–5.1)
Sodium: 142 mmol/L (ref 135–145)

## 2019-06-16 NOTE — Progress Notes (Signed)

## 2019-06-17 NOTE — Progress Notes (Signed)
EKG reviewed by Dr. Howze, will proceed with surgery as scheduled.  

## 2019-06-18 LAB — NOVEL CORONAVIRUS, NAA (HOSP ORDER, SEND-OUT TO REF LAB; TAT 18-24 HRS): SARS-CoV-2, NAA: NOT DETECTED

## 2019-06-19 NOTE — Anesthesia Preprocedure Evaluation (Addendum)
Anesthesia Evaluation  Patient identified by MRN, date of birth, ID band Patient awake    Reviewed: Allergy & Precautions, NPO status , Patient's Chart, lab work & pertinent test results  Airway Mallampati: II  TM Distance: >3 FB     Dental   Pulmonary neg pulmonary ROS,    breath sounds clear to auscultation       Cardiovascular hypertension, +CHF   Rhythm:Regular Rate:Normal     Neuro/Psych    GI/Hepatic Neg liver ROS, GERD  ,  Endo/Other  negative endocrine ROS  Renal/GU negative Renal ROS     Musculoskeletal   Abdominal   Peds  Hematology   Anesthesia Other Findings   Reproductive/Obstetrics                            Anesthesia Physical Anesthesia Plan  ASA: III  Anesthesia Plan: General   Post-op Pain Management:  Regional for Post-op pain   Induction: Intravenous  PONV Risk Score and Plan: 3  Airway Management Planned: Oral ETT  Additional Equipment:   Intra-op Plan:   Post-operative Plan:   Informed Consent: I have reviewed the patients History and Physical, chart, labs and discussed the procedure including the risks, benefits and alternatives for the proposed anesthesia with the patient or authorized representative who has indicated his/her understanding and acceptance.     Dental advisory given  Plan Discussed with: Anesthesiologist and CRNA  Anesthesia Plan Comments:        Anesthesia Quick Evaluation

## 2019-06-20 ENCOUNTER — Encounter (HOSPITAL_BASED_OUTPATIENT_CLINIC_OR_DEPARTMENT_OTHER)
Admission: RE | Disposition: A | Discharge status: Home / Self Care | Payer: Self-pay | Source: Home / Self Care | Attending: Orthopaedic Surgery

## 2019-06-20 ENCOUNTER — Encounter (HOSPITAL_BASED_OUTPATIENT_CLINIC_OR_DEPARTMENT_OTHER): Payer: Self-pay | Admitting: Orthopaedic Surgery

## 2019-06-20 ENCOUNTER — Ambulatory Visit (HOSPITAL_BASED_OUTPATIENT_CLINIC_OR_DEPARTMENT_OTHER)
Admission: RE | Admit: 2019-06-20 | Discharge: 2019-06-20 | Disposition: A | Discharge status: Home / Self Care | Payer: Medicare HMO | Attending: Orthopaedic Surgery | Admitting: Orthopaedic Surgery

## 2019-06-20 ENCOUNTER — Other Ambulatory Visit: Payer: Self-pay | Admitting: Family Medicine

## 2019-06-20 ENCOUNTER — Ambulatory Visit (HOSPITAL_BASED_OUTPATIENT_CLINIC_OR_DEPARTMENT_OTHER): Payer: Medicare HMO | Admitting: Anesthesiology

## 2019-06-20 ENCOUNTER — Ambulatory Visit (HOSPITAL_COMMUNITY): Payer: Medicare HMO

## 2019-06-20 ENCOUNTER — Other Ambulatory Visit: Payer: Self-pay

## 2019-06-20 DIAGNOSIS — Z79890 Hormone replacement therapy (postmenopausal): Secondary | ICD-10-CM | POA: Insufficient documentation

## 2019-06-20 DIAGNOSIS — I509 Heart failure, unspecified: Secondary | ICD-10-CM | POA: Insufficient documentation

## 2019-06-20 DIAGNOSIS — F329 Major depressive disorder, single episode, unspecified: Secondary | ICD-10-CM | POA: Diagnosis not present

## 2019-06-20 DIAGNOSIS — N3281 Overactive bladder: Secondary | ICD-10-CM | POA: Diagnosis not present

## 2019-06-20 DIAGNOSIS — I110 Hypertensive heart disease with heart failure: Secondary | ICD-10-CM | POA: Insufficient documentation

## 2019-06-20 DIAGNOSIS — K219 Gastro-esophageal reflux disease without esophagitis: Secondary | ICD-10-CM | POA: Diagnosis not present

## 2019-06-20 DIAGNOSIS — W010XXA Fall on same level from slipping, tripping and stumbling without subsequent striking against object, initial encounter: Secondary | ICD-10-CM | POA: Diagnosis not present

## 2019-06-20 DIAGNOSIS — F419 Anxiety disorder, unspecified: Secondary | ICD-10-CM | POA: Insufficient documentation

## 2019-06-20 DIAGNOSIS — Z79899 Other long term (current) drug therapy: Secondary | ICD-10-CM | POA: Diagnosis not present

## 2019-06-20 DIAGNOSIS — Z4789 Encounter for other orthopedic aftercare: Secondary | ICD-10-CM

## 2019-06-20 DIAGNOSIS — S82851A Displaced trimalleolar fracture of right lower leg, initial encounter for closed fracture: Secondary | ICD-10-CM | POA: Diagnosis present

## 2019-06-20 HISTORY — DX: Gastro-esophageal reflux disease without esophagitis: K21.9

## 2019-06-20 HISTORY — DX: Depression, unspecified: F32.A

## 2019-06-20 HISTORY — DX: Other fracture of right lower leg, initial encounter for closed fracture: S82.891A

## 2019-06-20 HISTORY — DX: Overactive bladder: N32.81

## 2019-06-20 HISTORY — PX: ORIF ANKLE FRACTURE: SHX5408

## 2019-06-20 HISTORY — DX: Heart failure, unspecified: I50.9

## 2019-06-20 HISTORY — DX: Anxiety disorder, unspecified: F41.9

## 2019-06-20 SURGERY — OPEN REDUCTION INTERNAL FIXATION (ORIF) ANKLE FRACTURE
Anesthesia: General | Site: Ankle | Laterality: Right

## 2019-06-20 MED ORDER — POVIDONE-IODINE 10 % EX SWAB: 2.0000 "application " | Freq: Once | CUTANEOUS | Status: DC

## 2019-06-20 MED ORDER — DEXAMETHASONE SODIUM PHOSPHATE 10 MG/ML IJ SOLN
INTRAMUSCULAR | Status: AC
  Filled 2019-06-20: qty 1

## 2019-06-20 MED ORDER — BUPIVACAINE HCL (PF) 0.5 % IJ SOLN
INTRAMUSCULAR | Status: AC
  Filled 2019-06-20: qty 30

## 2019-06-20 MED ORDER — GLYCOPYRROLATE 0.2 MG/ML IJ SOLN
INTRAMUSCULAR | Status: DC | PRN
  Administered 2019-06-20: .2 mg via INTRAVENOUS

## 2019-06-20 MED ORDER — PROPOFOL 10 MG/ML IV BOLUS
INTRAVENOUS | Status: AC
  Filled 2019-06-20: qty 20

## 2019-06-20 MED ORDER — HYDROCODONE-ACETAMINOPHEN 5-325 MG PO TABS: 1.0000 | ORAL_TABLET | ORAL | 0 refills | Status: AC | PRN

## 2019-06-20 MED ORDER — ONDANSETRON HCL 4 MG/2ML IJ SOLN
INTRAMUSCULAR | Status: DC | PRN
  Administered 2019-06-20: 4 mg via INTRAVENOUS

## 2019-06-20 MED ORDER — DEXAMETHASONE SODIUM PHOSPHATE 4 MG/ML IJ SOLN
INTRAMUSCULAR | Status: DC | PRN
  Administered 2019-06-20: 5 mg via INTRAVENOUS

## 2019-06-20 MED ORDER — CEFAZOLIN SODIUM-DEXTROSE 2-4 GM/100ML-% IV SOLN
INTRAVENOUS | Status: AC
  Filled 2019-06-20: qty 100

## 2019-06-20 MED ORDER — LIDOCAINE 2% (20 MG/ML) 5 ML SYRINGE
INTRAMUSCULAR | Status: AC
  Filled 2019-06-20: qty 5

## 2019-06-20 MED ORDER — FENTANYL CITRATE (PF) 100 MCG/2ML IJ SOLN
INTRAMUSCULAR | Status: AC
  Filled 2019-06-20: qty 2

## 2019-06-20 MED ORDER — SUCCINYLCHOLINE CHLORIDE 20 MG/ML IJ SOLN
INTRAMUSCULAR | Status: DC | PRN
  Administered 2019-06-20: 140 mg via INTRAVENOUS

## 2019-06-20 MED ORDER — GLYCOPYRROLATE PF 0.2 MG/ML IJ SOSY
PREFILLED_SYRINGE | INTRAMUSCULAR | Status: AC
  Filled 2019-06-20: qty 1

## 2019-06-20 MED ORDER — MIDAZOLAM HCL 2 MG/2ML IJ SOLN
INTRAMUSCULAR | Status: AC
  Filled 2019-06-20: qty 2

## 2019-06-20 MED ORDER — FENTANYL CITRATE (PF) 100 MCG/2ML IJ SOLN
25.0000 ug | INTRAMUSCULAR | Status: DC | PRN
  Administered 2019-06-20 (×3): 50 ug via INTRAVENOUS

## 2019-06-20 MED ORDER — MIDAZOLAM HCL 2 MG/2ML IJ SOLN
1.0000 mg | INTRAMUSCULAR | Status: DC | PRN
  Administered 2019-06-20: 07:00:00 0.5 mg via INTRAVENOUS
  Administered 2019-06-20: 07:00:00 1 mg via INTRAVENOUS

## 2019-06-20 MED ORDER — ONDANSETRON HCL 4 MG/2ML IJ SOLN
INTRAMUSCULAR | Status: AC
  Filled 2019-06-20: qty 2

## 2019-06-20 MED ORDER — LIDOCAINE HCL (CARDIAC) PF 100 MG/5ML IV SOSY
PREFILLED_SYRINGE | INTRAVENOUS | Status: DC | PRN
  Administered 2019-06-20: 80 mg via INTRAVENOUS

## 2019-06-20 MED ORDER — ASPIRIN 325 MG PO TABS: 325.0000 mg | ORAL_TABLET | Freq: Every day | ORAL | 0 refills | Status: AC

## 2019-06-20 MED ORDER — PROPOFOL 10 MG/ML IV BOLUS
INTRAVENOUS | Status: DC | PRN
  Administered 2019-06-20: 30 mg via INTRAVENOUS
  Administered 2019-06-20: 120 mg via INTRAVENOUS

## 2019-06-20 MED ORDER — LACTATED RINGERS IV SOLN: INTRAVENOUS | Status: DC

## 2019-06-20 MED ORDER — METHOCARBAMOL 500 MG PO TABS: 500.0000 mg | ORAL_TABLET | Freq: Three times a day (TID) | ORAL | 0 refills | Status: AC | PRN

## 2019-06-20 MED ORDER — FENTANYL CITRATE (PF) 100 MCG/2ML IJ SOLN
50.0000 ug | INTRAMUSCULAR | Status: AC | PRN
  Administered 2019-06-20: 07:00:00 50 ug via INTRAVENOUS
  Administered 2019-06-20 (×3): 25 ug via INTRAVENOUS

## 2019-06-20 MED ORDER — CEFAZOLIN SODIUM-DEXTROSE 2-4 GM/100ML-% IV SOLN
2.0000 g | INTRAVENOUS | Status: AC
  Administered 2019-06-20: 08:00:00 2 g via INTRAVENOUS

## 2019-06-20 SURGICAL SUPPLY — 75 items
APL PRP STRL LF DISP 70% ISPRP (MISCELLANEOUS) ×2
APL SKNCLS STERI-STRIP NONHPOA (GAUZE/BANDAGES/DRESSINGS)
BANDAGE ESMARK 6X9 LF (GAUZE/BANDAGES/DRESSINGS) ×2 IMPLANT
BENZOIN TINCTURE PRP APPL 2/3 (GAUZE/BANDAGES/DRESSINGS) IMPLANT
BIT DRILL 2 CANN GRADUATED (BIT) ×3 IMPLANT
BIT DRILL 2.5 CANN LNG (BIT) ×3 IMPLANT
BIT DRILL 2.6 CANN (BIT) ×3 IMPLANT
BLADE SURG 15 STRL LF DISP TIS (BLADE) ×4 IMPLANT
BLADE SURG 15 STRL SS (BLADE) ×8
BNDG CMPR 9X6 STRL LF SNTH (GAUZE/BANDAGES/DRESSINGS) ×2
BNDG COHESIVE 4X5 TAN STRL (GAUZE/BANDAGES/DRESSINGS) IMPLANT
BNDG ELASTIC 6X5.8 VLCR STR LF (GAUZE/BANDAGES/DRESSINGS) ×8 IMPLANT
BNDG ESMARK 6X9 LF (GAUZE/BANDAGES/DRESSINGS) ×4
CHLORAPREP W/TINT 26 (MISCELLANEOUS) ×4 IMPLANT
CLOSURE WOUND 1/2 X4 (GAUZE/BANDAGES/DRESSINGS)
COVER BACK TABLE 60X90IN (DRAPES) ×4 IMPLANT
COVER WAND RF STERILE (DRAPES) IMPLANT
CUFF TOURN SGL QUICK 34 (TOURNIQUET CUFF) ×4
CUFF TRNQT CYL 34X4.125X (TOURNIQUET CUFF) ×2 IMPLANT
DECANTER SPIKE VIAL GLASS SM (MISCELLANEOUS) IMPLANT
DRAPE C-ARM 42X72 X-RAY (DRAPES) ×3 IMPLANT
DRAPE C-ARMOR (DRAPES) IMPLANT
DRAPE EXTREMITY T 121X128X90 (DISPOSABLE) ×4 IMPLANT
DRAPE HALF SHEET 70X43 (DRAPES) ×4 IMPLANT
DRAPE IMP U-DRAPE 54X76 (DRAPES) ×4 IMPLANT
DRAPE U-SHAPE 47X51 STRL (DRAPES) ×4 IMPLANT
ELECT REM PT RETURN 9FT ADLT (ELECTROSURGICAL) ×4
ELECTRODE REM PT RTRN 9FT ADLT (ELECTROSURGICAL) ×2 IMPLANT
GAUZE SPONGE 4X4 12PLY STRL (GAUZE/BANDAGES/DRESSINGS) ×4 IMPLANT
GAUZE XEROFORM 1X8 LF (GAUZE/BANDAGES/DRESSINGS) ×4 IMPLANT
GLOVE BIOGEL M STRL SZ7.5 (GLOVE) ×4 IMPLANT
GLOVE BIOGEL PI IND STRL 8 (GLOVE) ×2 IMPLANT
GLOVE BIOGEL PI INDICATOR 8 (GLOVE) ×2
GLOVE ECLIPSE 6.5 STRL STRAW (GLOVE) ×3 IMPLANT
GOWN STRL REUS W/ TWL LRG LVL3 (GOWN DISPOSABLE) ×2 IMPLANT
GOWN STRL REUS W/ TWL XL LVL3 (GOWN DISPOSABLE) ×2 IMPLANT
GOWN STRL REUS W/TWL LRG LVL3 (GOWN DISPOSABLE) ×4
GOWN STRL REUS W/TWL XL LVL3 (GOWN DISPOSABLE) ×4
GUIDEWIRE 1.35MM (WIRE) ×6 IMPLANT
GUIDEWIRE 1.6 (WIRE) ×4
GUIDEWIRE ORTH 157X1.6XTROC (WIRE) ×1 IMPLANT
NS IRRIG 1000ML POUR BTL (IV SOLUTION) ×4 IMPLANT
PACK BASIN DAY SURGERY FS (CUSTOM PROCEDURE TRAY) ×4 IMPLANT
PAD CAST 4YDX4 CTTN HI CHSV (CAST SUPPLIES) ×2 IMPLANT
PADDING CAST COTTON 4X4 STRL (CAST SUPPLIES) ×4
PADDING CAST SYNTHETIC 4 (CAST SUPPLIES) ×4
PADDING CAST SYNTHETIC 4X4 STR (CAST SUPPLIES) ×4 IMPLANT
PENCIL SMOKE EVACUATOR (MISCELLANEOUS) ×4 IMPLANT
PLATE 5HOLE LOCKING 91MML (Plate) ×3 IMPLANT
SCREW CANCELLOUS 3MM 3X14MM (Screw) ×3 IMPLANT
SCREW CANCELLOUS 4X14 (Screw) ×3 IMPLANT
SCREW LOCKING 2.7X12 ANKLE (Screw) ×6 IMPLANT
SCREW LOCKING 2.7X14MM (Screw) ×6 IMPLANT
SCREW LOW PROFILE 3.5X16 (Screw) ×3 IMPLANT
SCREW LOW PROFILE 4.0X40 (Screw) ×6 IMPLANT
SCREW NON-LOCKING 3.5X12MM (Screw) ×6 IMPLANT
SLEEVE SCD COMPRESS KNEE MED (MISCELLANEOUS) ×4 IMPLANT
SPLINT FAST PLASTER 5X30 (CAST SUPPLIES) ×40
SPLINT PLASTER CAST FAST 5X30 (CAST SUPPLIES) ×40 IMPLANT
SPONGE LAP 18X18 RF (DISPOSABLE) ×3 IMPLANT
STAPLER VISISTAT 35W (STAPLE) IMPLANT
STOCKINETTE 6  STRL (DRAPES) ×2
STOCKINETTE 6 STRL (DRAPES) ×2 IMPLANT
STRIP CLOSURE SKIN 1/2X4 (GAUZE/BANDAGES/DRESSINGS) IMPLANT
SUCTION FRAZIER HANDLE 10FR (MISCELLANEOUS) ×2
SUCTION TUBE FRAZIER 10FR DISP (MISCELLANEOUS) ×2 IMPLANT
SUT ETHILON 3 0 PS 1 (SUTURE) ×7 IMPLANT
SUT MNCRL AB 3-0 PS2 18 (SUTURE) ×7 IMPLANT
SUT PDS AB 2-0 CT2 27 (SUTURE) ×1 IMPLANT
SUT VIC AB 3-0 FS2 27 (SUTURE) IMPLANT
SYR BULB 3OZ (MISCELLANEOUS) ×4 IMPLANT
TOWEL GREEN STERILE FF (TOWEL DISPOSABLE) ×8 IMPLANT
TUBE CONNECTING 20'X1/4 (TUBING) ×1
TUBE CONNECTING 20X1/4 (TUBING) ×3 IMPLANT
UNDERPAD 30X36 HEAVY ABSORB (UNDERPADS AND DIAPERS) ×4 IMPLANT

## 2019-06-20 NOTE — Transfer of Care (Signed)
Immediate Anesthesia Transfer of Care Note  Patient: Kari Young  Procedure(s) Performed: OPEN REDUCTION INTERNAL FIXATION (ORIF) TRIMALLEOLAR ANKLE FRACTURE (Right Ankle) SYNDESMOSIS REPAIR (Right Ankle)  Patient Location: PACU  Anesthesia Type:General  Level of Consciousness: awake, alert  and oriented  Airway & Oxygen Therapy: Patient Spontanous Breathing and Patient connected to nasal cannula oxygen  Post-op Assessment: Report given to RN and Post -op Vital signs reviewed and stable  Post vital signs: Reviewed and stable  Last Vitals:  Vitals Value Taken Time  BP 156/85 06/20/19 0917  Temp    Pulse 95 06/20/19 0917  Resp 14 06/20/19 0917  SpO2 100 % 06/20/19 0917  Vitals shown include unvalidated device data.  Last Pain:  Vitals:   06/20/19 0730  TempSrc:   PainSc: 0-No pain         Complications: No apparent anesthesia complications

## 2019-06-20 NOTE — Anesthesia Procedure Notes (Signed)
Anesthesia Regional Block: Popliteal block   Pre-Anesthetic Checklist: ,, timeout performed, Correct Patient, Correct Site, Correct Laterality, Correct Procedure, Correct Position, site marked, Risks and benefits discussed,  Surgical consent,  Pre-op evaluation,  At surgeon's request and post-op pain management  Laterality: Right  Prep: chloraprep       Needles:  Injection technique: Single-shot  Needle Type: Echogenic Stimulator Needle          Additional Needles:   Procedures: Doppler guided, nerve stimulator,,, ultrasound used (permanent image in chart),,,,   Nerve Stimulator or Paresthesia:  Response: 0.5 mA,   Additional Responses:   Narrative:  Start time: 06/20/2019 6:55 AM End time: 06/20/2019 7:10 AM Injection made incrementally with aspirations every 5 mL.  Performed by: Personally  Anesthesiologist: Dorris Singh, MD

## 2019-06-20 NOTE — Progress Notes (Signed)
Assisted Dr. Green with right, ultrasound guided, popliteal block. Side rails up, monitors on throughout procedure. See vital signs in flow sheet. Tolerated Procedure well. 

## 2019-06-20 NOTE — Op Note (Signed)
Kari Young female 79 y.o. 06/20/2019  PreOperative Diagnosis: Right trimalleolar ankle fracture  PostOperative Diagnosis: Same  PROCEDURE: Open reduction internal fixation of right trimalleolar ankle fracture without posterior fixation  SURGEON: Melony Overly, MD  ASSISTANT: None  ANESTHESIA: General endotracheal tube with peripheral nerve blockade  FINDINGS: Right trimalleolar ankle fracture, displaced  IMPLANTS: Arthrex distal fibular locking plate 4.0 mm partially-threaded cannulated screws  INDICATIONS:78 y.o. female fell on December 27 and sustained the above fracture.  She was seen in the emergency department where closed reduction was performed.  Given the nature of her fracture and instability she was indicated for open treatment.  She understood the risk benefits alternatives to surgery which include but are not limited to wound healing complications, infection, nonunion, malunion, need for further surgery as well as damage to surrounding structures and development of arthritis and continued pain.  After close reduction in the emergency department a splint was placed.  When she presented to the preoperative holding area there was fracture blistering about the lateral and medial side as well as the dorsal midfoot.  There was a small area of continued compression on the dorsal midfoot.  There is no eschar there but the skin was quite white with some surrounding erythema consistent with vascular compromise.  She understood that this may develop into an eschar and need further treatment.  She wished to proceed with surgery.  PROCEDURE:Patient was identified the preoperative holding area.  The right leg was marked myself.  Consent was signed myself and the patient.  Peripheral nerve block was performed by anesthesia.  Patient was taken to the operative suite and placed supine on the operative table.  General anesthesia was induced out difficulty.  Preoperative  antibiotics were given.  Thigh tourniquet was placed on the lright thigh and a bump was placed under the right hip. Bone foam was used.  All bony prominences well-padded.  Surgical timeout was performed.  The operative extremity was then elevated and the tourniquet inflated to 250 mmHg.  We began by making a longitudinal incision overlying the distal fibula.  This was taken sharply down through skin and subcutaneous tissue.  Blunt dissection was used to identify any branch of the superficial peroneal nerve which was not identified within the surgical field.  The incision was then taken sharply down to bone and the fracture site was identified.  The fracture site was mobilized.   The fracture site were cleaned with a rondure and curette of any fracture hematoma and callus formation.  Then the fracture of the fibula was reduced under direct visualization and held provisionally with a lobster claw.  Then fluoroscopy confirmed adequate reduction of the ankle mortise at that time.  Then a combination of locking and nonlocking screws were used. This provided good stability of the distal fibula fracture.  We then turned our attention to the medial malleolus.  There was continued displacement of the medial malleolus and therefore an incision was made overlying this.  This was taken sharply down through skin and subcutaneous tissue.  Bovie cautery was used for skin bleeders.  Then sharp dissection down to the fracture and the fracture site was identified.  The soft tissue flap was carried anteriorly to identify the medial gutter of the ankle joint.  Then the fracture site was mobilized and using a curette and rondure the fracture was cleared out of hematoma and fracture callus.  Then the fracture was reduced and held provisionally with a pointed reduction forcep.  This was  done under direct visualization.  Then fluoroscopy confirmed adequate reduction.  2 4.0 mm partially-threaded cannulated screws were placed across the  fracture site with good fixation.  Then under fluoroscopy the ankle was stressed and found to be stable with regard to medial clear space widening or syndesmotic widening.  On the lateral view the posterior malleolar fragment was small and was adequately reduced.  The wounds were irrigated and the deep tissue was closed with a 3-0 Monocryl.  The tourniquet was released.  Hemostasis was obtained.  The subcuticular tissue was closed with 3-0 Monocryl and the skin with 2-0 nylon.  Xeroform placed on the wounds as well as the area of blistering and apparent pressure from the prior splint on the dorsal foot.  Adaptic was also placed.  Then 4 x 4's and sterile sheet cotton.  She was placed in a nonweightbearing short leg splint.  She tolerated this well.  There were no complications.  She was awakened from anesthesia and taken recovery in stable condition.  POST OPERATIVE INSTRUCTIONS: Nonweightbearing to right lower extremity Keep splint dry and limb elevated Continue 325 mg aspirin for DVT prophylaxis Call the office with concerns She will follow-up in 2 weeks for splint removal, x-rays of the right ankle nonweightbearing and suture removal if appropriate.  BLOOD LOSS:  less than 50 mL         DRAINS: none         SPECIMEN: none       COMPLICATIONS:  * No complications entered in OR log *         Disposition: PACU - hemodynamically stable.         Condition: stable

## 2019-06-20 NOTE — Discharge Instructions (Signed)
DR. [facility] POST-OP INSTRUCTIONS   Pain Management 1. The numbing medicine and your leg will last around 18 hours, take a dose of your pain medicine as soon as you feel it wearing off to avoid rebound pain. 2. Keep your foot elevated above heart level.  Make sure that your heel hangs free ('floats'). 3. Take all prescribed medication as directed. 4. If taking narcotic pain medication you may want to use an over-the-counter stool softener to avoid constipation. 5. You may take over-the-counter NSAIDs (ibuprofen, naproxen, etc.) as well as over-the-counter acetaminophen as directed on the packaging as a supplement for your pain and may also use it to wean away from the prescription medication.  Activity ? Non-weightbearing ? Keep splint intact  First Postoperative Visit 1. Your first postop visit will be at least 2 weeks after surgery.  This should be scheduled when you schedule surgery. 2. If you do not have a postoperative visit scheduled please call [phone] to schedule an appointment. 3. At the appointment your incision will be evaluated for suture removal, x-rays will be obtained if necessary.  General Instructions 1. Swelling is very common after foot and ankle surgery.  It often takes 3 months for the foot and ankle to begin to feel comfortable.  Some amount of swelling will persist for 6-12 months. 2. DO NOT change the dressing.  If there is a problem with the dressing (too tight, loose, gets wet, etc.) please contact Dr. Adair's office. 3. DO NOT get the dressing wet.  For showers you can use an over-the-counter cast cover or wrap a washcloth around the top of your dressing and then cover it with a plastic bag and tape it to your leg. 4. DO NOT soak the incision (no tubs, pools, bath, etc.) until you have approval from Dr. Adair.  Contact Dr. Adairs office or go to Emergency Room if: 1. Temperature above 101 F. 2. Increasing pain that is unresponsive to pain  medication or elevation 3. Excessive redness or swelling in your foot 4. Dressing problems - excessive bloody drainage, looseness or tightness, or if dressing gets wet 5. Develop pain, swelling, warmth, or discoloration of your calf   Post Anesthesia Home Care Instructions  Activity: Get plenty of rest for the remainder of the day. A responsible individual must stay with you for 24 hours following the procedure.  For the next 24 hours, DO NOT: -Drive a car -Operate machinery -Drink alcoholic beverages -Take any medication unless instructed by your physician -Make any legal decisions or sign important papers.  Meals: Start with liquid foods such as gelatin or soup. Progress to regular foods as tolerated. Avoid greasy, spicy, heavy foods. If nausea and/or vomiting occur, drink only clear liquids until the nausea and/or vomiting subsides. Call your physician if vomiting continues.  Special Instructions/Symptoms: Your throat may feel dry or sore from the anesthesia or the breathing tube placed in your throat during surgery. If this causes discomfort, gargle with warm salt water. The discomfort should disappear within 24 hours.  If you had a scopolamine patch placed behind your ear for the management of post- operative nausea and/or vomiting:  1. The medication in the patch is effective for 72 hours, after which it should be removed.  Wrap patch in a tissue and discard in the trash. Wash hands thoroughly with soap and water. 2. You may remove the patch earlier than 72 hours if you experience unpleasant side effects which may include dry mouth, dizziness   or visual disturbances. 3. Avoid touching the patch. Wash your hands with soap and water after contact with the patch.     Regional Anesthesia Blocks  1. Numbness or the inability to move the "blocked" extremity may last from 3-48 hours after placement. The length of time depends on the medication injected and your individual response to  the medication. If the numbness is not going away after 48 hours, call your surgeon.  2. The extremity that is blocked will need to be protected until the numbness is gone and the  Strength has returned. Because you cannot feel it, you will need to take extra care to avoid injury. Because it may be weak, you may have difficulty moving it or using it. You may not know what position it is in without looking at it while the block is in effect.  3. For blocks in the legs and feet, returning to weight bearing and walking needs to be done carefully. You will need to wait until the numbness is entirely gone and the strength has returned. You should be able to move your leg and foot normally before you try and bear weight or walk. You will need someone to be with you when you first try to ensure you do not fall and possibly risk injury.  4. Bruising and tenderness at the needle site are common side effects and will resolve in a few days.  5. Persistent numbness or new problems with movement should be communicated to the surgeon or the [facility] ([phone])/ [facility] ([phone]).  

## 2019-06-20 NOTE — Anesthesia Procedure Notes (Signed)
Procedure Name: Intubation Date/Time: 06/20/2019 7:43 AM Performed by: Cleda Clarks, CRNA Pre-anesthesia Checklist: Patient identified, Emergency Drugs available, Suction available and Patient being monitored Patient Re-evaluated:Patient Re-evaluated prior to induction Oxygen Delivery Method: Circle system utilized Preoxygenation: Pre-oxygenation with 100% oxygen Induction Type: IV induction Ventilation: Mask ventilation without difficulty Laryngoscope Size: Miller and 2 Grade View: Grade II Tube type: Oral Tube size: 7.0 mm Number of attempts: 1 Airway Equipment and Method: Stylet and Oral airway Placement Confirmation: ETT inserted through vocal cords under direct vision,  positive ETCO2 and breath sounds checked- equal and bilateral Secured at: 21 cm Tube secured with: Tape Dental Injury: Teeth and Oropharynx as per pre-operative assessment

## 2019-06-20 NOTE — H&P (Signed)
Kari Young is an 79 y.o. female.   Chief Complaint: Right trimalleolar ankle fracture HPI: Kari Young is a 79 year old female who on 06/05/2019 slipped and fell and had immediate pain and deformity in her right ankle.  She was seen in the emergency department and diagnosed with a trimalleolar ankle fracture.  She underwent closed reduction in the emergency department and splinting.  She is here today for surgical correction.  She has been maintaining nonweightbearing and her pain has been tolerable.  She is noted a blister on the dorsal aspect of her foot distal to the splint.  She feels that the splint feels tight.  She denies any symptomatic numbness or tingling.  Denies any recent fevers or chills.  No other joint or extremity pain today.  Past Medical History:  Diagnosis Date  . Anxiety   . CHF (congestive heart failure) ([place])   . Closed right ankle fracture   . Depression   . GERD (gastroesophageal reflux disease)   . Hypertension   . MRSA infection   . OAB (overactive bladder)     Past Surgical History:  Procedure Laterality Date  . ABDOMINAL HYSTERECTOMY    . APPENDECTOMY    . NASAL SINUS SURGERY      Family History  Problem Relation Age of Onset  . Sudden Cardiac Death Neg Hx    Social History:  reports that she has never smoked. She has never used smokeless tobacco. She reports that she does not drink alcohol or use drugs.  Allergies:  Allergies  Allergen Reactions  . Percocet [Oxycodone-Acetaminophen] Itching  . Sulfamethoxazole Nausea Only    Medications Prior to Admission  Medication Sig Dispense Refill  . amLODipine (NORVASC) 5 MG tablet Take 5 mg by mouth daily.    . ARIPiprazole (ABILIFY) 5 MG tablet Take 5 mg by mouth daily.    . Ascorbic Acid (VITAMIN C PO) Take 1 tablet by mouth daily.    . Cholecalciferol 1000 UNITS tablet Take 1,000 Units by mouth daily.    Marland Kitchen conjugated estrogens (PREMARIN) vaginal cream Place 1 Applicatorful vaginally every  Monday, Wednesday, and Friday.    . Cyanocobalamin (VITAMIN B-12 PO) Take 1 tablet by mouth daily.    . fexofenadine (ALLEGRA) 180 MG tablet Take 180 mg by mouth daily.    . furosemide (LASIX) 20 MG tablet Take 20 mg by mouth.    Marland Kitchen HYDROcodone-acetaminophen (NORCO/VICODIN) 5-325 MG tablet Take 1 tablet by mouth every 6 (six) hours as needed. 10 tablet 0  . magnesium oxide (MAG-OX) 400 MG tablet Take 400 mg by mouth daily.     . mirabegron ER (MYRBETRIQ) 50 MG TB24 tablet Take 50 mg by mouth daily.    . pantoprazole (PROTONIX) 20 MG tablet Take 20 mg by mouth daily.    . polyethylene glycol (MIRALAX / GLYCOLAX) packet Take 17 g by mouth 2 (two) times daily. 14 each 0  . potassium chloride (KLOR-CON) 10 MEQ tablet Take 1 tablet by mouth 2 (two) times daily.    . Probiotic Product (RISA-BID PROBIOTIC) TABS Take 1 tablet by mouth twice a day.    . senna-docusate (SENOKOT-S) 8.6-50 MG tablet Take 1 tablet by mouth daily. 30 tablet 0  . tolterodine (DETROL LA) 4 MG 24 hr capsule Take 4 mg by mouth daily.    Marland Kitchen venlafaxine (EFFEXOR-XR) 150 MG 24 hr capsule Take 150 mg by mouth daily.    . hydrocortisone (ANUSOL-HC) 2.5 % rectal cream Place 1 application rectally every 6 (  six) hours as needed for hemorrhoids or anal itching.    . ondansetron (ZOFRAN ODT) 4 MG disintegrating tablet Take 1 tablet (4 mg total) by mouth every 8 (eight) hours as needed for nausea. 10 tablet 0    No results found for this or any previous visit (from the past 48 hour(s)). No results found.  Review of Systems  Constitutional: Negative.   HENT: Negative.   Eyes: Negative.   Respiratory: Negative.   Cardiovascular: Negative.   Gastrointestinal: Negative.   Musculoskeletal:       Right ankle pain  Skin:       Foot blister  Neurological: Negative.   Hematological: Negative.   Psychiatric/Behavioral: Negative.     Blood pressure 124/65, pulse 76, temperature (!) 96 F (35.6 C), temperature source Tympanic, resp. rate  18, height 5\' 3"  (1.6 m), weight 79 kg, SpO2 100 %. Physical Exam  Constitutional: She appears well-developed.  HENT:  Head: Normocephalic.  Eyes: Conjunctivae are normal.  Cardiovascular: Normal rate.  Respiratory: Effort normal.  GI: Soft.  Musculoskeletal:     Comments: Right foot demonstrates short leg splint in place.  There is a dorsal blister about the foot just distal to the splint.  Toes exposed are warm and well-perfused.  Endorses SILT about toes. She able to wiggle toes.  No tenderness palpation proximal to the splint.  Clinically the ankle appears well aligned.  Skin:  There is a fracture blister distal to the splint on the dorsal foot.  Psychiatric: She has a normal mood and affect.     Assessment/Plan We will proceed with open treatment of her right trimalleolar ankle fracture.  This will be without posterior fixation if we are able to adequately reduce the posterior malleolar fragment which appears to be small.  She understands that she will maintain nonweightbearing postoperatively for approximately 6 weeks.  She also understands the risk, benefits and alternatives of surgery which include but are not limited to wound healing complications, infection, nonunion, malunion, need for further surgery and damage to surrounding structures.  She also understands the perioperative and anesthetic risk which include death.  She would like to proceed with surgery.  , MD 06/20/2019, 7:09 AM

## 2019-06-20 NOTE — Anesthesia Postprocedure Evaluation (Signed)
Anesthesia Post Note  Patient: Kari Young  Procedure(s) Performed: OPEN REDUCTION INTERNAL FIXATION (ORIF) TRIMALLEOLAR ANKLE FRACTURE (Right Ankle)     Anesthesia Post Evaluation  Last Vitals:  Vitals:   06/20/19 1030 06/20/19 1050  BP: (!) 141/79 122/82  Pulse: 100 94  Resp: 11 18  Temp:  36.8 C  SpO2: 99% 94%    Last Pain:  Vitals:   06/20/19 1050  TempSrc: Oral  PainSc: 3                  Mashell Sieben

## 2019-06-21 ENCOUNTER — Encounter: Payer: Self-pay | Admitting: *Deleted

## 2019-07-13 IMAGING — CT CT HEAD W/O CM
3 of 7 series · 13 of 47 positions shown, 16 images · non-contrast
Comparison: Head CT December 23, 2016

CLINICAL DATA: Altered level of consciousness with questionable
fall

EXAM:
CT HEAD WITHOUT CONTRAST
CT CERVICAL SPINE WITHOUT CONTRAST
TECHNIQUE: Multidetector CT imaging of the head and cervical spine was
performed following the standard protocol without intravenous
contrast. Multiplanar CT image reconstructions of the cervical spine
were also generated.

[Series 6: head 3.0 mpr cor · coronal · 0.31mm/px · 2 of 67 slices shown]
[im 23/67  brain]
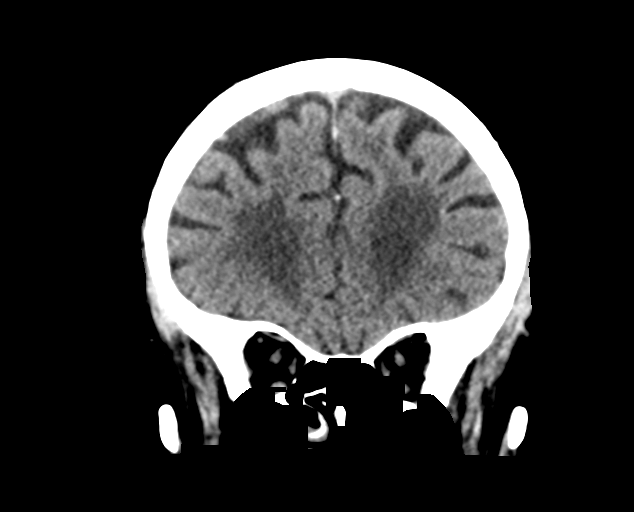
[im 45/67  brain]
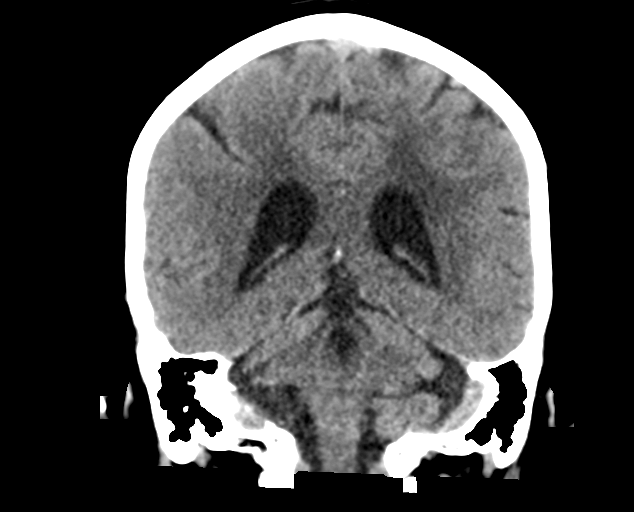

[Series 7: head 3.0 mpr sag · sagittal · 0.36mm/px · 1 of 67 slices shown]
[im 34/67  brain]
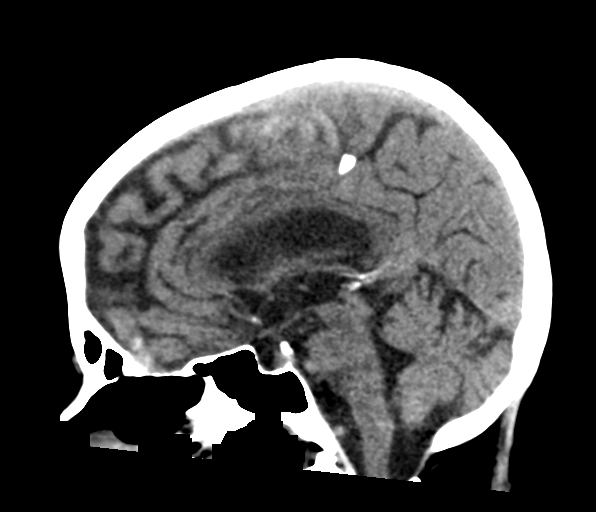

[Series 13: orthogonal axial st · axial · 0.33mm/px · z∈[-279,-166]mm · 10 of 85 slices shown, 13 images]
[im 8/85  brain]
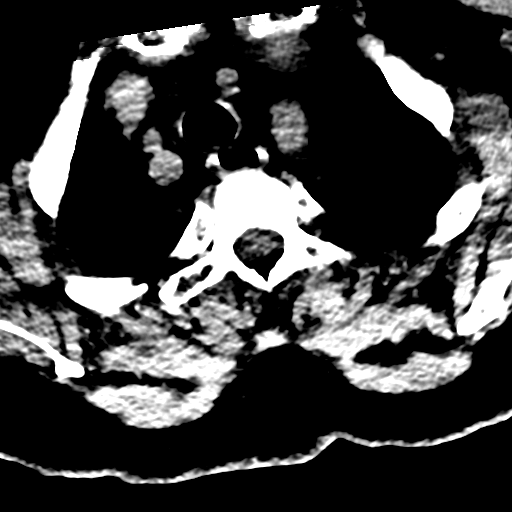
[im 8/85  bone]
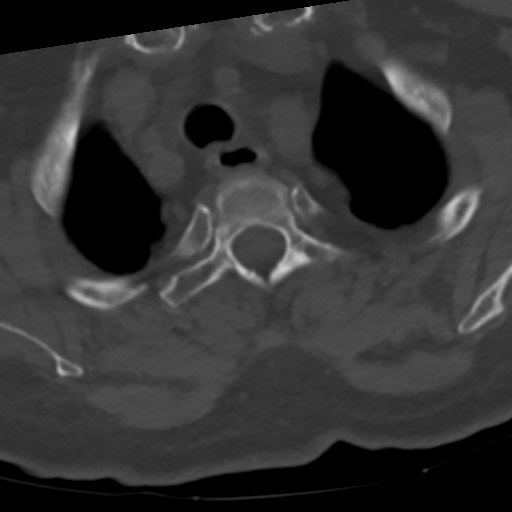
[im 16/85  brain]
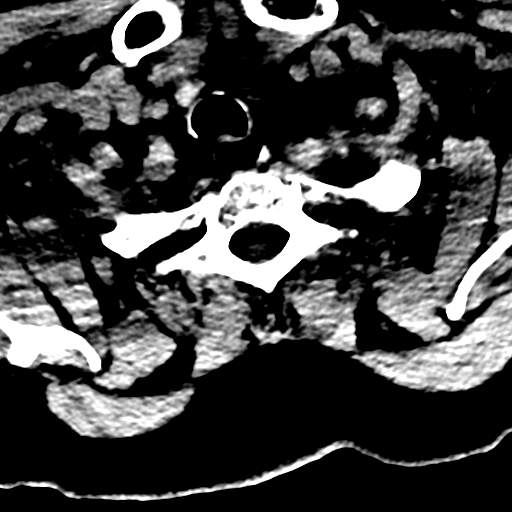
[im 23/85  brain]
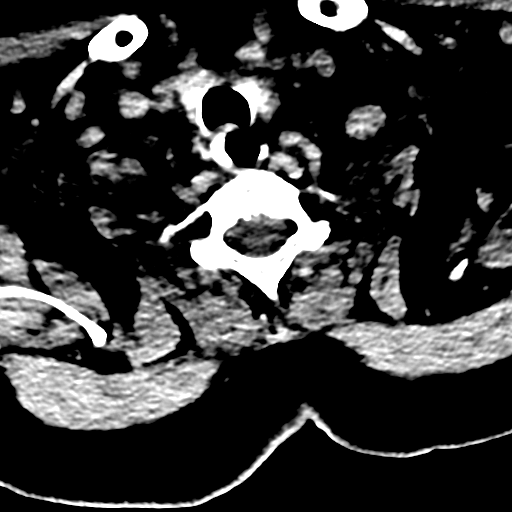
[im 31/85  brain]
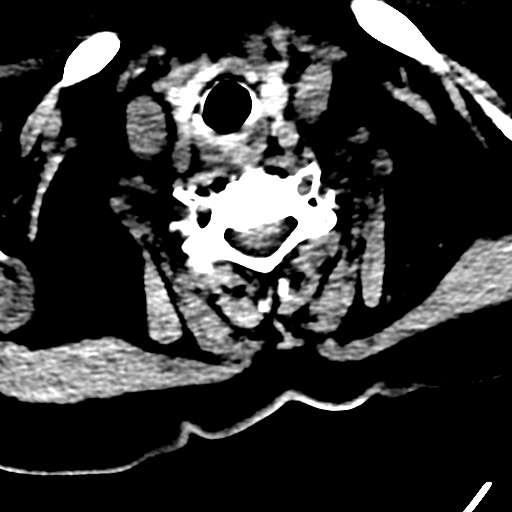
[im 39/85  brain]
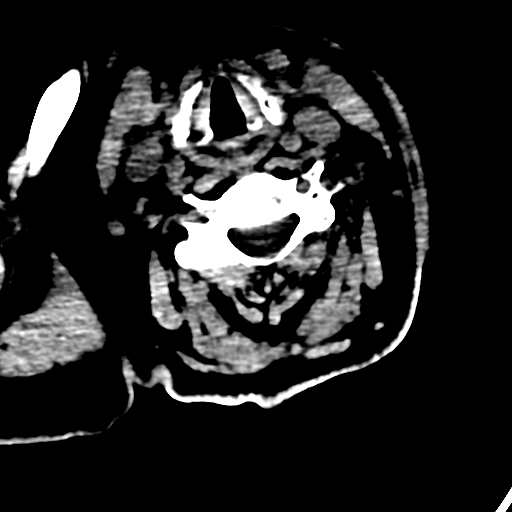
[im 39/85  bone]
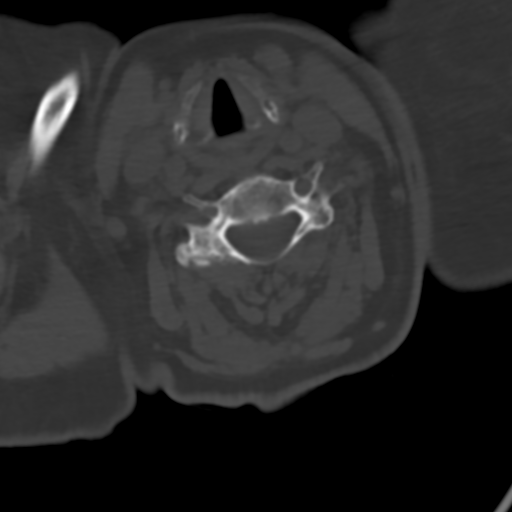
[im 46/85  brain]
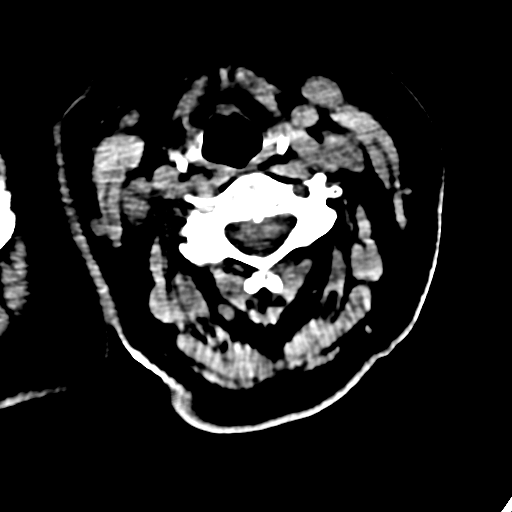
[im 54/85  brain]
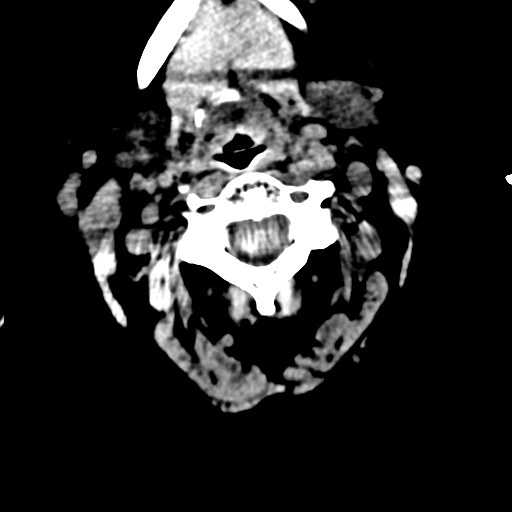
[im 62/85  brain]
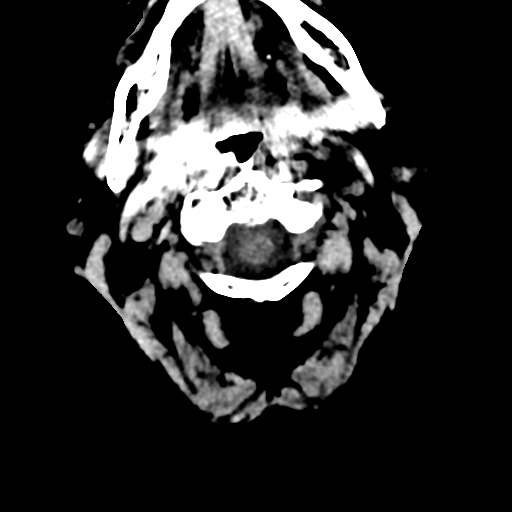
[im 69/85  brain]
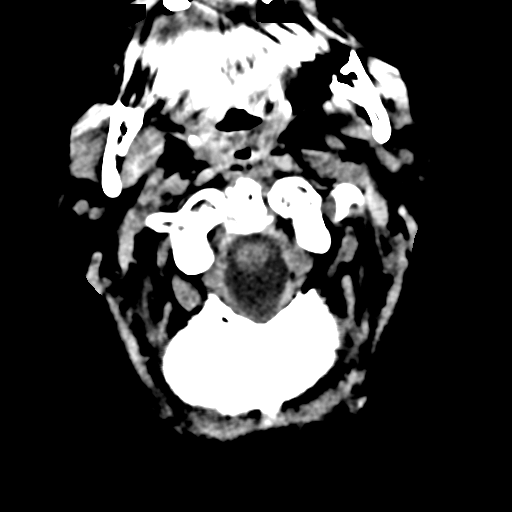
[im 69/85  bone]
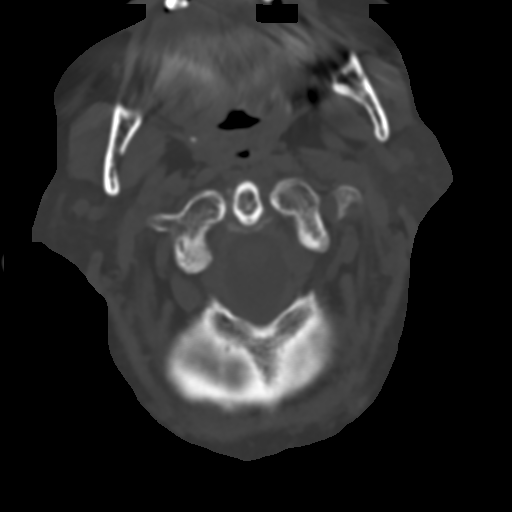
[im 77/85  brain]
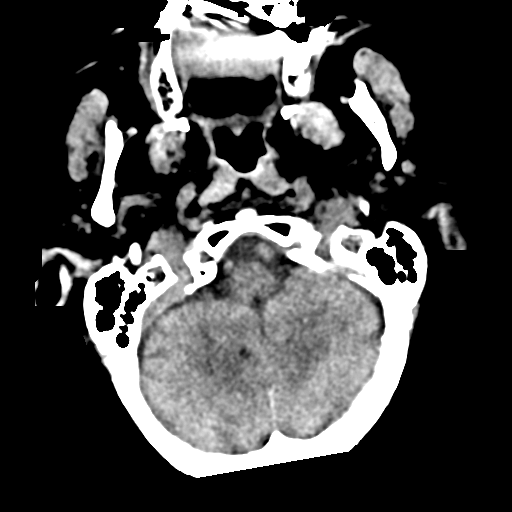

[13 of 47 positions shown; findings below may reference images not displayed]

FINDINGS: CT HEAD FINDINGS

Brain: Relatively mild diffuse atrophy is stable. There is no
intracranial mass, hemorrhage, extra-axial fluid collection, or
midline shift. There is small vessel disease throughout the centra
semiovale bilaterally as well as in the anterior and posterior limbs
of each internal capsule. There is no new gray-white compartment
lesion. No acute infarct is demonstrable on this study.

Vascular: There is no appreciable hyperdense vessel. There is
calcification in each carotid siphon.

Skull: Bony calvarium appears intact.

Sinuses/Orbits: There is mucosal thickening in multiple ethmoid air
cells. Other visualized paranasal sinuses are clear. Orbits appear
symmetric bilaterally except for previous cataract removal on the
right.

Other: Mastoid air cells are clear.

CT CERVICAL SPINE FINDINGS

Alignment: There is 1 mm of anterolisthesis of C3 on C4. There is 1
mm of anterolisthesis of C4 on C5. There is 1 mm of retrolisthesis
of C5 on C6. No other spondylolisthesis evident.

Skull base and vertebrae: Skull base and craniocervical junction
regions appear normal. There is pannus posterior to the odontoid,
not causing appreciable impression on the craniocervical junction
region. There is no appreciable fracture. There are no blastic or
lytic bone lesions.

Soft tissues and spinal canal: The prevertebral soft tissues and
predental space regions are normal. There is no paraspinous lesion.
No cord canal hematoma evident.

Disc levels: There is severe disc space narrowing at C5-6 and C6-7.
There is moderate disc space narrowing at C7-T1. There is slightly
milder disc space narrowing at C4-5. There is multilevel facet
osteoarthritic change. There is exit foraminal narrowing due to bony
hypertrophy at C5-6 and C6-7 bilaterally. No frank disc extrusion or
stenosis.

Upper chest: Visualized upper lung zones are clear.

Other: Mild calcification is noted in the right carotid artery.
IMPRESSION: CT head: Stable atrophy with supratentorial small vessel disease. No
acute infarct. No mass or hemorrhage.

There are foci of arterial vascular calcification. There is mucosal
thickening in several ethmoid air cells.

CT cervical spine: No evident fracture. Slight spondylolisthesis at
several levels is due to underlying spondylosis. There is
spondylosis/osteoarthritic changes several levels. No frank disc
extrusion or stenosis. Slight calcification noted in the right
carotid artery.

## 2019-07-13 IMAGING — CT CT CHEST W/O CM
2 of 3 series · 15 of 36 positions shown, 18 images · non-contrast
Comparison: 08/25/2007 and chest x-ray 09/05/2011

CLINICAL DATA: Possible rib fracture post trauma.

EXAM:
CT CHEST WITHOUT CONTRAST
TECHNIQUE: Multidetector CT imaging of the chest was performed following the
standard protocol without IV contrast.

[Series 4: thorax 2.0 · axial · 0.88mm/px · z∈[-514,-238]mm · 12 of 162 slices shown, 15 images]
[im 12/162  mediastinal]
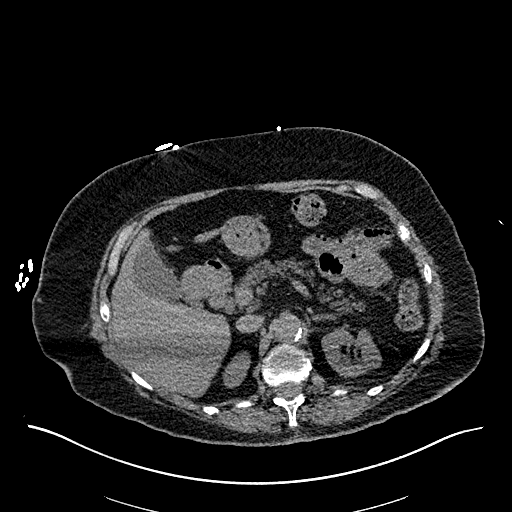
[im 12/162  lung]
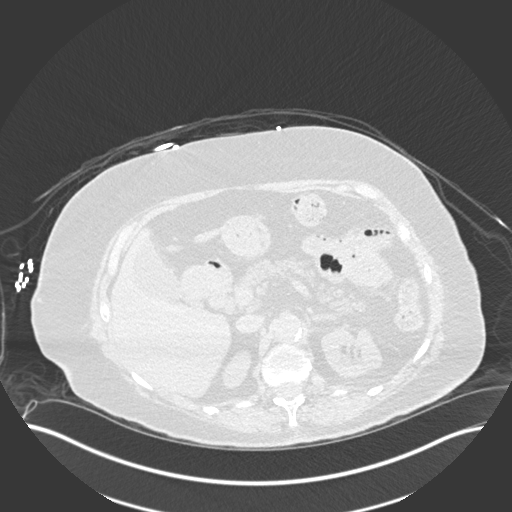
[im 24/162  lung]
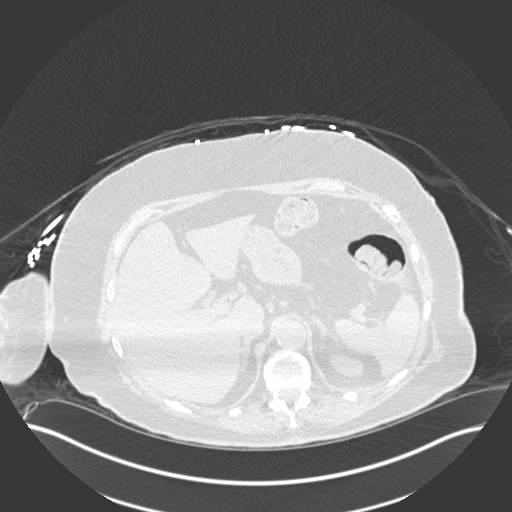
[im 36/162  lung]
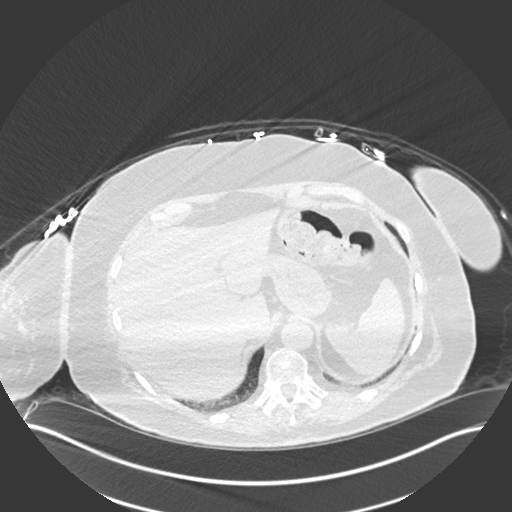
[im 48/162  lung]
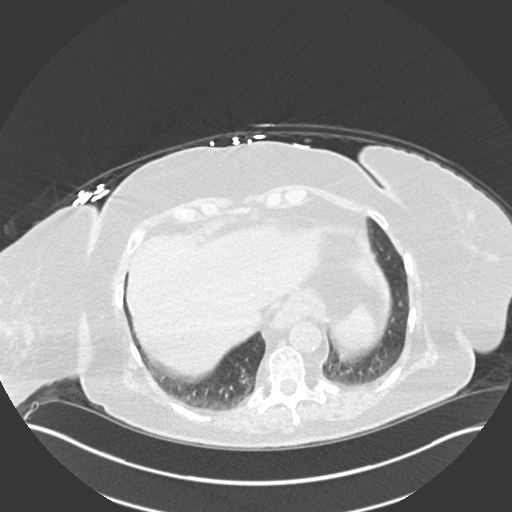
[im 60/162  mediastinal]
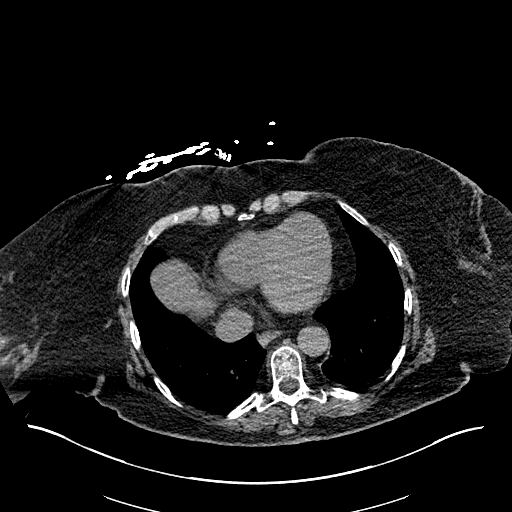
[im 60/162  lung]
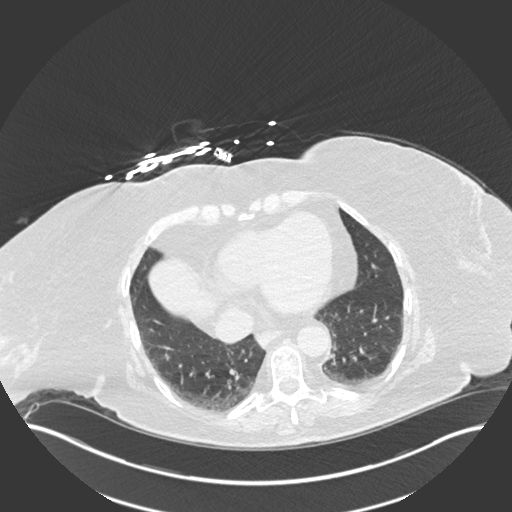
[im 72/162  lung]
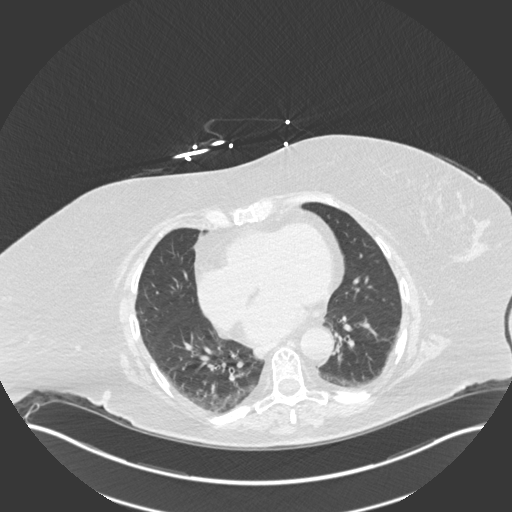
[im 90/162  lung]
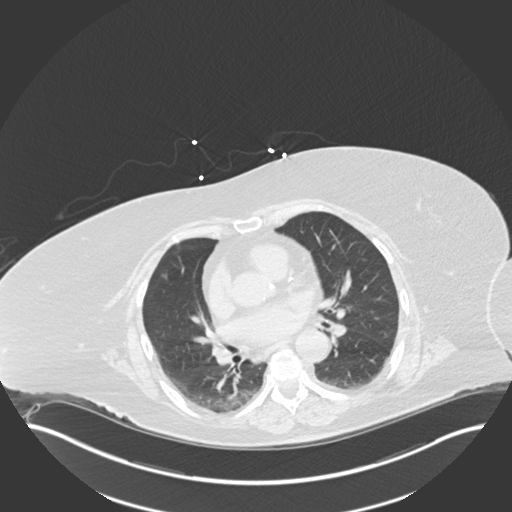
[im 102/162  lung]
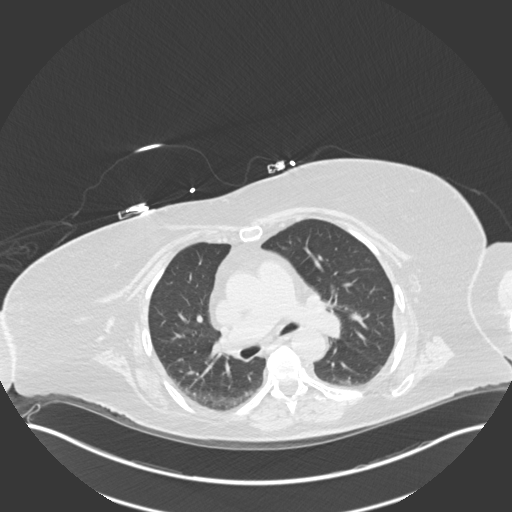
[im 114/162  mediastinal]
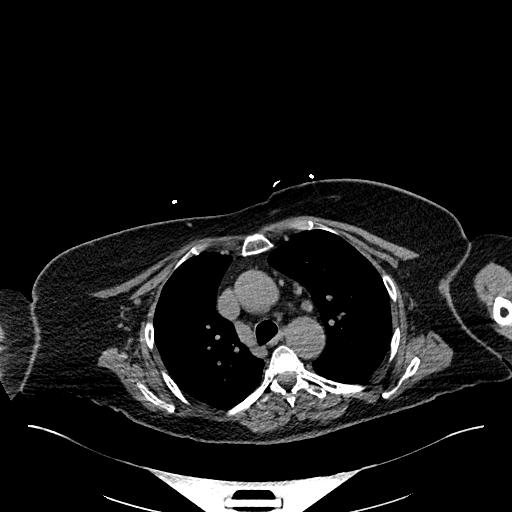
[im 114/162  lung]
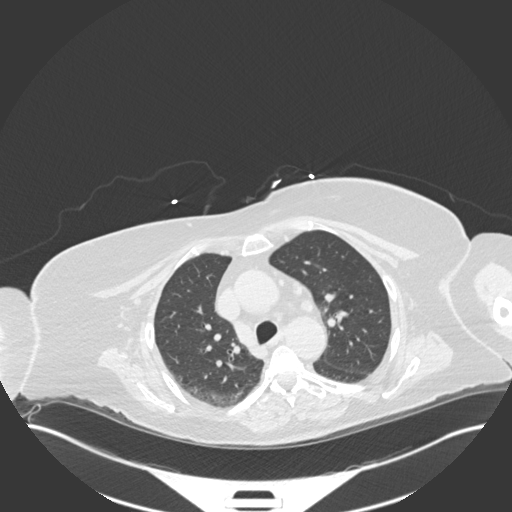
[im 126/162  lung]
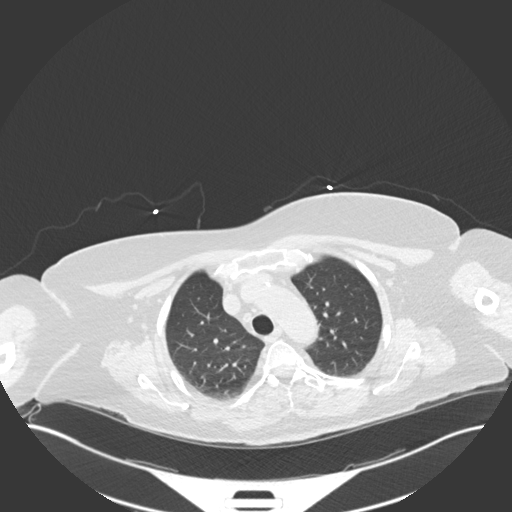
[im 138/162  lung]
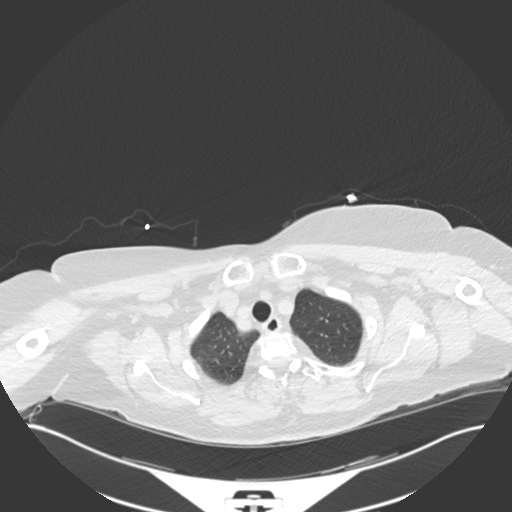
[im 150/162  lung]
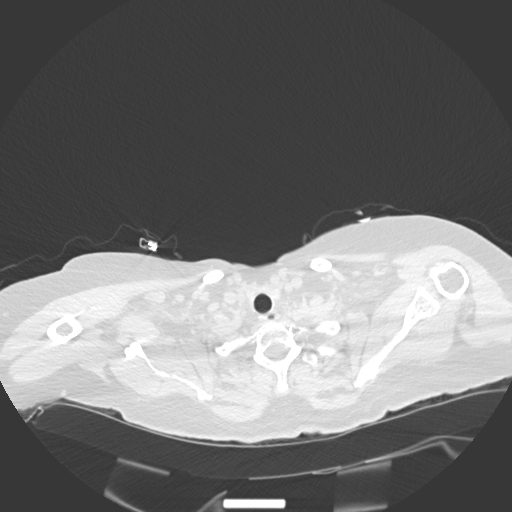

[Series 6: coronal · coronal · 0.65mm/px · 3 of 114 slices shown]
[im 23/114  lung]
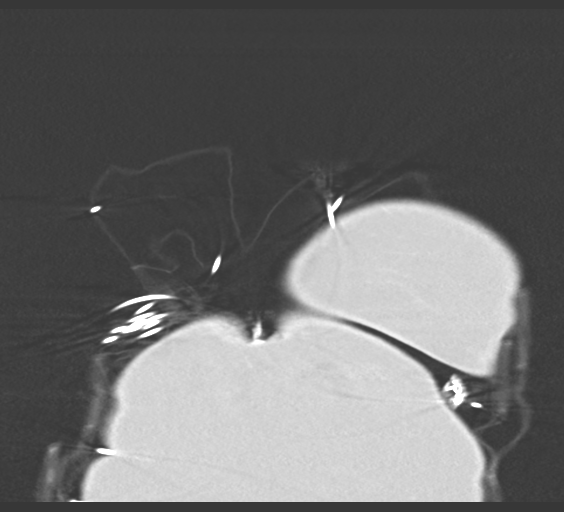
[im 46/114  lung]
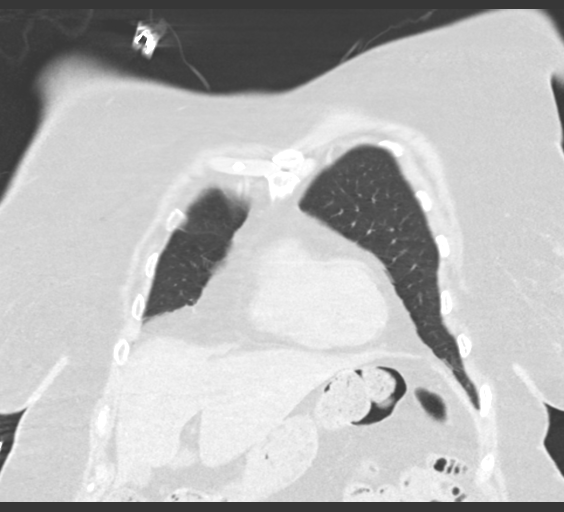
[im 68/114  lung]
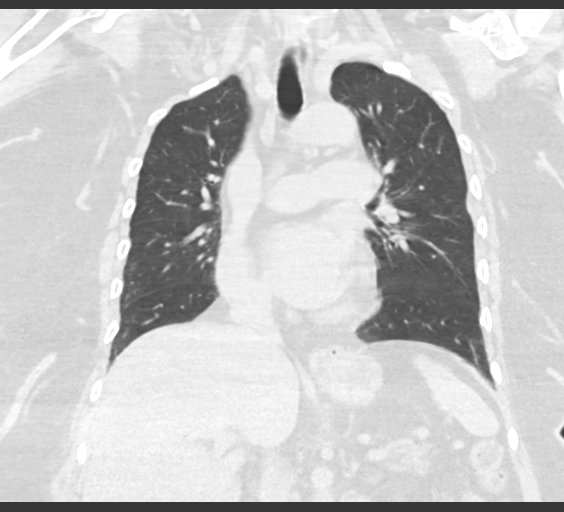

[15 of 36 positions shown; findings below may reference images not displayed]

FINDINGS: Cardiovascular: Borderline cardiomegaly. Calcified plaque over the
left anterior descending coronary artery. Mild calcified plaque over
the thoracic aorta. Remaining vascular structures are within normal.

Mediastinum/Nodes: Several shotty mediastinal lymph nodes are
present there is no mediastinal or hilar adenopathy. Remaining
mediastinal structures are within normal.

Lungs/Pleura: Lungs are well inflated with subtle posterior
dependent atelectasis. No lobar consolidation or effusion. Airways
are normal.

Upper Abdomen: No acute findings. Mild calcified plaque over the
abdominal aorta.

Musculoskeletal: Old fifth posterolateral rib fracture. No acute rib
fractures. Mild degenerate change of the spine. Compression fracture
over the thoracolumbar junction unchanged.
IMPRESSION: No acute rib fracture.

No acute cardiopulmonary disease.

Aortic Atherosclerosis ([id number]). Minimal atherosclerotic
coronary artery disease.

Mild stable compression fracture over the thoracolumbar junction.

## 2019-08-30 ENCOUNTER — Other Ambulatory Visit: Payer: Self-pay

## 2019-08-30 DIAGNOSIS — N3941 Urge incontinence: Secondary | ICD-10-CM

## 2019-08-30 MED ORDER — TOLTERODINE TARTRATE ER 4 MG PO CP24: 4.0000 mg | ORAL_CAPSULE | Freq: Every day | ORAL | 3 refills | Status: DC

## 2020-02-08 ENCOUNTER — Other Ambulatory Visit: Payer: Self-pay | Admitting: Urology

## 2020-02-08 DIAGNOSIS — N3941 Urge incontinence: Secondary | ICD-10-CM

## 2020-04-02 ENCOUNTER — Emergency Department (HOSPITAL_COMMUNITY): Payer: Medicare HMO

## 2020-04-02 ENCOUNTER — Other Ambulatory Visit: Payer: Self-pay

## 2020-04-02 ENCOUNTER — Emergency Department (HOSPITAL_COMMUNITY)
Admission: EM | Admit: 2020-04-02 | Discharge: 2020-04-03 | Disposition: A | Discharge status: Home / Self Care | Payer: Medicare HMO | Attending: Emergency Medicine | Admitting: Emergency Medicine

## 2020-04-02 DIAGNOSIS — I10 Essential (primary) hypertension: Secondary | ICD-10-CM | POA: Insufficient documentation

## 2020-04-02 DIAGNOSIS — R109 Unspecified abdominal pain: Secondary | ICD-10-CM

## 2020-04-02 DIAGNOSIS — R509 Fever, unspecified: Secondary | ICD-10-CM | POA: Diagnosis not present

## 2020-04-02 DIAGNOSIS — R5381 Other malaise: Secondary | ICD-10-CM | POA: Insufficient documentation

## 2020-04-02 DIAGNOSIS — R5383 Other fatigue: Secondary | ICD-10-CM | POA: Diagnosis not present

## 2020-04-02 DIAGNOSIS — Z20822 Contact with and (suspected) exposure to covid-19: Secondary | ICD-10-CM | POA: Diagnosis not present

## 2020-04-02 LAB — BASIC METABOLIC PANEL
Anion gap: 10 (ref 5–15)
BUN: 15 mg/dL (ref 8–23)
CO2: 25 mmol/L (ref 22–32)
Calcium: 9.4 mg/dL (ref 8.9–10.3)
Chloride: 106 mmol/L (ref 98–111)
Creatinine, Ser: 0.8 mg/dL (ref 0.44–1.00)
GFR, Estimated: >60 mL/min (ref >60)
Glucose, Bld: 121 mg/dL — ABNORMAL HIGH (ref 70–99)
Potassium: 4.1 mmol/L (ref 3.5–5.1)
Sodium: 141 mmol/L (ref 135–145)

## 2020-04-02 LAB — CBC
HCT: 46.5 % — ABNORMAL HIGH (ref 36.0–46.0)
Hemoglobin: 15.4 g/dL — ABNORMAL HIGH (ref 12.0–15.0)
MCH: 31.8 pg (ref 26.0–34.0)
MCHC: 33.1 g/dL (ref 30.0–36.0)
MCV: 96.1 fL (ref 80.0–100.0)
Platelets: 151 10*3/uL (ref 150–400)
RBC: 4.84 MIL/uL (ref 3.87–5.11)
RDW: 13.4 % (ref 11.5–15.5)
WBC: 6.6 10*3/uL (ref 4.0–10.5)
nRBC: 0 % (ref 0.0–0.2)

## 2020-04-02 LAB — HEPATIC FUNCTION PANEL
ALT: 23 U/L (ref 0–44)
AST: 24 U/L (ref 15–41)
Albumin: 4.1 g/dL (ref 3.5–5.0)
Alkaline Phosphatase: 73 U/L (ref 38–126)
Bilirubin, Direct: <0.1 mg/dL (ref 0.0–0.2)
Total Bilirubin: 0.7 mg/dL (ref 0.3–1.2)
Total Protein: 7.1 g/dL (ref 6.5–8.1)

## 2020-04-02 LAB — TROPONIN I (HIGH SENSITIVITY)
Troponin I (High Sensitivity): 10 ng/L (ref <18)
Troponin I (High Sensitivity): 10 ng/L (ref <18)

## 2020-04-02 LAB — BRAIN NATRIURETIC PEPTIDE: B Natriuretic Peptide: 44.6 pg/mL (ref 0.0–100.0)

## 2020-04-02 LAB — RESPIRATORY PANEL BY RT PCR (FLU A&B, COVID)
Influenza A by PCR: NEGATIVE
Influenza B by PCR: NEGATIVE
SARS Coronavirus 2 by RT PCR: NEGATIVE

## 2020-04-02 MED ORDER — OXYCODONE HCL 5 MG PO TABS
5.0000 mg | ORAL_TABLET | Freq: Once | ORAL | Status: AC
  Administered 2020-04-02: 5 mg via ORAL
  Filled 2020-04-02: qty 1

## 2020-04-02 NOTE — ED Provider Notes (Signed)
[facility]ER[facility]ENC[facility] DEPT [facility] Emergency Department Provider Note MRN:  [id number]  Arrival date & time: 04/03/20     Chief Complaint   Hypertension History of Present Illness   Kari Young is a 79 y.o. year-old female with no pertinent past medical history presenting to the ED with chief complaint of hypertension.  Patient concerned about her hypertension over the past week.  She also explains that she was diagnosed with a kidney infection but despite antibiotics is not getting better.  Continued right flank pain, malaise, fatigue, intermittent fever.  Denies headache or vision change, no chest pain or shortness of breath, no abdominal pain, no numbness or weakness to the arms or legs.  Review of Systems  A complete 10 system review of systems was obtained and all systems are negative except as noted in the HPI and PMH.   Patient's Health History   No past medical history on file.    No family history on file.  Social History   Socioeconomic History  . Marital status: Widowed    Spouse name: Not on file  . Number of children: Not on file  . Years of education: Not on file  . Highest education level: Not on file  Occupational History  . Not on file  Tobacco Use  . Smoking status: Not on file  Substance and Sexual Activity  . Alcohol use: Not on file  . Drug use: Not on file  . Sexual activity: Not on file  Other Topics Concern  . Not on file  Social History Narrative  . Not on file   Social Determinants of Health   Financial Resource Strain:   . Difficulty of Paying Living Expenses: Not on file  Food Insecurity:   . Worried About Programme researcher, broadcasting/film/video in the Last Year: Not on file  . Ran Out of Food in the Last Year: Not on file  Transportation Needs:   . Lack of Transportation (Medical): Not on file  . Lack of Transportation (Non-Medical): Not on file  Physical Activity:   . Days of Exercise per Week: Not on file  . Minutes of Exercise per Session: Not on  file  Stress:   . Feeling of Stress : Not on file  Social Connections:   . Frequency of Communication with Friends and Family: Not on file  . Frequency of Social Gatherings with Friends and Family: Not on file  . Attends Religious Services: Not on file  . Active Member of Clubs or Organizations: Not on file  . Attends Banker Meetings: Not on file  . Marital Status: Not on file  Intimate Partner Violence:   . Fear of Current or Ex-Partner: Not on file  . Emotionally Abused: Not on file  . Physically Abused: Not on file  . Sexually Abused: Not on file     Physical Exam   Vitals:   04/02/20 2032 04/02/20 2220  BP: (!) 158/90 (!) 153/83  Pulse: 90 76  Resp: 16 16  Temp:    SpO2: 99% 98%    CONSTITUTIONAL: Well-appearing, NAD NEURO:  Alert and oriented x 3, no focal deficits EYES:  eyes equal and reactive ENT/NECK:  no LAD, no JVD CARDIO: Regular rate, well-perfused, normal S1 and S2 PULM:  CTAB no wheezing or rhonchi GI/GU:  normal bowel sounds, non-distended, non-tender MSK/SPINE:  No gross deformities, no edema SKIN:  no rash, atraumatic PSYCH:  Appropriate speech and behavior  *Additional and/or pertinent findings included in MDM below  Diagnostic and Interventional Summary    EKG Interpretation  Date/Time:  Monday April 02 2020 14:38:56 EDT Ventricular Rate:  87 PR Interval:  160 QRS Duration: 88 QT Interval:  372 QTC Calculation: 447 R Axis:   -8 Text Interpretation: Normal sinus rhythm Minimal voltage criteria for LVH, may be normal variant ( R in aVL ) Septal infarct , age undetermined Abnormal ECG No old tracing to compare Confirmed by Marily Memos [phone]) on 04/02/2020 5:16:20 PM      Labs Reviewed  BASIC METABOLIC PANEL - Abnormal; Notable for the following components:      Result Value   Glucose, Bld 121 (*)    All other components within normal limits  CBC - Abnormal; Notable for the following components:   Hemoglobin 15.4 (*)     HCT 46.5 (*)    All other components within normal limits  RESPIRATORY PANEL BY RT PCR (FLU A&B, COVID)  HEPATIC FUNCTION PANEL  BRAIN NATRIURETIC PEPTIDE  URINALYSIS, ROUTINE W REFLEX MICROSCOPIC  TROPONIN I (HIGH SENSITIVITY)  TROPONIN I (HIGH SENSITIVITY)    CT RENAL STONE STUDY  Final Result    DG Chest 2 View  Final Result      Medications  oxyCODONE (Oxy IR/ROXICODONE) immediate release tablet 5 mg (5 mg Oral Given 04/02/20 2256)     Procedures  /  Critical Care Procedures  ED Course and Medical Decision Making  I have reviewed the triage vital signs, the nursing notes, and pertinent available records from the EMR.  Listed above are laboratory and imaging tests that I personally ordered, reviewed, and interpreted and then considered in my medical decision making (see below for details).  Intermittent fevers, flank pain, history of kidney stones, considering pyelonephritis complicated by stone, also considering viral illness such as coronavirus.  Work-up pending.     CT scan is reassuring, as her laboratory tests.  Awaiting urinalysis and Covid test.  Patient continues to have normal vital signs, no fever, well-appearing.  She seems very preoccupied about her blood pressure and she attributes hot flashes at night to her high blood pressure.  She is concerned about blood pressures "greater than 100".  Overall poor understanding of blood pressure and low blood pressures are concerning.  Education attempted, advised PCP follow-up.  We will send urine for culture, given continued dysuria will trial different antibiotic.  Elmer Sow. Pilar Plate, MD [facility] Emergency Medicine [facility] [email or web address]  Final Clinical Impressions(s) / ED Diagnoses     ICD-10-CM   1. Malaise  R53.81   2. Flank pain  R10.9     ED Discharge Orders         Ordered    cephALEXin (KEFLEX) 500 MG capsule  4 times daily        04/03/20 0019           Discharge  Instructions Discussed with and Provided to Patient:     Discharge Instructions     You were evaluated in the Emergency Department and after careful evaluation, we did not find any emergent condition requiring admission or further testing in the hospital.  Your exam/testing today is overall reassuring.  Your symptoms may be due to a continued bladder or kidney infection.  Please take the antibiotics as directed and follow-up with a primary care doctor.  Please return to the Emergency Department if you experience any worsening of your condition.   Thank you for allowing [place] to be a part of your care.  Sabas Sous, MD 04/03/20 Perlie Mayo

## 2020-04-02 NOTE — ED Triage Notes (Signed)
Pt here from home with c/o sob , pt was dx with a kidney infection on Tuesday of this week and had some slight sob then , states that the sob has got worse today

## 2020-04-03 MED ORDER — CEPHALEXIN 500 MG PO CAPS: 500.0000 mg | ORAL_CAPSULE | Freq: Four times a day (QID) | ORAL | 0 refills | Status: AC

## 2020-04-03 NOTE — Discharge Instructions (Addendum)
You were evaluated in the Emergency Department and after careful evaluation, we did not find any emergent condition requiring admission or further testing in the hospital.  Your exam/testing today is overall reassuring.  Your symptoms may be due to a continued bladder or kidney infection.  Please take the antibiotics as directed and follow-up with a primary care doctor.  Please return to the Emergency Department if you experience any worsening of your condition.   Thank you for allowing [place] to be a part of your care.

## 2020-04-27 ENCOUNTER — Other Ambulatory Visit (HOSPITAL_COMMUNITY): Payer: Self-pay | Admitting: Family

## 2020-04-27 ENCOUNTER — Other Ambulatory Visit: Payer: Self-pay | Admitting: Family

## 2020-04-27 DIAGNOSIS — R1011 Right upper quadrant pain: Secondary | ICD-10-CM

## 2020-05-09 ENCOUNTER — Encounter (HOSPITAL_COMMUNITY): Payer: Self-pay

## 2020-05-09 ENCOUNTER — Ambulatory Visit (HOSPITAL_COMMUNITY): Payer: Medicare HMO

## 2022-05-01 ENCOUNTER — Other Ambulatory Visit: Payer: Self-pay

## 2022-05-01 ENCOUNTER — Encounter (HOSPITAL_COMMUNITY): Payer: Self-pay

## 2022-05-01 ENCOUNTER — Observation Stay (HOSPITAL_COMMUNITY)
Admission: EM | Admit: 2022-05-01 | Discharge: 2022-05-03 | Disposition: A | Discharge status: Home / Self Care | Payer: Medicare HMO | Attending: Internal Medicine | Admitting: Internal Medicine

## 2022-05-01 ENCOUNTER — Emergency Department (HOSPITAL_COMMUNITY): Payer: Medicare HMO

## 2022-05-01 DIAGNOSIS — R2689 Other abnormalities of gait and mobility: Secondary | ICD-10-CM | POA: Diagnosis not present

## 2022-05-01 DIAGNOSIS — I5032 Chronic diastolic (congestive) heart failure: Secondary | ICD-10-CM | POA: Diagnosis present

## 2022-05-01 DIAGNOSIS — I110 Hypertensive heart disease with heart failure: Secondary | ICD-10-CM | POA: Diagnosis not present

## 2022-05-01 DIAGNOSIS — F0390 Unspecified dementia without behavioral disturbance: Secondary | ICD-10-CM | POA: Diagnosis not present

## 2022-05-01 DIAGNOSIS — R5381 Other malaise: Secondary | ICD-10-CM

## 2022-05-01 DIAGNOSIS — Z79899 Other long term (current) drug therapy: Secondary | ICD-10-CM | POA: Insufficient documentation

## 2022-05-01 DIAGNOSIS — E860 Dehydration: Secondary | ICD-10-CM | POA: Diagnosis not present

## 2022-05-01 DIAGNOSIS — I10 Essential (primary) hypertension: Secondary | ICD-10-CM | POA: Diagnosis not present

## 2022-05-01 DIAGNOSIS — M6281 Muscle weakness (generalized): Secondary | ICD-10-CM | POA: Diagnosis not present

## 2022-05-01 DIAGNOSIS — R4182 Altered mental status, unspecified: Secondary | ICD-10-CM | POA: Diagnosis not present

## 2022-05-01 LAB — CBC WITH DIFFERENTIAL/PLATELET
Abs Immature Granulocytes: 0.01 10*3/uL (ref 0.00–0.07)
Basophils Absolute: 0.1 10*3/uL (ref 0.0–0.1)
Basophils Relative: 1 %
Eosinophils Absolute: 0.1 10*3/uL (ref 0.0–0.5)
Eosinophils Relative: 1 %
HCT: 43.1 % (ref 36.0–46.0)
Hemoglobin: 14.3 g/dL (ref 12.0–15.0)
Immature Granulocytes: 0 %
Lymphocytes Relative: 32 %
Lymphs Abs: 2.1 10*3/uL (ref 0.7–4.0)
MCH: 31.4 pg (ref 26.0–34.0)
MCHC: 33.2 g/dL (ref 30.0–36.0)
MCV: 94.7 fL (ref 80.0–100.0)
Monocytes Absolute: 0.4 10*3/uL (ref 0.1–1.0)
Monocytes Relative: 6 %
Neutro Abs: 4 10*3/uL (ref 1.7–7.7)
Neutrophils Relative %: 60 %
Platelets: 151 10*3/uL (ref 150–400)
RBC: 4.55 MIL/uL (ref 3.87–5.11)
RDW: 13.3 % (ref 11.5–15.5)
WBC: 6.7 10*3/uL (ref 4.0–10.5)
nRBC: 0 % (ref 0.0–0.2)

## 2022-05-01 LAB — COMPREHENSIVE METABOLIC PANEL
ALT: 14 U/L (ref 0–44)
AST: 17 U/L (ref 15–41)
Albumin: 4.1 g/dL (ref 3.5–5.0)
Alkaline Phosphatase: 87 U/L (ref 38–126)
Anion gap: 6 (ref 5–15)
BUN: 9 mg/dL (ref 8–23)
CO2: 27 mmol/L (ref 22–32)
Calcium: 8.7 mg/dL — ABNORMAL LOW (ref 8.9–10.3)
Chloride: 108 mmol/L (ref 98–111)
Creatinine, Ser: 0.68 mg/dL (ref 0.44–1.00)
GFR, Estimated: >60 mL/min (ref >60)
Glucose, Bld: 109 mg/dL — ABNORMAL HIGH (ref 70–99)
Potassium: 3.9 mmol/L (ref 3.5–5.1)
Sodium: 141 mmol/L (ref 135–145)
Total Bilirubin: 0.5 mg/dL (ref 0.3–1.2)
Total Protein: 7.4 g/dL (ref 6.5–8.1)

## 2022-05-01 LAB — CBG MONITORING, ED: Glucose-Capillary: 107 mg/dL — ABNORMAL HIGH (ref 70–99)

## 2022-05-01 LAB — RAPID URINE DRUG SCREEN, HOSP PERFORMED
Amphetamines: NOT DETECTED
Barbiturates: NOT DETECTED
Benzodiazepines: NOT DETECTED
Cocaine: NOT DETECTED
Opiates: NOT DETECTED
Tetrahydrocannabinol: NOT DETECTED

## 2022-05-01 LAB — URINALYSIS, ROUTINE W REFLEX MICROSCOPIC
Bilirubin Urine: NEGATIVE
Glucose, UA: NEGATIVE mg/dL
Hgb urine dipstick: NEGATIVE
Ketones, ur: NEGATIVE mg/dL
Leukocytes,Ua: NEGATIVE
Nitrite: NEGATIVE
Protein, ur: NEGATIVE mg/dL
Specific Gravity, Urine: 1.006 (ref 1.005–1.030)
pH: 7 (ref 5.0–8.0)

## 2022-05-01 LAB — FOLATE: Folate: 22 ng/mL (ref >5.9)

## 2022-05-01 LAB — VITAMIN B12: Vitamin B-12: 225 pg/mL (ref 180–914)

## 2022-05-01 MED ORDER — PANTOPRAZOLE SODIUM 20 MG PO TBEC
20.0000 mg | DELAYED_RELEASE_TABLET | Freq: Every day | ORAL | Status: DC
  Filled 2022-05-01 (×5): qty 1

## 2022-05-01 MED ORDER — ACETAMINOPHEN 650 MG RE SUPP: 650.0000 mg | Freq: Four times a day (QID) | RECTAL | Status: DC | PRN

## 2022-05-01 MED ORDER — ENOXAPARIN SODIUM 40 MG/0.4ML IJ SOSY: 40.0000 mg | PREFILLED_SYRINGE | INTRAMUSCULAR | Status: DC

## 2022-05-01 MED ORDER — ACETAMINOPHEN 325 MG PO TABS: 650.0000 mg | ORAL_TABLET | Freq: Four times a day (QID) | ORAL | Status: DC | PRN

## 2022-05-01 MED ORDER — SODIUM CHLORIDE 0.9 % IV SOLN: INTRAVENOUS | Status: DC

## 2022-05-01 MED ORDER — AMLODIPINE BESYLATE 5 MG PO TABS
5.0000 mg | ORAL_TABLET | Freq: Every day | ORAL | Status: DC
  Administered 2022-05-01 – 2022-05-03 (×3): 5 mg via ORAL
  Filled 2022-05-01 (×3): qty 1

## 2022-05-01 MED ORDER — MAGNESIUM OXIDE -MG SUPPLEMENT 400 (240 MG) MG PO TABS
400.0000 mg | ORAL_TABLET | Freq: Every day | ORAL | Status: DC
  Administered 2022-05-02 – 2022-05-03 (×2): 400 mg via ORAL
  Filled 2022-05-01 (×7): qty 1

## 2022-05-01 NOTE — H&P (Signed)
[facility] H&P   Patient Demographics:    Kari Young, is a 81 y.o. female  MRN: [id number]   DOB - 28-Mar-1941  Admit Date - 05/01/2022  Outpatient Primary MD for the patient is [facility]  Referring MD/NP/PA: Dr Posey Rea    Patient coming from: home  Chief Complaint  Patient presents with   Altered Mental Status      HPI:    Kari Young  is a 81 y.o. female, with past medical history of CHF, hypertension, anxiety, recent diagnosis with UTI, treated with Augmentin, patient brought to ED by her husband for altered mental status, was obtained from the husband by phone([phone]), husband report patient went to sleep on the couch, then rolled off the couch onto the ground, when he woke her up on the ground she was excessively sleepy, difficult to arouse, she was excessively confused, so he called EMS, and ED upon initial evaluation she was not oriented to time and place, currently she is oriented to place but not time, husband denies any use of alcohol or illicit drug use, husband reported patient In Charge of filling her pillbox from her medicine, upon reviewing her meds it does appear she is having multiple bottles of the same meds, some few months old, upon reviewing her pillbox as well it does appear to have different pills each different day. -In ED CT head with no acute findings, no significant labs abnormalities, patient had no focal deficits, [facility] hospitalist consulted to admit    Review of systems:    Review of system is unreliable from patient due to altered mental status   With Past History of the following :    Past Medical History:  Diagnosis Date   Anxiety    CHF (congestive heart failure) (HCC)    Closed right ankle fracture    Depression    GERD (gastroesophageal reflux disease)    Hypertension    MRSA infection    OAB  (overactive bladder)       Past Surgical History:  Procedure Laterality Date   ABDOMINAL HYSTERECTOMY     APPENDECTOMY     NASAL SINUS SURGERY     ORIF ANKLE FRACTURE Right 06/20/2019   Procedure: OPEN REDUCTION INTERNAL FIXATION (ORIF) TRIMALLEOLAR ANKLE FRACTURE;  Surgeon: Terance Hart, MD;  Location: [facility];  Service: Orthopedics;  Laterality: Right;  block in preop      Social History:     Social History   Tobacco Use   Smoking status: Never   Smokeless tobacco: Never  Substance Use Topics   Alcohol use: No        Family History :     Family History  Problem Relation Age of Onset   Sudden Cardiac Death Neg Hx      Home Medications:   Prior to Admission medications   Medication Sig  Start Date End Date Taking? Authorizing Provider  amLODipine (NORVASC) 5 MG tablet Take 5 mg by mouth daily.    [provider]  ARIPiprazole (ABILIFY) 5 MG tablet Take 5 mg by mouth daily. 05/23/19   [provider]  Ascorbic Acid (VITAMIN C PO) Take 1 tablet by mouth daily.    [provider]  Cholecalciferol 1000 UNITS tablet Take 1,000 Units by mouth daily.    [provider]  conjugated estrogens (PREMARIN) vaginal cream Place 1 Applicatorful vaginally every Monday, Wednesday, and Friday.    [provider]  Cyanocobalamin (VITAMIN B-12 PO) Take 1 tablet by mouth daily.    [provider]  fexofenadine (ALLEGRA) 180 MG tablet Take 180 mg by mouth daily.    [provider]  furosemide (LASIX) 20 MG tablet Take 20 mg by mouth.    [provider]  hydrocortisone (ANUSOL-HC) 2.5 % rectal cream Place 1 application rectally every 6 (six) hours as needed for hemorrhoids or anal itching.    [provider]  magnesium oxide (MAG-OX) 400 MG tablet Take 400 mg by mouth daily.     [provider]  mirabegron ER (MYRBETRIQ) 50 MG TB24 tablet Take 50 mg by mouth daily.    [provider]  ondansetron (ZOFRAN ODT) 4 MG disintegrating tablet Take 1 tablet (4 mg total) by mouth every 8 (eight) hours as needed for nausea. 06/05/19   Eber Hong, MD  pantoprazole (PROTONIX) 20 MG tablet Take 20 mg by mouth daily. 05/23/19   [provider]  polyethylene glycol (MIRALAX / GLYCOLAX) packet Take 17 g by mouth 2 (two) times daily. 01/26/18   Regalado, [name], MD  potassium chloride (KLOR-CON) 10 MEQ tablet Take 1 tablet by mouth 2 (two) times daily. 05/23/19   [provider]  Probiotic Product (RISA-BID PROBIOTIC) TABS Take 1 tablet by mouth twice a day.    [provider]  senna-docusate (SENOKOT-S) 8.6-50 MG tablet Take 1 tablet by mouth daily. 01/27/18   Regalado, [name], MD  tolterodine (DETROL LA) 4 MG 24 hr capsule TAKE 1 CAPSULE BY MOUTH ONCE A DAY. 02/09/20   McKenzie, Mardene Celeste, MD  venlafaxine (EFFEXOR-XR) 150 MG 24 hr capsule Take 150 mg by mouth daily.    [provider]     Allergies:     Allergies  Allergen Reactions   Percocet [Oxycodone-Acetaminophen] Itching   Sulfamethoxazole Nausea Only     Physical Exam:   Vitals  Blood pressure (!) 151/70, pulse 79, temperature 98.7 F (37.1 C), resp. rate (!) 22, height 5\' 3"  (1.6 m), weight 80 kg, SpO2 100 %.   1. General well-developed female, laying in bed, no apparent distress  2.  Impaired judgment and insight, confused, she is oriented x 2   3. No F.N deficits, ALL C.Nerves Intact, Strength 5/5 all 4 extremities, Sensation intact all 4 extremities, Plantars down going.  4. Ears and Eyes appear Normal, Conjunctivae clear, PERRLA. Moist Oral Mucosa.  5. Supple Neck, No JVD, No cervical lymphadenopathy appriciated, No Carotid Bruits.  6. Symmetrical Chest wall movement, Good air movement bilaterally, CTAB.  7. RRR, No Gallops, Rubs or Murmurs, No Parasternal Heave.  8. Positive Bowel Sounds, Abdomen Soft, No tenderness, No organomegaly appriciated,No  rebound -guarding or rigidity.  9.  No Cyanosis, Normal Skin Turgor, No Skin Rash or Bruise.  10. Good muscle tone,  joints appear normal , no effusions, Normal ROM.   Data Review:  CBC Recent Labs  Lab 05/01/22 1839  WBC 6.7  HGB 14.3  HCT 43.1  PLT 151  MCV 94.7  MCH 31.4  MCHC 33.2  RDW 13.3  LYMPHSABS 2.1  MONOABS 0.4  EOSABS 0.1  BASOSABS 0.1   ------------------------------------------------------------------------------------------------------------------  Chemistries  Recent Labs  Lab 05/01/22 1839  NA 141  K 3.9  CL 108  CO2 27  GLUCOSE 109*  BUN 9  CREATININE 0.68  CALCIUM 8.7*  AST 17  ALT 14  ALKPHOS 87  BILITOT 0.5   ------------------------------------------------------------------------------------------------------------------ estimated creatinine clearance is 55.2 mL/min (by C-G formula based on SCr of 0.68 mg/dL). ------------------------------------------------------------------------------------------------------------------ No results for input(s): "TSH", "T4TOTAL", "T3FREE", "THYROIDAB" in the last 72 hours.  Invalid input(s): "FREET3"  Coagulation profile No results for input(s): "INR", "PROTIME" in the last 168 hours. ------------------------------------------------------------------------------------------------------------------- No results for input(s): "DDIMER" in the last 72 hours. -------------------------------------------------------------------------------------------------------------------  Cardiac Enzymes No results for input(s): "CKMB", "TROPONINI", "MYOGLOBIN" in the last 168 hours.  Invalid input(s): "CK" ------------------------------------------------------------------------------------------------------------------    Component Value Date/Time   BNP 44.6 04/02/2020 1859     ---------------------------------------------------------------------------------------------------------------  Urinalysis     Component Value Date/Time   COLORURINE STRAW (A) 05/01/2022 1840   APPEARANCEUR CLEAR 05/01/2022 1840   LABSPEC 1.006 05/01/2022 1840   PHURINE 7.0 05/01/2022 1840   GLUCOSEU NEGATIVE 05/01/2022 1840   HGBUR NEGATIVE 05/01/2022 1840   BILIRUBINUR NEGATIVE 05/01/2022 1840   KETONESUR NEGATIVE 05/01/2022 1840   PROTEINUR NEGATIVE 05/01/2022 1840   UROBILINOGEN 0.2 08/25/2007 1851   NITRITE NEGATIVE 05/01/2022 1840   LEUKOCYTESUR NEGATIVE 05/01/2022 1840    ----------------------------------------------------------------------------------------------------------------   Imaging Results:    CT Head Wo Contrast  Result Date: 05/01/2022 CLINICAL DATA:  Delirium. EXAM: CT HEAD WITHOUT CONTRAST TECHNIQUE: Contiguous axial images were obtained from the base of the skull through the vertex without intravenous contrast. RADIATION DOSE REDUCTION: This exam was performed according to the departmental dose-optimization program which includes automated exposure control, adjustment of the mA and/or kV according to patient size and/or use of iterative reconstruction technique. COMPARISON:  Head CT dated 01/23/2018. FINDINGS: Brain: Moderate age-related atrophy and chronic microvascular ischemic changes. There is no acute intracranial hemorrhage. No mass effect or midline shift. No extra-axial fluid collection. Vascular: No hyperdense vessel or unexpected calcification. Skull: Normal. Negative for fracture or focal lesion. Sinuses/Orbits: No acute finding. Other: None IMPRESSION: 1. No acute intracranial pathology. 2. Moderate age-related atrophy and chronic microvascular ischemic changes. Electronically Signed   By: Elgie Collard M.D.   On: 05/01/2022 19:28       Assessment & Plan:    Principal Problem:   AMS (altered mental status) Active Problems:   Hypertension   Chronic diastolic CHF (congestive heart failure) (HCC)    AMS -Patient presents with confusion, falls, generalized weakness, no  focal deficits -CT head with no acute finding -Drug screen is negative -No findings of infection. -This is likely multifactorial, this is most likely due to polypharmacy , and the patient with outpatient workup for possible dementia, who is still in charge of arranging her own medication and pillbox (by reviewing her medications, it does appear she has double bottles of some medications including BuSpar, antiprotozoal, Ambien and benzatropine, as well she is on venlafaxine and escitalopram, patient is not clear what meds she is supposed to be taking, she is feeling pillbox on her own, her pillbox appears to be having different tablets for each day of the week !!) -As well she it does appear she has  been having progressive dementia, was seen by her PCP last September where she had 17 out of 30 in the Mini-Mental exam, with pretty presumed diagnosis of dementia, as well she had recent visit with PCP for hallucinations. -She will be admitted overnight for observation, will hold all her psych/sedating meds, will consult PT/OT, will keep on gentle hydration, and likely she can be discharged home tomorrow with recommendation to follow with her PCP and likely outpatient referral to neurology. -CT head with no acute finding -Will check B12 and folic acid, recent TSH was normal no need to repeat  Hypertension -Continue with home medication  Chronic diastolic CHF - Appears to be euvolemic    DVT Prophylaxis   Lovenox   AM Labs Ordered, also please review Full Orders  Family Communication: Admission, patients condition and plan of care including tests being ordered have been discussed with the patient and husband [phone]( [phone]) who indicate understanding and agree with the plan and Code Status.  Code Status full  Likely DC to  home  Condition GUARDED    Consults called: none    Admission status: observation     Time spent in minutes : 65 minutes   Huey Bienenstockawood Amauria Younts M.D on 05/01/2022 at 8:38  PM   [facility] - Office  [phone]

## 2022-05-01 NOTE — ED Triage Notes (Addendum)
Pt presents with altered mental status after going to bed and falling off the cough. Family tried to wake pt up and could not. Pt is alert and sitting up at this time.   22 in L hand

## 2022-05-01 NOTE — ED Provider Notes (Signed)
[facility] MEDICAL SURGICAL UNIT Provider Note  CSN: [id number] Arrival date & time: 05/01/22 1818  Chief Complaint(s) Altered Mental Status  HPI Kari Young is a 81 y.o. female with PMH anxiety, CHF, HTN, currently being treated for a UTI on Augmentin who presents emergency department for evaluation of altered mental status.  History obtained from patient's husband who states that the patient went to sleep on the couch and then rolled off the couch onto the ground.  When they woke her up on the ground, she was excessively sleepy and difficult to arouse.  Husband states that she has been excessively confused since then and here in the emergency department patient is alert but not oriented to time or place.  Denies alcohol use or illicit substance use.  Ambien is listed in her med list but husband states that she has not taken this medication for many years   Past Medical History Past Medical History:  Diagnosis Date   Anxiety    CHF (congestive heart failure) (HCC)    Closed right ankle fracture    Depression    GERD (gastroesophageal reflux disease)    Hypertension    MRSA infection    OAB (overactive bladder)    Patient Active Problem List   Diagnosis Date Noted   AMS (altered mental status) 05/01/2022   Near syncope 01/25/2018   Bradycardia    Chest tightness    Hypertension 01/23/2018   Sinus bradycardia by electrocardiogram 01/23/2018   Postural dizziness with near syncope 01/23/2018   Frequent falls 01/23/2018   Thoracic compression fracture, closed, initial encounter (HCC) 01/23/2018   Chronic diastolic CHF (congestive heart failure) (HCC) 01/23/2018   COLLES' FRACTURE, LEFT 11/12/2009   Home Medication(s) Prior to Admission medications   Medication Sig Start Date End Date Taking? Authorizing Provider  amLODipine (NORVASC) 5 MG tablet Take 5 mg by mouth daily.   Yes [provider]  ARIPiprazole (ABILIFY) 5 MG tablet Take 5 mg by mouth daily.  05/23/19  Yes [provider]  Ascorbic Acid (VITAMIN C PO) Take 1 tablet by mouth daily.   Yes [provider]  benztropine (COGENTIN) 1 MG tablet Take by mouth daily. 04/15/22  Yes [provider]  busPIRone (BUSPAR) 10 MG tablet Take 10 mg by mouth 2 (two) times daily.   Yes [provider]  Cholecalciferol 1000 UNITS tablet Take 1,000 Units by mouth daily.   Yes [provider]  Cyanocobalamin (VITAMIN B-12 PO) Take 1 tablet by mouth daily.   Yes [provider]  furosemide (LASIX) 20 MG tablet Take 20 mg by mouth.   Yes [provider]  levocetirizine (XYZAL) 5 MG tablet Take 1 tablet by mouth every evening. 04/09/21  Yes [provider]  magnesium oxide (MAG-OX) 400 MG tablet Take 400 mg by mouth daily.    Yes [provider]  nitrofurantoin, macrocrystal-monohydrate, (MACROBID) 100 MG capsule Take 100 mg by mouth 2 (two) times daily. 04/24/22  Yes [provider]  pantoprazole (PROTONIX) 40 MG tablet Take 40 mg by mouth daily. 05/23/19  Yes [provider]  Probiotic Product (RISA-BID PROBIOTIC) TABS Take 1 tablet by mouth twice a day.   Yes [provider]  senna-docusate (SENOKOT-S) 8.6-50 MG tablet Take 1 tablet by mouth daily. 01/27/18  Yes Regalado, [name], MD  venlafaxine (EFFEXOR-XR) 150 MG 24 hr capsule Take 150 mg by mouth daily.   Yes [provider]  zolpidem (AMBIEN) 10 MG tablet Take 10  mg by mouth at bedtime as needed for sleep.   Yes [provider]  conjugated estrogens (PREMARIN) vaginal cream Place 1 Applicatorful vaginally every Monday, Wednesday, and Friday. Patient not taking: Reported on 05/02/2022    [provider]  ondansetron (ZOFRAN ODT) 4 MG disintegrating tablet Take 1 tablet (4 mg total) by mouth every 8 (eight) hours as needed for nausea. Patient not taking: Reported on 05/02/2022 06/05/19   Eber Hong, MD  polyethylene  glycol [facility] Ethelene Hal) packet Take 17 g by mouth 2 (two) times daily. 01/26/18   Regalado, [name], MD  potassium chloride (KLOR-CON) 10 MEQ tablet Take 1 tablet by mouth 2 (two) times daily. Patient not taking: Reported on 05/02/2022 05/23/19   [provider]  tolterodine (DETROL LA) 4 MG 24 hr capsule TAKE 1 CAPSULE BY MOUTH ONCE A DAY. Patient not taking: Reported on 05/02/2022 02/09/20   Malen Gauze, MD                                                                                                                                    Past Surgical History Past Surgical History:  Procedure Laterality Date   ABDOMINAL HYSTERECTOMY     APPENDECTOMY     NASAL SINUS SURGERY     ORIF ANKLE FRACTURE Right 06/20/2019   Procedure: OPEN REDUCTION INTERNAL FIXATION (ORIF) TRIMALLEOLAR ANKLE FRACTURE;  Surgeon: Terance Hart, MD;  Location: [facility];  Service: Orthopedics;  Laterality: Right;  block in preop   Family History Family History  Problem Relation Age of Onset   Sudden Cardiac Death Neg Hx     Social History Social History   Tobacco Use   Smoking status: Never   Smokeless tobacco: Never  Vaping Use   Vaping Use: Never used  Substance Use Topics   Alcohol use: No   Drug use: Never   Allergies Percocet [oxycodone-acetaminophen] and Sulfamethoxazole  Review of Systems Review of Systems  Psychiatric/Behavioral:  Positive for confusion.     Physical Exam Vital Signs  I have reviewed the triage vital signs BP (!) 183/78 (BP Location: Right Arm)   Pulse 71   Temp (!) 97.5 F (36.4 C) (Oral)   Resp 20   Ht 5\' 3"  (1.6 m)   Wt 80 kg   SpO2 100%   BMI 31.24 kg/m   Physical Exam Vitals and nursing note reviewed.  Constitutional:      General: She is not in acute distress.    Appearance: She is well-developed.  HENT:     Head: Normocephalic and atraumatic.  Eyes:     Conjunctiva/sclera: Conjunctivae normal.   Cardiovascular:     Rate and Rhythm: Normal rate and regular rhythm.     Heart sounds: No murmur heard. Pulmonary:     Effort: Pulmonary effort is normal. No respiratory distress.     Breath sounds: Normal breath sounds.  Abdominal:     Palpations: Abdomen is soft.     Tenderness: There is no abdominal tenderness.  Musculoskeletal:        General: No swelling.     Cervical back: Neck supple.  Skin:    General: Skin is warm and dry.     Capillary Refill: Capillary refill takes less than 2 seconds.  Neurological:     Mental Status: She is alert. She is disoriented.  Psychiatric:        Mood and Affect: Mood normal.     ED Results and Treatments Labs (all labs ordered are listed, but only abnormal results are displayed) Labs Reviewed  COMPREHENSIVE METABOLIC PANEL - Abnormal; Notable for the following components:      Result Value   Glucose, Bld 109 (*)    Calcium 8.7 (*)    All other components within normal limits  URINALYSIS, ROUTINE W REFLEX MICROSCOPIC - Abnormal; Notable for the following components:   Color, Urine STRAW (*)    All other components within normal limits  BASIC METABOLIC PANEL - Abnormal; Notable for the following components:   Glucose, Bld 100 (*)    BUN 7 (*)    All other components within normal limits  CBC - Abnormal; Notable for the following components:   Platelets 142 (*)    All other components within normal limits  CBG MONITORING, ED - Abnormal; Notable for the following components:   Glucose-Capillary 107 (*)    All other components within normal limits  URINE CULTURE  CBC WITH DIFFERENTIAL/PLATELET  RAPID URINE DRUG SCREEN, HOSP PERFORMED  VITAMIN B12  FOLATE                                                                                                                          Radiology CT Head Wo Contrast  Result Date: 05/01/2022 CLINICAL DATA:  Delirium. EXAM: CT HEAD WITHOUT CONTRAST TECHNIQUE: Contiguous axial images were  obtained from the base of the skull through the vertex without intravenous contrast. RADIATION DOSE REDUCTION: This exam was performed according to the departmental dose-optimization program which includes automated exposure control, adjustment of the mA and/or kV according to patient size and/or use of iterative reconstruction technique. COMPARISON:  Head CT dated 01/23/2018. FINDINGS: Brain: Moderate age-related atrophy and chronic microvascular ischemic changes. There is no acute intracranial hemorrhage. No mass effect or midline shift. No extra-axial fluid collection. Vascular: No hyperdense vessel or unexpected calcification. Skull: Normal. Negative for fracture or focal lesion. Sinuses/Orbits: No acute finding. Other: None IMPRESSION: 1. No acute intracranial pathology. 2. Moderate age-related atrophy and chronic microvascular ischemic changes. Electronically Signed   By: Elgie Collard M.D.   On: 05/01/2022 19:28    Pertinent labs & imaging results that were available during my care of the patient were reviewed by me and considered in my medical decision making (see MDM for details).  Medications Ordered in ED Medications  amLODipine (NORVASC) tablet 5 mg (5 mg Oral  Given 05/02/22 1049)  enoxaparin (LOVENOX) injection 40 mg (40 mg Subcutaneous Patient Refused/Not Given 05/02/22 0629)  0.9 %  sodium chloride infusion ( Intravenous Patient Refused/Not Given 05/02/22 0001)  acetaminophen (TYLENOL) tablet 650 mg (has no administration in time range)    Or  acetaminophen (TYLENOL) suppository 650 mg (has no administration in time range)  magnesium oxide (MAG-OX) tablet 400 mg (400 mg Oral Given 05/02/22 1049)  haloperidol lactate (HALDOL) injection 5 mg (5 mg Intravenous Given 05/02/22 0013)  pantoprazole (PROTONIX) EC tablet 40 mg (40 mg Oral Given 05/02/22 1049)  LORazepam (ATIVAN) injection 2 mg (2 mg Intravenous Given 05/02/22 0013)                                                                                                                                      Procedures Procedures  (including critical care time)  Medical Decision Making / ED Course   This patient presents to the ED for concern of altered mental status, this involves an extensive number of treatment options, and is a complaint that carries with it a high risk of complications and morbidity.  The differential diagnosis includes polypharmacy, delirium, progression of underlying dementia, UTI, electrolyte abnormality  MDM: Patient seen emergency room for evaluation of altered mental status.  Physical exam largely unremarkable but the patient is alert and oriented to self only.  Laboratory evaluation is largely unremarkable including a negative urinalysis and UDS.  CT head with no acute pathology.  I do suspect that the patient's presentation is likely polypharmacy related and given rapid change in mental status, patient would benefit from hospital observation to ensure patient returns to previous normal mental status baseline.  Patient then admitted to the hospital service.   Additional history obtained: -Additional history obtained from multiple family members -External records from outside source obtained and reviewed including: Chart review including previous notes, labs, imaging, consultation notes   Lab Tests: -I ordered, reviewed, and interpreted labs.   The pertinent results include:   Labs Reviewed  COMPREHENSIVE METABOLIC PANEL - Abnormal; Notable for the following components:      Result Value   Glucose, Bld 109 (*)    Calcium 8.7 (*)    All other components within normal limits  URINALYSIS, ROUTINE W REFLEX MICROSCOPIC - Abnormal; Notable for the following components:   Color, Urine STRAW (*)    All other components within normal limits  BASIC METABOLIC PANEL - Abnormal; Notable for the following components:   Glucose, Bld 100 (*)    BUN 7 (*)    All other components within normal limits   CBC - Abnormal; Notable for the following components:   Platelets 142 (*)    All other components within normal limits  CBG MONITORING, ED - Abnormal; Notable for the following components:   Glucose-Capillary 107 (*)    All other components within normal limits  URINE CULTURE  CBC  WITH DIFFERENTIAL/PLATELET  RAPID URINE DRUG SCREEN, HOSP PERFORMED  VITAMIN B12  FOLATE      EKG   EKG Interpretation  Date/Time:  Thursday May 01 2022 18:24:36 EST Ventricular Rate:  92 PR Interval:  179 QRS Duration: 102 QT Interval:  383 QTC Calculation: 474 R Axis:   0 Text Interpretation: Sinus rhythm Abnormal R-wave progression, early transition Probable LVH with secondary repol abnrm When compared with ECG of 06/16/2019, Premature ventricular complexes are no longer present Confirmed by Dione Booze ([id number]) on 05/01/2022 11:48:47 PM         Imaging Studies ordered: I ordered imaging studies including CT head I independently visualized and interpreted imaging. I agree with the radiologist interpretation   Medicines ordered and prescription drug management: Meds ordered this encounter  Medications   amLODipine (NORVASC) tablet 5 mg   enoxaparin (LOVENOX) injection 40 mg   0.9 %  sodium chloride infusion   OR Linked Order Group    acetaminophen (TYLENOL) tablet 650 mg    acetaminophen (TYLENOL) suppository 650 mg   magnesium oxide (MAG-OX) tablet 400 mg   DISCONTD: pantoprazole (PROTONIX) EC tablet 20 mg   haloperidol lactate (HALDOL) injection 5 mg   LORazepam (ATIVAN) injection 2 mg   pantoprazole (PROTONIX) EC tablet 40 mg    -I have reviewed the patients home medicines and have made adjustments as needed  Critical interventions none   Cardiac Monitoring: The patient was maintained on a cardiac monitor.  I personally viewed and interpreted the cardiac monitored which showed an underlying rhythm of: NSR  Social Determinants of Health:  Factors impacting patients care  include: none   Reevaluation: After the interventions noted above, I reevaluated the patient and found that they have :stayed the same  Co morbidities that complicate the patient evaluation  Past Medical History:  Diagnosis Date   Anxiety    CHF (congestive heart failure) (HCC)    Closed right ankle fracture    Depression    GERD (gastroesophageal reflux disease)    Hypertension    MRSA infection    OAB (overactive bladder)       Dispostion: I considered admission for this patient, but due to persistent altered mental status, patient will require hospital admission     Final Clinical Impression(s) / ED Diagnoses Final diagnoses:  None     @    Glendora Score, MD 05/02/22 1233

## 2022-05-01 NOTE — ED Notes (Signed)
Pt very confused, agitated, attempted to walk down hallway with gown halfway off, pt says she had to go check on grandson in [place], attempted to explain to pt that grandson is not here, attempted reorienting pt- pt very uncooperative. Pt is unable to be reoriented to situation, however pt did go back to room and is sitting in chair at this time. Pt refuses to let this nurse obtain vitals.

## 2022-05-01 NOTE — ED Notes (Signed)
Pt found in room with all of cords pulled off and had pulled IV out. Pt placed in another room so that pt can be seen more closely.

## 2022-05-02 DIAGNOSIS — K219 Gastro-esophageal reflux disease without esophagitis: Secondary | ICD-10-CM | POA: Diagnosis not present

## 2022-05-02 DIAGNOSIS — R4182 Altered mental status, unspecified: Secondary | ICD-10-CM | POA: Diagnosis not present

## 2022-05-02 DIAGNOSIS — I10 Essential (primary) hypertension: Secondary | ICD-10-CM | POA: Diagnosis not present

## 2022-05-02 DIAGNOSIS — I5032 Chronic diastolic (congestive) heart failure: Secondary | ICD-10-CM | POA: Diagnosis not present

## 2022-05-02 LAB — URINE CULTURE: Culture: NO GROWTH

## 2022-05-02 LAB — CBC
HCT: 43.9 % (ref 36.0–46.0)
Hemoglobin: 14.4 g/dL (ref 12.0–15.0)
MCH: 31.4 pg (ref 26.0–34.0)
MCHC: 32.8 g/dL (ref 30.0–36.0)
MCV: 95.6 fL (ref 80.0–100.0)
Platelets: 142 10*3/uL — ABNORMAL LOW (ref 150–400)
RBC: 4.59 MIL/uL (ref 3.87–5.11)
RDW: 13.3 % (ref 11.5–15.5)
WBC: 8.9 10*3/uL (ref 4.0–10.5)
nRBC: 0 % (ref 0.0–0.2)

## 2022-05-02 LAB — BASIC METABOLIC PANEL
Anion gap: 9 (ref 5–15)
BUN: 7 mg/dL — ABNORMAL LOW (ref 8–23)
CO2: 26 mmol/L (ref 22–32)
Calcium: 9.1 mg/dL (ref 8.9–10.3)
Chloride: 108 mmol/L (ref 98–111)
Creatinine, Ser: 0.63 mg/dL (ref 0.44–1.00)
GFR, Estimated: >60 mL/min (ref >60)
Glucose, Bld: 100 mg/dL — ABNORMAL HIGH (ref 70–99)
Potassium: 3.5 mmol/L (ref 3.5–5.1)
Sodium: 143 mmol/L (ref 135–145)

## 2022-05-02 MED ORDER — LORAZEPAM 2 MG/ML IJ SOLN
2.0000 mg | Freq: Once | INTRAMUSCULAR | Status: AC
  Administered 2022-05-02: 2 mg via INTRAVENOUS
  Filled 2022-05-02: qty 1

## 2022-05-02 MED ORDER — PANTOPRAZOLE SODIUM 40 MG PO TBEC
40.0000 mg | DELAYED_RELEASE_TABLET | Freq: Every day | ORAL | Status: DC
  Administered 2022-05-02 – 2022-05-03 (×2): 40 mg via ORAL
  Filled 2022-05-02 (×2): qty 1

## 2022-05-02 MED ORDER — CYANOCOBALAMIN 1000 MCG/ML IJ SOLN: 1000.0000 ug | Freq: Once | INTRAMUSCULAR | Status: DC

## 2022-05-02 MED ORDER — HALOPERIDOL LACTATE 5 MG/ML IJ SOLN
5.0000 mg | Freq: Four times a day (QID) | INTRAMUSCULAR | Status: DC | PRN
  Administered 2022-05-02: 5 mg via INTRAVENOUS
  Filled 2022-05-02 (×2): qty 1

## 2022-05-02 NOTE — ED Notes (Signed)
Pt sleeping at this time- visible chest rise and fall noted.

## 2022-05-02 NOTE — Plan of Care (Signed)

## 2022-05-02 NOTE — Care Management Obs Status (Signed)
MEDICARE OBSERVATION STATUS NOTIFICATION   Patient Details  Name: Kari Young MRN: [id number] Date of Birth: 04-06-1941   Medicare Observation Status Notification Given:  No (unable to reach spouse or daughters by phone, left voicemail for Claudean Kinds (daughter) at [phone] to return call.)    Corey Harold 05/02/2022, 4:23 PM

## 2022-05-02 NOTE — Progress Notes (Signed)
Progress Note   Patient: Kari Young QIH:[id number] DOB: 1940/07/29 DOA: 05/01/2022     0 DOS: the patient was seen and examined on 05/02/2022   Brief hospital course: As per H&P written by Dr. Randol Kern on 05/02/2022 Kari Young  is a 81 y.o. female, with past medical history of CHF, hypertension, anxiety, recent diagnosis with UTI, treated with Augmentin, patient brought to ED by her husband for altered mental status, was obtained from the husband by phone([phone]), husband report patient went to sleep on the couch, then rolled off the couch onto the ground, when he woke her up on the ground she was excessively sleepy, difficult to arouse, she was excessively confused, so he called EMS, and ED upon initial evaluation she was not oriented to time and place, currently she is oriented to place but not time, husband denies any use of alcohol or illicit drug use, husband reported patient In Charge of filling her pillbox from her medicine, upon reviewing her meds it does appear she is having multiple bottles of the same meds, some few months old, upon reviewing her pillbox as well it does appear to have different pills each different day. -In ED CT head with no acute findings, no significant labs abnormalities, patient had no focal deficits, [facility] hospitalist consulted to admit  Assessment and Plan: 1-altered mental status/metabolic encephalopathy -Appears to be secondary to polypharmacy -No signs of acute infection appreciated -Recent TSH within normal limits and CT head without acute intracranial abnormalities. -As an outpatient Mini-Mental status exam demonstrating moderate dementia with a score of 17 out of 30. -After holding medications patient mentation is improving. -Discussion with husband to assist with filling pillboxes and administering medications sustained. -PT/OT recommended at time of discharge -B12 borderline low will initiate supplementation. -Gentle hydration will  be continue and if otherwise stable planning to discharge home in a.m.  2-chronic diastolic heart failure -Euvolemic and compensated -Continue heart healthy/low-sodium diet and daily weights.  3-HTN -Stable overall -Continue current antihypertensive agents.  4-GERD -Continue PPI.  Subjective:  Improving, no chest pain, no nausea, no vomiting.  Mentation close to baseline according to husband at bedside.  Physical therapy has recommended home health PT/OT.  Physical Exam: Vitals:   05/02/22 0200 05/02/22 0637 05/02/22 0802 05/02/22 0830  BP: (!) 161/80  (!) 159/85 (!) 183/78  Pulse: 71  70 71  Resp: 17  20 20   Temp:  97.9 F (36.6 C) 97.6 F (36.4 C) (!) 97.5 F (36.4 C)  TempSrc:  Axillary Oral Oral  SpO2: 97%  94% 100%  Weight:      Height:       General exam: Alert, awake, oriented to person and place; still intermittently confused.  Per husband at bedside close to baseline.  No fever, no chest pain, no shortness of breath. Respiratory system: Clear to auscultation. Respiratory effort normal.  Good saturation on room air. Cardiovascular system:RRR. No rubs or gallops; no JVD. Gastrointestinal system: Abdomen is obese, nondistended, soft and nontender. No organomegaly or masses felt. Normal bowel sounds heard. Central nervous system: No focal neurological deficits. Extremities: No cyanosis or clubbing. Skin: No petechiae. Psychiatry: Mood & affect appropriate.   Data Reviewed: CBC: WBCs 8.9, hemoglobin 14.4 and platelet count 142 K Basic metabolic panel: Sodium 143, potassium 3.5, chloride 108, bicarb 26, BUN 7, creatinine 0.63 and GFR more than 60. B12 225  Family Communication: Husband at bedside  Disposition: Status is: Observation The patient remains OBS appropriate and will d/c before 2  midnights.   Planned Discharge Destination: Home with Home Health   Time spent: 35 minutes  Author: Vassie Loll, MD 05/02/2022 5:23 PM  For on call review  [email or web address].

## 2022-05-02 NOTE — Care Management Important Message (Deleted)
Important Message  Patient Details  Name: Kari Young MRN: [id number] Date of Birth: Jun 10, 1940   Medicare Important Message Given:      unable to reach spouse or daughters by phone, left voicemail for Claudean Kinds (daughter) at [phone] to return call.    Corey Harold 05/02/2022, 4:22 PM

## 2022-05-02 NOTE — Progress Notes (Signed)
Patient hollering for her mother. "Mama!" "Mama!" Went in to assess patient. Reoriented.  Call bell within reach, bed alarm in place.

## 2022-05-02 NOTE — ED Notes (Signed)
Pt had full bed change and gown changed. Purewick changed and in place and pt is resting at this time.

## 2022-05-02 NOTE — ED Notes (Signed)
This nurse attempted to call pt spouse to assist with re-orientation and calming pt down, however there was no answer, also called pt daughters who are listed on chart and no one answered

## 2022-05-02 NOTE — Plan of Care (Signed)
  Problem: Acute Rehab PT Goals(only PT should resolve) Goal: Pt Will Go Supine/Side To Sit Outcome: Progressing Flowsheets (Taken 05/02/2022 1505) Pt will go Supine/Side to Sit:  Independently  with modified independence Goal: Patient Will Transfer Sit To/From Stand Outcome: Progressing Flowsheets (Taken 05/02/2022 1505) Patient will transfer sit to/from stand:  Independently  with modified independence Goal: Pt Will Transfer Bed To Chair/Chair To Bed Outcome: Progressing Flowsheets (Taken 05/02/2022 1505) Pt will Transfer Bed to Chair/Chair to Bed:  Independently  with modified independence Goal: Pt Will Ambulate Outcome: Progressing Flowsheets (Taken 05/02/2022 1505) Pt will Ambulate:  > 125 feet  with modified independence  with least restrictive assistive device  with supervision   3:06 PM, 05/02/22 Kari Young, MPT Physical Therapist with [facility] [phone] office [phone] mobile phone

## 2022-05-02 NOTE — ED Notes (Addendum)
Pt sleeping, BP cuff and O2 monitor applied to obtain vitals as pt refused and would not cooperate earlier

## 2022-05-02 NOTE — Evaluation (Signed)
Occupational Therapy Evaluation Patient Details Name: Kari Young MRN: [id number] DOB: 07-Dec-1940 Today's Date: 05/02/2022   History of Present Illness 81 y.o. F admitted on 05/01/22 due to fall from couch and AMS. CT is negative. PMH significant for CHF, HTN, anxiety, and possible dementia.   Clinical Impression   Pt admitted for concerns listed above. PTA pt reported that she was overall independent with all ADL's and most IADL's. Her husband can and will assist her when she needs it. At this time, pt presents with increased weakness and balance deficits. She is requiring min guard for all ADL's and functional mobility, with intermittent min A due to cognition. Unsure of her baseline for cognition, she presents this session with slow response times and decreased safety awareness. Recommending HHOT and acute OT will sign off at this time.       Recommendations for follow up therapy are one component of a multi-disciplinary discharge planning process, led by the attending physician.  Recommendations may be updated based on patient status, additional functional criteria and insurance authorization.   Follow Up Recommendations  Home health OT     Assistance Recommended at Discharge Intermittent Supervision/Assistance  Patient can return home with the following A little help with walking and/or transfers;A little help with bathing/dressing/bathroom;Direct supervision/assist for medications management;Direct supervision/assist for financial management    Functional Status Assessment  Patient has had a recent decline in their functional status and demonstrates the ability to make significant improvements in function in a reasonable and predictable amount of time.  Equipment Recommendations  None recommended by OT    Recommendations for Other Services       Precautions / Restrictions Precautions Precautions: Fall Restrictions Weight Bearing Restrictions: No      Mobility Bed  Mobility               General bed mobility comments: Up with PT on arrival    Transfers Overall transfer level: Needs assistance Equipment used: Rolling walker (2 wheels), None Transfers: Sit to/from Stand Sit to Stand: Min guard           General transfer comment: Min guard for safety with and without the RW      Balance Overall balance assessment: Mild deficits observed, not formally tested                                         ADL either performed or assessed with clinical judgement   ADL Overall ADL's : Needs assistance/impaired Eating/Feeding: Set up;Sitting   Grooming: Min guard;Standing   Upper Body Bathing: Min guard;Sitting   Lower Body Bathing: Min guard;Minimal assistance;Sitting/lateral leans   Upper Body Dressing : Set up;Sitting   Lower Body Dressing: Min guard;Minimal assistance;Sitting/lateral leans;Sit to/from stand   Toilet Transfer: Min guard;Ambulation   Toileting- Clothing Manipulation and Hygiene: Min guard;Sit to/from stand;Sitting/lateral lean       Functional mobility during ADLs: Min guard General ADL Comments: Overall min guard with intermittent min A for verbal cuing and sequencing     Vision Baseline Vision/History: 1 Wears glasses Ability to See in Adequate Light: 0 Adequate Patient Visual Report: No change from baseline Vision Assessment?: No apparent visual deficits     Perception Perception Perception Tested?: No   Praxis Praxis Praxis tested?: Not tested    Pertinent Vitals/Pain Pain Assessment Pain Assessment: No/denies pain     Hand Dominance Right  Extremity/Trunk Assessment Upper Extremity Assessment Upper Extremity Assessment: Overall WFL for tasks assessed   Lower Extremity Assessment Lower Extremity Assessment: Defer to PT evaluation   Cervical / Trunk Assessment Cervical / Trunk Assessment: Normal   Communication Communication Communication: No difficulties   Cognition  Arousal/Alertness: Awake/alert Behavior During Therapy: WFL for tasks assessed/performed Overall Cognitive Status: No family/caregiver present to determine baseline cognitive functioning                                 General Comments: Pt slow to respond at times and  appears to have some difficulty with awareness     General Comments  VSS on RA    Exercises     Shoulder Instructions      Home Living Family/patient expects to be discharged to:: Private residence Living Arrangements: Spouse/significant other Available Help at Discharge: Family;Available 24 hours/day Type of Home: House Home Access: Ramped entrance     Home Layout: One level     Bathroom Shower/Tub: Chief Strategy Officer: Standard Bathroom Accessibility: Yes How Accessible: Accessible via walker Home Equipment: Rolling Walker (2 wheels);Cane - single point;BSC/3in1;Shower seat;Grab bars - toilet;Grab bars - tub/shower          Prior Functioning/Environment Prior Level of Function : Independent/Modified Independent             Mobility Comments: Typically does not use AD ADLs Comments: Reports independence, even with medication management        OT Problem List: Decreased strength;Decreased activity tolerance;Impaired balance (sitting and/or standing);Decreased cognition;Decreased safety awareness      OT Treatment/Interventions:      OT Goals(Current goals can be found in the care plan section) Acute Rehab OT Goals Patient Stated Goal: To go home OT Goal Formulation: With patient Time For Goal Achievement: 05/02/22 Potential to Achieve Goals: Good  OT Frequency:      Co-evaluation              AM-PAC OT "6 Clicks" Daily Activity     Outcome Measure Help from another person eating meals?: A Little Help from another person taking care of personal grooming?: A Little Help from another person toileting, which includes using toliet, bedpan, or urinal?: A  Little Help from another person bathing (including washing, rinsing, drying)?: A Little Help from another person to put on and taking off regular upper body clothing?: A Little Help from another person to put on and taking off regular lower body clothing?: A Little 6 Click Score: 18   End of Session Equipment Utilized During Treatment: Rolling walker (2 wheels);Gait belt Nurse Communication: Mobility status  Activity Tolerance: Patient tolerated treatment well Patient left: with call bell/phone within reach;in chair;with chair alarm set  OT Visit Diagnosis: Unsteadiness on feet (R26.81);Other abnormalities of gait and mobility (R26.89);Muscle weakness (generalized) (M62.81)                Time: 8756-4332 OT Time Calculation (min): 9 min Charges:  OT General Charges $OT Visit: 1 Visit OT Evaluation $OT Eval Low Complexity: 1 Low  Hajime Asfaw Bing Plume, OTR/L [facility]  Trivia Heffelfinger Elane Gibbs 05/02/2022, 10:07 AM

## 2022-05-02 NOTE — Evaluation (Signed)
Physical Therapy Evaluation Patient Details Name: Kari Young MRN: [id number] DOB: 09-Aug-1940 Today's Date: 05/02/2022  History of Present Illness  Ayona Yniguez  is a 81 y.o. female, with past medical history of CHF, hypertension, anxiety, recent diagnosis with UTI, treated with Augmentin, patient brought to ED by her husband for altered mental status, was obtained from the husband by [phone]), husband report patient went to sleep on the couch, then rolled off the couch onto the ground, when he woke her up on the ground she was excessively sleepy, difficult to arouse, she was excessively confused, so he called EMS, and ED upon initial evaluation she was not oriented to time and place, currently she is oriented to place but not time, husband denies any use of alcohol or illicit drug use, husband reported patient In Charge of filling her pillbox from her medicine, upon reviewing her meds it does appear she is having multiple bottles of the same meds, some few months old, upon reviewing her pillbox as well it does appear to have different pills each different day.   Clinical Impression  Patient functioning near baseline for functional mobility and gait demonstrating good return for bed mobility, transferring to/from chair and commode in bathroom without loss of balance, can walk short distances without AD, but occasionally has to lean on nearby objects for support, safer using RW and ambulated in room/hallway without loss of balance.  Patient tolerated sitting up in chair after therapy - nurse aware.  Patient will benefit from continued skilled physical therapy in hospital and recommended venue below to increase strength, balance, endurance for safe ADLs and gait.          Recommendations for follow up therapy are one component of a multi-disciplinary discharge planning process, led by the attending physician.  Recommendations may be updated based on patient status, additional  functional criteria and insurance authorization.  Follow Up Recommendations Home health PT      Assistance Recommended at Discharge Intermittent Supervision/Assistance  Patient can return home with the following  A little help with walking and/or transfers;A little help with bathing/dressing/bathroom;Help with stairs or ramp for entrance;Assistance with cooking/housework    Equipment Recommendations None recommended by PT  Recommendations for Other Services       Functional Status Assessment Patient has had a recent decline in their functional status and demonstrates the ability to make significant improvements in function in a reasonable and predictable amount of time.     Precautions / Restrictions Precautions Precautions: Fall Restrictions Weight Bearing Restrictions: No      Mobility  Bed Mobility Overal bed mobility: Modified Independent                  Transfers Overall transfer level: Needs assistance Equipment used: None, Rolling walker (2 wheels) Transfers: Sit to/from Stand, Bed to chair/wheelchair/BSC Sit to Stand: Supervision   Step pivot transfers: Min guard       General transfer comment: good return for transferring to/from chair and commode in bathroom without loss of balance, has to occasionally lean on nearby objects when not using an AD and safer using RW    Ambulation/Gait Ambulation/Gait assistance: Supervision, Min guard Gait Distance (Feet): 100 Feet Assistive device: Rolling walker (2 wheels) Gait Pattern/deviations: Decreased step length - right, Decreased step length - left, Decreased stride length Gait velocity: decreased     General Gait Details: can walk short distances without AD, safer using RW for longer distances without loss of balance  Stairs  Wheelchair Mobility    Modified Rankin (Stroke Patients Only)       Balance Overall balance assessment: Needs assistance Sitting-balance support: Feet  supported, No upper extremity supported Sitting balance-Leahy Scale: Good Sitting balance - Comments: seated at EOB   Standing balance support: During functional activity, No upper extremity supported Standing balance-Leahy Scale: Fair Standing balance comment: fair/good using RW                             Pertinent Vitals/Pain Pain Assessment Pain Assessment: No/denies pain    Home Living Family/patient expects to be discharged to:: Private residence Living Arrangements: Spouse/significant other Available Help at Discharge: Family;Available 24 hours/day Type of Home: House Home Access: Ramped entrance       Home Layout: One level Home Equipment: Agricultural consultant (2 wheels);Cane - single point;BSC/3in1;Shower seat;Grab bars - toilet;Grab bars - tub/shower Additional Comments: all information, "per patient"    Prior Function Prior Level of Function : Independent/Modified Independent             Mobility Comments: household and short distance community ambulator without AD ADLs Comments: assisted by family     Hand Dominance   Dominant Hand: Right    Extremity/Trunk Assessment   Upper Extremity Assessment Upper Extremity Assessment: Defer to OT evaluation    Lower Extremity Assessment Lower Extremity Assessment: Generalized weakness    Cervical / Trunk Assessment Cervical / Trunk Assessment: Normal  Communication   Communication: No difficulties  Cognition Arousal/Alertness: Awake/alert Behavior During Therapy: WFL for tasks assessed/performed Overall Cognitive Status: No family/caregiver present to determine baseline cognitive functioning                                 General Comments: Patient able to follow directions consistently        General Comments      Exercises     Assessment/Plan    PT Assessment Patient needs continued PT services  PT Problem List Decreased strength;Decreased activity tolerance;Decreased  balance;Decreased mobility       PT Treatment Interventions DME instruction;Gait training;Stair training;Functional mobility training;Therapeutic activities;Therapeutic exercise;Balance training;Patient/family education    PT Goals (Current goals can be found in the Care Plan section)  Acute Rehab PT Goals Patient Stated Goal: return home with family to assist PT Goal Formulation: With patient Time For Goal Achievement: 05/05/22 Potential to Achieve Goals: Good    Frequency Min 3X/week     Co-evaluation PT/OT/SLP Co-Evaluation/Treatment: Yes Reason for Co-Treatment: To address functional/ADL transfers PT goals addressed during session: Mobility/safety with mobility;Balance;Proper use of DME         AM-PAC PT "6 Clicks" Mobility  Outcome Measure Help needed turning from your back to your side while in a flat bed without using bedrails?: None Help needed moving from lying on your back to sitting on the side of a flat bed without using bedrails?: None Help needed moving to and from a bed to a chair (including a wheelchair)?: A Little Help needed standing up from a chair using your arms (e.g., wheelchair or bedside chair)?: A Little Help needed to walk in hospital room?: A Little Help needed climbing 3-5 steps with a railing? : A Little 6 Click Score: 20    End of Session   Activity Tolerance: Patient tolerated treatment well Patient left: in chair;with call bell/phone within reach;with chair alarm set Nurse Communication: Mobility status  PT Visit Diagnosis: Unsteadiness on feet (R26.81);Other abnormalities of gait and mobility (R26.89);Muscle weakness (generalized) (M62.81)    Time: 6734-1937 PT Time Calculation (min) (ACUTE ONLY): 22 min   Charges:   PT Evaluation $PT Eval Moderate Complexity: 1 Mod PT Treatments $Therapeutic Activity: 8-22 mins        3:03 PM, 05/02/22 Ocie Bob, MPT Physical Therapist with [facility] [phone]  office [phone] mobile phone

## 2022-05-02 NOTE — TOC Initial Note (Addendum)
Transition of Care [facility]) - Initial/Assessment Note    Patient Details  Name: Kari Young MRN: [id number] Date of Birth: 09-08-1940  [facility] CM/SW Contact:    Karn Cassis, LCSW Phone Number: 05/02/2022, 10:35 AM  Clinical Narrative: Pt admitted due to altered mental status. Assessment completed with pt's husband as pt oriented to self and place only per chart. Pt's husband indicates pt has CAP aid from 11-3 during the week. No active home health services. PT/OT recommend home health. Discussed with pt who is agreeable with no preference on agency. Pt's husband is not interested in RN at this time. Referred and accepted by Clifton Custard with [facility]. TOC will continue to follow.                   Expected Discharge Plan: Home w Home Health Services Barriers to Discharge: Continued Medical Work up   Patient Goals and CMS Choice Patient states their goals for this hospitalization and ongoing recovery are:: return home   Choice offered to / list presented to : Spouse  Expected Discharge Plan and Services Expected Discharge Plan: Home w Home Health Services In-house Referral: Clinical Social Work   Post Acute Care Choice: Home Health Living arrangements for the past 2 months: Single Family Home                           HH Arranged: PT, OT HH Agency: [facility] Date [facility] Agency Contacted: 05/02/22 Time HH Agency Contacted: 1034 Representative spoke with at [facility] Agency: Clifton Custard  Prior Living Arrangements/Services Living arrangements for the past 2 months: Single Family Home Lives with:: Spouse Patient language and need for interpreter reviewed:: Yes Do you feel safe going back to the place where you live?: Yes      Need for Family Participation in Patient Care: Yes (Comment) Care giver support system in place?: Yes (comment) Current home services: DME (walker) Criminal Activity/Legal Involvement Pertinent to Current  Situation/Hospitalization: No - Comment as needed  Activities of Daily Living Home Assistive Devices/Equipment: None ADL Screening (condition at time of admission) Patient's cognitive ability adequate to safely complete daily activities?: Yes Is the patient deaf or have difficulty hearing?: No Does the patient have difficulty seeing, even when wearing glasses/contacts?: No Does the patient have difficulty concentrating, remembering, or making decisions?: Yes Patient able to express need for assistance with ADLs?: Yes Does the patient have difficulty dressing or bathing?: No Independently performs ADLs?: Yes (appropriate for developmental age) Does the patient have difficulty walking or climbing stairs?: No Weakness of Legs: Both Weakness of Arms/Hands: Both  Permission Sought/Granted                  Emotional Assessment   Attitude/Demeanor/Rapport: Unable to Assess   Orientation: : Oriented to Self, Oriented to Place Alcohol / Substance Use: Not Applicable Psych Involvement: No (comment)  Admission diagnosis:  AMS (altered mental status) [R41.82] Patient Active Problem List   Diagnosis Date Noted   AMS (altered mental status) 05/01/2022   Near syncope 01/25/2018   Bradycardia    Chest tightness    Hypertension 01/23/2018   Sinus bradycardia by electrocardiogram 01/23/2018   Postural dizziness with near syncope 01/23/2018   Frequent falls 01/23/2018   Thoracic compression fracture, closed, initial encounter (HCC) 01/23/2018   Chronic diastolic CHF (congestive heart failure) (HCC) 01/23/2018   COLLES' FRACTURE, LEFT 11/12/2009   PCP:  [facility]:   [facility] - [place], [place] - [address] [place] [address] Phone: [phone] Fax: [phone]  [facility] [place], [place] - [address] [place] [address] Phone: [phone] Fax:  [phone]     Social Determinants of Health (SDOH) Interventions    Readmission Risk Interventions     No data to display

## 2022-05-02 NOTE — Plan of Care (Signed)
  Problem: Education: Goal: Knowledge of General Education information will improve Description: Including pain rating scale, medication(s)/side effects and non-pharmacologic comfort measures Outcome: Not Progressing   Problem: Clinical Measurements: Goal: Will remain free from infection Outcome: Progressing Goal: Diagnostic test results will improve Outcome: Progressing Goal: Respiratory complications will improve Outcome: Progressing Goal: Cardiovascular complication will be avoided Outcome: Progressing

## 2022-05-03 DIAGNOSIS — I10 Essential (primary) hypertension: Secondary | ICD-10-CM | POA: Diagnosis not present

## 2022-05-03 DIAGNOSIS — R5381 Other malaise: Secondary | ICD-10-CM

## 2022-05-03 DIAGNOSIS — F0390 Unspecified dementia without behavioral disturbance: Secondary | ICD-10-CM

## 2022-05-03 DIAGNOSIS — E860 Dehydration: Secondary | ICD-10-CM

## 2022-05-03 DIAGNOSIS — R4182 Altered mental status, unspecified: Secondary | ICD-10-CM | POA: Diagnosis not present

## 2022-05-03 DIAGNOSIS — I5032 Chronic diastolic (congestive) heart failure: Secondary | ICD-10-CM | POA: Diagnosis not present

## 2022-05-03 MED ORDER — BUSPIRONE HCL 10 MG PO TABS: 10.0000 mg | ORAL_TABLET | Freq: Two times a day (BID) | ORAL | Status: AC

## 2022-05-03 MED ORDER — FUROSEMIDE 20 MG PO TABS: 20.0000 mg | ORAL_TABLET | Freq: Every day | ORAL | Status: AC | PRN

## 2022-05-03 MED ORDER — VITAMIN B-12 1000 MCG PO TABS: 1000.0000 ug | ORAL_TABLET | Freq: Two times a day (BID) | ORAL | 3 refills | Status: AC

## 2022-05-03 NOTE — Progress Notes (Signed)
CSW spoke with [place] at [place] who states the referral was accepted. The agency will follow up with the patient directly to initiate services.  Edwin Dada, MSW, LCSW Transitions of Care  Clinical Social Worker II [phone]

## 2022-05-03 NOTE — Discharge Summary (Signed)
Physician Discharge Summary   Patient: Kari Young MRN: [id number] DOB: [birth year]  Admit date:     05/01/2022  Discharge date: 05/03/22  Discharge Physician: Vassie Loll   PCP: [facility]   Recommendations at discharge:  Repeat basic metabolic panel to follow electrolytes and renal function Reassess blood pressure and adjust medication as needed Follow-up B12 level and decide the need for further intramuscular repletion patient failed to mount labels with oral supplementation.   Discharge Diagnoses: Principal Problem:   AMS (altered mental status) Active Problems:   Hypertension   Chronic diastolic CHF (congestive heart failure) (HCC)   Dehydration   Dementia without behavioral disturbance [facility])   Physical deconditioning   Hospital Course: As per H&P written by Dr. Randol Kern on 05/02/2022 Kari Young  is a 81 y.o. female, with past medical history of CHF, hypertension, anxiety, recent diagnosis with UTI, treated with Augmentin, patient brought to ED by her husband for altered mental status, was obtained from the husband by phone([phone]), husband report patient went to sleep on the couch, then rolled off the couch onto the ground, when he woke her up on the ground she was excessively sleepy, difficult to arouse, she was excessively confused, so he called EMS, and ED upon initial evaluation she was not oriented to time and place, currently she is oriented to place but not time, husband denies any use of alcohol or illicit drug use, husband reported patient In Charge of filling her pillbox from her medicine, upon reviewing her meds it does appear she is having multiple bottles of the same meds, some few months old, upon reviewing her pillbox as well it does appear to have different pills each different day. -In ED CT head with no acute findings, no significant labs abnormalities, patient had no focal deficits, [facility] hospitalist consulted to  admit  Assessment and Plan: 1-altered mental status/metabolic encephalopathy -Appears to be secondary to polypharmacy -No signs of acute infection appreciated -Recent TSH within normal limits and CT head without acute intracranial abnormalities. -As an outpatient Mini-Mental status exam demonstrating moderate dementia with a score of 17 out of 30. -After holding medications patient mentation is improving. -Discussion with husband to assist with filling pillboxes and administering medications sustained. -PT/OT recommended at time of discharge -B12 borderline low, will initiate supplementation. -Gentle hydration will be continue and if otherwise stable planning to discharge home in a.m.   2-chronic diastolic heart failure -Euvolemic and compensated -Continue heart healthy/low-sodium diet and daily weights. -Resume as needed Lasix at discharge. -Adequate hydration recommended.   3-HTN -Stable overall -Continue current antihypertensive agents.   4-GERD -Continue PPI.  5-depression/anxiety -Home medication has been resumed with instructions for family to be in regard of prescription management and dispenses -Adjust medications as needed.  6-physical deconditioning -PT have seen patient with recommendation for home health PT at discharge.   Consultants: None Procedures performed: See below for x-ray reports. Disposition: Home with home health services. Diet recommendation: Heart healthy/low-sodium diet.  DISCHARGE MEDICATION: Allergies as of 05/03/2022       Reactions   Percocet [oxycodone-acetaminophen] Itching   Sulfamethoxazole Nausea Only        Medication List     STOP taking these medications    conjugated estrogens 0.625 MG/GM vaginal cream Commonly known as: PREMARIN   nitrofurantoin (macrocrystal-monohydrate) 100 MG capsule Commonly known as: MACROBID   ondansetron 4 MG disintegrating tablet Commonly known as: Zofran ODT   potassium chloride 10 MEQ  tablet Commonly  known as: KLOR-CON   tolterodine 4 MG 24 hr capsule Commonly known as: DETROL LA       TAKE these medications    amLODipine 5 MG tablet Commonly known as: NORVASC Take 5 mg by mouth daily.   ARIPiprazole 5 MG tablet Commonly known as: ABILIFY Take 5 mg by mouth daily.   benztropine 1 MG tablet Commonly known as: COGENTIN Take by mouth daily.   busPIRone 10 MG tablet Commonly known as: BUSPAR Take 1 tablet (10 mg total) by mouth every 12 (twelve) hours. What changed: when to take this   Cholecalciferol 25 MCG (1000 UT) tablet Take 1,000 Units by mouth daily.   cyanocobalamin 1000 MCG tablet Commonly known as: VITAMIN B12 Take 1 tablet (1,000 mcg total) by mouth in the morning and at bedtime. What changed:  medication strength how much to take when to take this   furosemide 20 MG tablet Commonly known as: LASIX Take 1 tablet (20 mg total) by mouth daily as needed for fluid or edema. What changed:  when to take this reasons to take this   levocetirizine 5 MG tablet Commonly known as: XYZAL Take 1 tablet by mouth every evening.   magnesium oxide 400 MG tablet Commonly known as: MAG-OX Take 400 mg by mouth daily.   pantoprazole 40 MG tablet Commonly known as: PROTONIX Take 40 mg by mouth daily.   polyethylene glycol 17 g packet Commonly known as: MIRALAX / GLYCOLAX Take 17 g by mouth 2 (two) times daily.   Risa-Bid Probiotic Tabs Take 1 tablet by mouth twice a day.   senna-docusate 8.6-50 MG tablet Commonly known as: Senokot-S Take 1 tablet by mouth daily.   venlafaxine XR 150 MG 24 hr capsule Commonly known as: EFFEXOR-XR Take 150 mg by mouth daily.   VITAMIN C PO Take 1 tablet by mouth daily.   zolpidem 10 MG tablet Commonly known as: AMBIEN Take 10 mg by mouth at bedtime as needed for sleep.        Follow-up Information     [facility].   Specialty: Home Health Services Why: Will contact you to  schedule home health visits. Contact information: [address] [place] [address] [phone]         [facility]. Schedule an appointment as soon as possible for a visit in 10 day(s).   Contact information: [address] [place] [address] [phone]                Discharge Exam: Ceasar Mons Weights   05/01/22 1822  Weight: 80 kg    General exam: Alert, awake, oriented to person and place; per husband at bedside mentation is back to baseline currently.  Patient in no acute distress and ready to go home.   Respiratory system: Clear to auscultation. Respiratory effort normal.  Good saturation on room air. Cardiovascular system:RRR. No rubs or gallops; no JVD. Gastrointestinal system: Abdomen is obese, nondistended, soft and nontender. No organomegaly or masses felt. Normal bowel sounds heard. Central nervous system: No focal neurological deficits. Extremities: No cyanosis or clubbing. Skin: No petechiae. Psychiatry: Mood & affect appropriate.     Condition at discharge: Stable and improved.  The results of significant diagnostics from this hospitalization (including imaging, microbiology, ancillary and laboratory) are listed below for reference.   Imaging Studies: CT Head Wo Contrast  Result Date: 05/01/2022 CLINICAL DATA:  Delirium. EXAM: CT HEAD WITHOUT CONTRAST  TECHNIQUE: Contiguous axial images were obtained from the base of the skull through the vertex without intravenous contrast. RADIATION DOSE REDUCTION: This exam was performed according to the departmental dose-optimization program which includes automated exposure control, adjustment of the mA and/or kV according to patient size and/or use of iterative reconstruction technique. COMPARISON:  Head CT dated 01/23/2018. FINDINGS: Brain: Moderate age-related atrophy and chronic microvascular ischemic changes. There is no acute intracranial hemorrhage. No mass effect or midline  shift. No extra-axial fluid collection. Vascular: No hyperdense vessel or unexpected calcification. Skull: Normal. Negative for fracture or focal lesion. Sinuses/Orbits: No acute finding. Other: None IMPRESSION: 1. No acute intracranial pathology. 2. Moderate age-related atrophy and chronic microvascular ischemic changes. Electronically Signed   By: Elgie Collard M.D.   On: 05/01/2022 19:28    Microbiology: Results for orders placed or performed during the hospital encounter of 05/01/22  Urine Culture     Status: None   Collection Time: 05/01/22  6:40 PM   Specimen: Urine, Clean Catch  Result Value Ref Range Status   Specimen Description   Final    URINE, CLEAN CATCH Performed at [facility], [address] [place], [place] [address]    Special Requests   Final    NONE Performed at [facility], [address] [place], [place] [address]    Culture   Final    NO GROWTH Performed at [facility], [address] [place], [place] [address]    Report Status 05/02/2022 FINAL  Final    Labs: CBC: Recent Labs  Lab 05/01/22 1839 05/02/22 0556  WBC 6.7 8.9  NEUTROABS 4.0  --   HGB 14.3 14.4  HCT 43.1 43.9  MCV 94.7 95.6  PLT 151 142*   Basic Metabolic Panel: Recent Labs  Lab 05/01/22 1839 05/02/22 0556  NA 141 143  K 3.9 3.5  CL 108 108  CO2 27 26  GLUCOSE 109* 100*  BUN 9 7*  CREATININE 0.68 0.63  CALCIUM 8.7* 9.1   Liver Function Tests: Recent Labs  Lab 05/01/22 1839  AST 17  ALT 14  ALKPHOS 87  BILITOT 0.5  PROT 7.4  ALBUMIN 4.1   CBG: Recent Labs  Lab 05/01/22 1825  GLUCAP 107*    Discharge time spent: greater than 30 minutes.  Signed: Vassie Loll, MD [facility] 05/03/2022

## 2023-08-11 ENCOUNTER — Encounter (HOSPITAL_COMMUNITY): Payer: Self-pay | Admitting: Radiology

## 2023-08-11 ENCOUNTER — Emergency Department (HOSPITAL_COMMUNITY)

## 2023-08-11 ENCOUNTER — Encounter (HOSPITAL_COMMUNITY): Payer: Self-pay

## 2023-08-11 ENCOUNTER — Observation Stay (HOSPITAL_COMMUNITY)
Admission: EM | Admit: 2023-08-11 | Discharge: 2023-08-12 | Discharge status: Against Medical Advice | Attending: Internal Medicine | Admitting: Internal Medicine

## 2023-08-11 DIAGNOSIS — I110 Hypertensive heart disease with heart failure: Secondary | ICD-10-CM | POA: Insufficient documentation

## 2023-08-11 DIAGNOSIS — R4182 Altered mental status, unspecified: Secondary | ICD-10-CM | POA: Diagnosis present

## 2023-08-11 DIAGNOSIS — R7989 Other specified abnormal findings of blood chemistry: Secondary | ICD-10-CM | POA: Diagnosis not present

## 2023-08-11 DIAGNOSIS — R2689 Other abnormalities of gait and mobility: Secondary | ICD-10-CM | POA: Diagnosis present

## 2023-08-11 DIAGNOSIS — R410 Disorientation, unspecified: Secondary | ICD-10-CM | POA: Diagnosis not present

## 2023-08-11 DIAGNOSIS — F0390 Unspecified dementia without behavioral disturbance: Secondary | ICD-10-CM | POA: Diagnosis present

## 2023-08-11 DIAGNOSIS — F418 Other specified anxiety disorders: Secondary | ICD-10-CM | POA: Insufficient documentation

## 2023-08-11 DIAGNOSIS — G9340 Encephalopathy, unspecified: Secondary | ICD-10-CM | POA: Diagnosis not present

## 2023-08-11 DIAGNOSIS — R531 Weakness: Secondary | ICD-10-CM | POA: Diagnosis not present

## 2023-08-11 DIAGNOSIS — A4902 Methicillin resistant Staphylococcus aureus infection, unspecified site: Secondary | ICD-10-CM | POA: Insufficient documentation

## 2023-08-11 DIAGNOSIS — I5032 Chronic diastolic (congestive) heart failure: Secondary | ICD-10-CM | POA: Diagnosis not present

## 2023-08-11 DIAGNOSIS — I160 Hypertensive urgency: Secondary | ICD-10-CM | POA: Diagnosis not present

## 2023-08-11 DIAGNOSIS — Z79899 Other long term (current) drug therapy: Secondary | ICD-10-CM | POA: Insufficient documentation

## 2023-08-11 DIAGNOSIS — K219 Gastro-esophageal reflux disease without esophagitis: Secondary | ICD-10-CM | POA: Insufficient documentation

## 2023-08-11 LAB — URINALYSIS, W/ REFLEX TO CULTURE (INFECTION SUSPECTED)
Bilirubin Urine: NEGATIVE
Glucose, UA: NEGATIVE mg/dL
Hgb urine dipstick: NEGATIVE
Ketones, ur: 5 mg/dL — AB
Leukocytes,Ua: NEGATIVE
Nitrite: NEGATIVE
Protein, ur: 100 mg/dL — AB
Specific Gravity, Urine: 1.011 (ref 1.005–1.030)
pH: 6 (ref 5.0–8.0)

## 2023-08-11 LAB — CBC WITH DIFFERENTIAL/PLATELET
Abs Immature Granulocytes: 0.02 10*3/uL (ref 0.00–0.07)
Basophils Absolute: 0.1 10*3/uL (ref 0.0–0.1)
Basophils Relative: 1 %
Eosinophils Absolute: 0.1 10*3/uL (ref 0.0–0.5)
Eosinophils Relative: 1 %
HCT: 46.7 % — ABNORMAL HIGH (ref 36.0–46.0)
Hemoglobin: 15.8 g/dL — ABNORMAL HIGH (ref 12.0–15.0)
Immature Granulocytes: 0 %
Lymphocytes Relative: 22 %
Lymphs Abs: 1.8 10*3/uL (ref 0.7–4.0)
MCH: 31.5 pg (ref 26.0–34.0)
MCHC: 33.8 g/dL (ref 30.0–36.0)
MCV: 93.2 fL (ref 80.0–100.0)
Monocytes Absolute: 0.6 10*3/uL (ref 0.1–1.0)
Monocytes Relative: 8 %
Neutro Abs: 5.7 10*3/uL (ref 1.7–7.7)
Neutrophils Relative %: 68 %
Platelets: 183 10*3/uL (ref 150–400)
RBC: 5.01 MIL/uL (ref 3.87–5.11)
RDW: 14.3 % (ref 11.5–15.5)
WBC: 8.3 10*3/uL (ref 4.0–10.5)
nRBC: 0 % (ref 0.0–0.2)

## 2023-08-11 LAB — COMPREHENSIVE METABOLIC PANEL
ALT: 18 U/L (ref 0–44)
AST: 24 U/L (ref 15–41)
Albumin: 4.2 g/dL (ref 3.5–5.0)
Alkaline Phosphatase: 63 U/L (ref 38–126)
Anion gap: 17 — ABNORMAL HIGH (ref 5–15)
BUN: 7 mg/dL — ABNORMAL LOW (ref 8–23)
CO2: 21 mmol/L — ABNORMAL LOW (ref 22–32)
Calcium: 9.5 mg/dL (ref 8.9–10.3)
Chloride: 104 mmol/L (ref 98–111)
Creatinine, Ser: 0.83 mg/dL (ref 0.44–1.00)
GFR, Estimated: >60 mL/min (ref >60)
Glucose, Bld: 109 mg/dL — ABNORMAL HIGH (ref 70–99)
Potassium: 4 mmol/L (ref 3.5–5.1)
Sodium: 142 mmol/L (ref 135–145)
Total Bilirubin: 1.2 mg/dL (ref 0.0–1.2)
Total Protein: 7.4 g/dL (ref 6.5–8.1)

## 2023-08-11 LAB — AMMONIA: Ammonia: 29 umol/L (ref 9–35)

## 2023-08-11 LAB — MAGNESIUM: Magnesium: 2.2 mg/dL (ref 1.7–2.4)

## 2023-08-11 LAB — TROPONIN I (HIGH SENSITIVITY)
Troponin I (High Sensitivity): 33 ng/L — ABNORMAL HIGH (ref <18)
Troponin I (High Sensitivity): 36 ng/L — ABNORMAL HIGH (ref <18)

## 2023-08-11 MED ORDER — ONDANSETRON HCL 4 MG/2ML IJ SOLN
4.0000 mg | Freq: Once | INTRAMUSCULAR | Status: AC
  Administered 2023-08-11: 4 mg via INTRAVENOUS
  Filled 2023-08-11: qty 2

## 2023-08-11 MED ORDER — LACTATED RINGERS IV BOLUS
1000.0000 mL | Freq: Once | INTRAVENOUS | Status: AC
  Administered 2023-08-11: 1000 mL via INTRAVENOUS

## 2023-08-11 MED ORDER — LORAZEPAM 2 MG/ML IJ SOLN
0.5000 mg | Freq: Once | INTRAMUSCULAR | Status: AC
  Administered 2023-08-11: 0.5 mg via INTRAVENOUS
  Filled 2023-08-11: qty 1

## 2023-08-11 NOTE — ED Provider Notes (Signed)
 [facility]ERGENC[facility] DEPARTMENT AT [facility] Provider Note   CSN: [id number] Arrival date & time: 08/11/23  1609     History  Chief Complaint  Patient presents with   Altered Mental Status    Kari Young is a 83 y.o. female.  Patient is an 83 year old female with a history of hypertension, CHF, GERD, overactive bladder who is presenting today with her daughter for several complaints.  Her daughter reports that several weeks ago she started having dysuria frequency and urgency and was placed on an antibiotic.  She reports she was not getting any better with that antibiotic and she went and saw a specialist and they changed her to a different antibiotic approximately 5 days ago.  Her daughter reports that as of 2 days ago the patient has been not herself.  She has been confused, disoriented, difficulty walking.  She reports she walks like she is intoxicated and is stumbling around and somebody has to be there to help her to prevent her from falling.  The patient has fallen 2 times in the last 24 hours when she is try to get up and walk by herself.  She fell once in the hallway and then her husband reported that she fell in the bathtub room into the bathtub.  There was no loss of consciousness that he was aware of.  Patient states she is having an intermittent headache but denies any vomiting.  She does report some nausea and had some dry heaves earlier today but reports that has passed.  She just reports that she feels awful.  No fever, abdominal pain or back pain.  Normally patient is awake and oriented and can ambulate without any assistance.  Her daughter reports that in the past she did this once before when they gave her a antibiotic she did not agree with.  She is unaware what antibiotic she is on right now because she left the medications at the house.  Initially she was on Omnicef but is taking something different now.   Altered Mental Status      Home  Medications Prior to Admission medications   Medication Sig Start Date End Date Taking? Authorizing Provider  amLODipine (NORVASC) 5 MG tablet Take 5 mg by mouth daily.    [provider]  ARIPiprazole (ABILIFY) 5 MG tablet Take 5 mg by mouth daily. 05/23/19   [provider]  Ascorbic Acid (VITAMIN C PO) Take 1 tablet by mouth daily.    [provider]  benztropine (COGENTIN) 1 MG tablet Take by mouth daily. 04/15/22   [provider]  busPIRone (BUSPAR) 10 MG tablet Take 1 tablet (10 mg total) by mouth every 12 (twelve) hours. 05/03/22   Vassie Loll, MD  Cholecalciferol 1000 UNITS tablet Take 1,000 Units by mouth daily.    [provider]  cyanocobalamin (VITAMIN B12) 1000 MCG tablet Take 1 tablet (1,000 mcg total) by mouth in the morning and at bedtime. 05/03/22   Vassie Loll, MD  furosemide (LASIX) 20 MG tablet Take 1 tablet (20 mg total) by mouth daily as needed for fluid or edema. 05/03/22   Vassie Loll, MD  levocetirizine (XYZAL) 5 MG tablet Take 1 tablet by mouth every evening. 04/09/21   [provider]  magnesium oxide (MAG-OX) 400 MG tablet Take 400 mg by mouth daily.     [provider]  pantoprazole (PROTONIX) 40 MG tablet Take 40 mg by mouth daily. 05/23/19   [provider]  polyethylene glycol (MIRALAX / GLYCOLAX) packet Take 17 g by mouth 2 (two) times daily. 01/26/18   Regalado, [name], MD  Probiotic Product (RISA-BID PROBIOTIC) TABS Take 1 tablet by mouth twice a day.    [provider]  senna-docusate (SENOKOT-S) 8.6-50 MG tablet Take 1 tablet by mouth daily. 01/27/18   Regalado, [name], MD  venlafaxine (EFFEXOR-XR) 150 MG 24 hr capsule Take 150 mg by mouth daily.    [provider]  zolpidem (AMBIEN) 10 MG tablet Take 10 mg by mouth at bedtime as needed for sleep.    [provider]      Allergies    Percocet [oxycodone-acetaminophen] and Sulfamethoxazole    Review  of Systems   Review of Systems  Physical Exam Updated Vital Signs BP (!) 201/87   Pulse 81   Temp 97.6 F (36.4 C)   Resp 15   SpO2 99%  Physical Exam Vitals and nursing note reviewed.  Constitutional:      General: She is in acute distress.     Appearance: She is well-developed.  HENT:     Head: Normocephalic and atraumatic.  Eyes:     Pupils: Pupils are equal, round, and reactive to light.  Cardiovascular:     Rate and Rhythm: Normal rate and regular rhythm. Frequent Extrasystoles are present.    Heart sounds: Normal heart sounds. No murmur heard.    No friction rub.  Pulmonary:     Effort: Pulmonary effort is normal.     Breath sounds: Normal breath sounds. No wheezing or rales.  Abdominal:     General: Bowel sounds are normal. There is no distension.     Palpations: Abdomen is soft.     Tenderness: There is no abdominal tenderness. There is no right CVA tenderness, left CVA tenderness, guarding or rebound.  Musculoskeletal:        General: No tenderness. Normal range of motion.     Cervical back: Normal range of motion and neck supple. No tenderness.     Right lower leg: No edema.     Left lower leg: No edema.     Comments: No edema  Skin:    General: Skin is warm and dry.     Findings: No rash.  Neurological:     Mental Status: She is alert.     Cranial Nerves: No cranial nerve deficit or facial asymmetry.     Sensory: Sensation is intact.     Motor: Motor function is intact. No weakness or pronator drift.     Coordination: Finger-Nose-Finger Test and Heel to [employer] normal.     Comments: Slightly confused  Psychiatric:        Behavior: Behavior normal.     ED Results / Procedures / Treatments   Labs (all labs ordered are listed, but only abnormal results are displayed) Labs Reviewed  CBC WITH DIFFERENTIAL/PLATELET - Abnormal; Notable for the following components:      Result Value   Hemoglobin 15.8 (*)    HCT 46.7 (*)    All other components within  normal limits  COMPREHENSIVE METABOLIC PANEL - Abnormal; Notable for the following components:   CO2 21 (*)    Glucose, Bld 109 (*)    BUN 7 (*)    Anion gap 17 (*)    All other components within normal limits  URINALYSIS, W/ REFLEX TO CULTURE (INFECTION SUSPECTED) - Abnormal; Notable for the following components:   Ketones, ur 5 (*)  Protein, ur 100 (*)    Bacteria, UA RARE (*)    All other components within normal limits  TROPONIN I (HIGH SENSITIVITY) - Abnormal; Notable for the following components:   Troponin I (High Sensitivity) 33 (*)    All other components within normal limits  TROPONIN I (HIGH SENSITIVITY) - Abnormal; Notable for the following components:   Troponin I (High Sensitivity) 36 (*)    All other components within normal limits  URINE CULTURE  MAGNESIUM  AMMONIA    EKG EKG Interpretation Date/Time:  Tuesday August 11 2023 16:52:30 EST Ventricular Rate:  82 PR Interval:  175 QRS Duration:  100 QT Interval:  395 QTC Calculation: 462 R Axis:   -11  Text Interpretation: Sinus rhythm new Ventricular premature complex LVH with secondary repolarization abnormality otherwise no acute changes Confirmed by Gwyneth Sprout ([id number]) on 08/11/2023 5:23:40 PM  Radiology MR BRAIN WO CONTRAST Result Date: 08/11/2023 CLINICAL DATA:  Mental status change, unknown cause EXAM: MRI HEAD WITHOUT CONTRAST TECHNIQUE: Multiplanar, multiecho pulse sequences of the brain and surrounding structures were obtained without intravenous contrast. COMPARISON:  Same day CT head. FINDINGS: Brain: No acute infarction, hemorrhage, hydrocephalus, extra-axial collection or mass lesion. Advanced patchy and confluent T2/FLAIR hyperintensities in the white matter are compatible with chronic microvascular ischemic disease. Vascular: Major arterial flow voids are maintained at the skull base. Skull and upper cervical spine: Normal marrow signal. Sinuses/Orbits: Clear sinuses.  No acute orbital findings.  Other: No mastoid effusions. IMPRESSION: No evidence of acute intracranial abnormality. Electronically Signed   By: Feliberto Harts M.D.   On: 08/11/2023 22:48   CT Head Wo Contrast Result Date: 08/11/2023 CLINICAL DATA:  Altered mental status and dizziness, initial encounter EXAM: CT HEAD WITHOUT CONTRAST TECHNIQUE: Contiguous axial images were obtained from the base of the skull through the vertex without intravenous contrast. RADIATION DOSE REDUCTION: This exam was performed according to the departmental dose-optimization program which includes automated exposure control, adjustment of the mA and/or kV according to patient size and/or use of iterative reconstruction technique. COMPARISON:  05/01/2022 FINDINGS: Brain: No evidence of acute infarction, hemorrhage, hydrocephalus, extra-axial collection or mass lesion/mass effect. Chronic atrophic and ischemic changes are noted similar to that 4 seen on the prior exam. Vascular: No hyperdense vessel or unexpected calcification. Skull: Normal. Negative for fracture or focal lesion. Sinuses/Orbits: No acute finding. Other: None. IMPRESSION: Chronic atrophic and ischemic changes without acute abnormality. Electronically Signed   By: Alcide Clever M.D.   On: 08/11/2023 19:46   DG Hand Complete Left Result Date: 08/11/2023 CLINICAL DATA:  Recent fall with hand pain, initial encounter EXAM: LEFT HAND - COMPLETE 3+ VIEW COMPARISON:  11/10/2009 FINDINGS: Changes of prior surgical fixation of the distal radius are noted. 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 No acute fracture or dislocation is noted. No soft tissue abnormality is noted. IMPRESSION: Chronic changes without acute abnormality. Electronically Signed   By: Alcide Clever M.D.   On: 08/11/2023 19:45   DG Chest Port 1 View Result Date: 08/11/2023 CLINICAL DATA:  Confusion EXAM: PORTABLE CHEST 1 VIEW COMPARISON:  01/25/2018 FINDINGS: Cardiac shadow is stable. Lungs are well aerated bilaterally. No focal  infiltrate or effusion is seen. No bony abnormality is noted. IMPRESSION: No acute abnormality seen. Electronically Signed   By: Alcide Clever M.D.   On: 08/11/2023 19:44    Procedures Procedures    Medications Ordered in ED Medications  lactated ringers bolus 1,000 mL (0 mLs Intravenous Stopped 08/11/23 2039)  ondansetron [facility]) injection 4 mg (4 mg Intravenous Given 08/11/23 1750)  LORazepam (ATIVAN) injection 0.5 mg (0.5 mg Intravenous Given 08/11/23 2050)    ED Course/ Medical Decision Making/ A&P                                 Medical Decision Making Amount and/or Complexity of Data Reviewed External Data Reviewed: notes. Labs: ordered. Decision-making details documented in ED Course. Radiology: ordered and independent interpretation performed. Decision-making details documented in ED Course. ECG/medicine tests: ordered and independent interpretation performed. Decision-making details documented in ED Course.  Risk Prescription drug management.   Pt with multiple medical problems and comorbidities and presenting today with a complaint that caries a high risk for morbidity and mortality.  Here today with the above complaints.  Concern for stroke, metabolic encephalopathy from possible ongoing bladder infection versus encephalopathy from medication interaction, head bleed, AKI.  Patient is awake and alert at this time.  Initially it was reported that she had a low pulse however when she was placed on continuous monitoring which I independently interpreted patient does have a sinus rhythm with frequent PVCs.  Rates are between 70-90.  I independently interpreted her EKG which shows a sinus rhythm with frequent PVCs but no other acute concerns.  Patient given IV fluids and antiemetics.  Labs and imaging pending.  10:53 PM Independently interpreted patient's labs and EKG.  CBC, CMP, UA all without acute findings.  Troponin mildly elevated at 33, magnesium within normal limits.  I have  independently visualized and interpreted pt's images today.  Head CT without evidence of acute bleed.  Radiology reports chronic atrophic and neck skin changes, chest x-ray and hand films are negative for acute findings.  On reevaluation given patient's encephalopathy and balance issues concerned that it could still be related to medication but also concern concern for possible posterior stroke.  MRI ordered. 10:53 PM MRI is neg.  Suspect encephalopathy is from abx although unclear what the abx is.  However given ongoing confusion and ataxia will admit for observation. Discussed findings with pt and her daughter who are comfortable with this plan.          Final Clinical Impression(s) / ED Diagnoses Final diagnoses:  Encephalopathy acute    Rx / DC Orders ED Discharge Orders     None         Gwyneth Sprout, MD 08/11/23 2253

## 2023-08-11 NOTE — ED Notes (Signed)
 Pt taken to MRI

## 2023-08-11 NOTE — ED Notes (Signed)
 Patient transported to CT

## 2023-08-11 NOTE — ED Triage Notes (Signed)
 Pt is coming in for dizziness, altered mental status, gait disturbances, and 2 reported fall that has all come after being recent diagnosed with a UTI. She was placed on 2 different abx for the UTI but they do not seem to be working. She is brady in triage with a HR of 34 in triage.

## 2023-08-12 ENCOUNTER — Encounter (HOSPITAL_COMMUNITY): Payer: Self-pay | Admitting: Internal Medicine

## 2023-08-12 DIAGNOSIS — K219 Gastro-esophageal reflux disease without esophagitis: Secondary | ICD-10-CM | POA: Insufficient documentation

## 2023-08-12 DIAGNOSIS — F418 Other specified anxiety disorders: Secondary | ICD-10-CM | POA: Insufficient documentation

## 2023-08-12 DIAGNOSIS — I160 Hypertensive urgency: Secondary | ICD-10-CM | POA: Diagnosis not present

## 2023-08-12 DIAGNOSIS — R7989 Other specified abnormal findings of blood chemistry: Secondary | ICD-10-CM | POA: Insufficient documentation

## 2023-08-12 DIAGNOSIS — R531 Weakness: Secondary | ICD-10-CM

## 2023-08-12 DIAGNOSIS — R410 Disorientation, unspecified: Secondary | ICD-10-CM

## 2023-08-12 LAB — CBC WITH DIFFERENTIAL/PLATELET
Abs Immature Granulocytes: 0.02 10*3/uL (ref 0.00–0.07)
Basophils Absolute: 0.1 10*3/uL (ref 0.0–0.1)
Basophils Relative: 1 %
Eosinophils Absolute: 0.1 10*3/uL (ref 0.0–0.5)
Eosinophils Relative: 2 %
HCT: 42.7 % (ref 36.0–46.0)
Hemoglobin: 14.4 g/dL (ref 12.0–15.0)
Immature Granulocytes: 0 %
Lymphocytes Relative: 29 %
Lymphs Abs: 1.9 10*3/uL (ref 0.7–4.0)
MCH: 31.6 pg (ref 26.0–34.0)
MCHC: 33.7 g/dL (ref 30.0–36.0)
MCV: 93.8 fL (ref 80.0–100.0)
Monocytes Absolute: 0.5 10*3/uL (ref 0.1–1.0)
Monocytes Relative: 7 %
Neutro Abs: 4 10*3/uL (ref 1.7–7.7)
Neutrophils Relative %: 61 %
Platelets: 140 10*3/uL — ABNORMAL LOW (ref 150–400)
RBC: 4.55 MIL/uL (ref 3.87–5.11)
RDW: 14.2 % (ref 11.5–15.5)
WBC: 6.5 10*3/uL (ref 4.0–10.5)
nRBC: 0 % (ref 0.0–0.2)

## 2023-08-12 LAB — URINE CULTURE: Culture: <10000 — AB

## 2023-08-12 LAB — COMPREHENSIVE METABOLIC PANEL
ALT: 15 U/L (ref 0–44)
AST: 17 U/L (ref 15–41)
Albumin: 3.5 g/dL (ref 3.5–5.0)
Alkaline Phosphatase: 53 U/L (ref 38–126)
Anion gap: 12 (ref 5–15)
BUN: 7 mg/dL — ABNORMAL LOW (ref 8–23)
CO2: 27 mmol/L (ref 22–32)
Calcium: 9 mg/dL (ref 8.9–10.3)
Chloride: 103 mmol/L (ref 98–111)
Creatinine, Ser: 0.81 mg/dL (ref 0.44–1.00)
GFR, Estimated: >60 mL/min (ref >60)
Glucose, Bld: 122 mg/dL — ABNORMAL HIGH (ref 70–99)
Potassium: 3.1 mmol/L — ABNORMAL LOW (ref 3.5–5.1)
Sodium: 142 mmol/L (ref 135–145)
Total Bilirubin: 1 mg/dL (ref 0.0–1.2)
Total Protein: 6.4 g/dL — ABNORMAL LOW (ref 6.5–8.1)

## 2023-08-12 LAB — CK: Total CK: 135 U/L (ref 38–234)

## 2023-08-12 LAB — TSH: TSH: 2.473 u[IU]/mL (ref 0.350–4.500)

## 2023-08-12 MED ORDER — ZOLPIDEM TARTRATE 5 MG PO TABS: 5.0000 mg | ORAL_TABLET | Freq: Every evening | ORAL | Status: DC | PRN

## 2023-08-12 MED ORDER — BENZTROPINE MESYLATE 1 MG PO TABS: 1.0000 mg | ORAL_TABLET | Freq: Every day | ORAL | Status: DC

## 2023-08-12 MED ORDER — BUSPIRONE HCL 10 MG PO TABS
10.0000 mg | ORAL_TABLET | Freq: Two times a day (BID) | ORAL | Status: DC
  Administered 2023-08-12: 10 mg via ORAL
  Filled 2023-08-12: qty 1

## 2023-08-12 MED ORDER — ARIPIPRAZOLE 10 MG PO TABS
5.0000 mg | ORAL_TABLET | Freq: Every day | ORAL | Status: DC
  Administered 2023-08-12: 5 mg via ORAL
  Filled 2023-08-12: qty 1

## 2023-08-12 MED ORDER — LORATADINE 10 MG PO TABS: 10.0000 mg | ORAL_TABLET | Freq: Every evening | ORAL | Status: DC

## 2023-08-12 MED ORDER — POTASSIUM CHLORIDE CRYS ER 20 MEQ PO TBCR
40.0000 meq | EXTENDED_RELEASE_TABLET | Freq: Once | ORAL | Status: AC
  Administered 2023-08-12: 40 meq via ORAL
  Filled 2023-08-12: qty 2

## 2023-08-12 MED ORDER — PANTOPRAZOLE SODIUM 40 MG PO TBEC
40.0000 mg | DELAYED_RELEASE_TABLET | Freq: Every day | ORAL | Status: DC
  Administered 2023-08-12: 40 mg via ORAL
  Filled 2023-08-12: qty 1

## 2023-08-12 MED ORDER — ENOXAPARIN SODIUM 40 MG/0.4ML IJ SOSY
40.0000 mg | PREFILLED_SYRINGE | INTRAMUSCULAR | Status: DC
  Administered 2023-08-12: 40 mg via SUBCUTANEOUS
  Filled 2023-08-12: qty 0.4

## 2023-08-12 MED ORDER — MAGNESIUM OXIDE -MG SUPPLEMENT 400 (240 MG) MG PO TABS
400.0000 mg | ORAL_TABLET | Freq: Every day | ORAL | Status: DC
  Administered 2023-08-12: 400 mg via ORAL
  Filled 2023-08-12: qty 1

## 2023-08-12 MED ORDER — VENLAFAXINE HCL ER 75 MG PO CP24
150.0000 mg | ORAL_CAPSULE | Freq: Every day | ORAL | Status: DC
  Administered 2023-08-12: 150 mg via ORAL
  Filled 2023-08-12: qty 2

## 2023-08-12 MED ORDER — AMLODIPINE BESYLATE 5 MG PO TABS
10.0000 mg | ORAL_TABLET | Freq: Every day | ORAL | Status: DC
  Administered 2023-08-12: 10 mg via ORAL
  Filled 2023-08-12: qty 2

## 2023-08-12 MED ORDER — VITAMIN B-12 1000 MCG PO TABS
1000.0000 ug | ORAL_TABLET | Freq: Every day | ORAL | Status: DC
  Administered 2023-08-12: 1000 ug via ORAL
  Filled 2023-08-12: qty 1

## 2023-08-12 NOTE — ED Notes (Signed)
 Patient left with family member prior to receiving discharge papers.

## 2023-08-12 NOTE — H&P (Signed)
 History and Physical    Kari Young ZOX:[id number] DOB: 1941/01/02 DOA: 08/11/2023  Patient coming from: Home.  Chief Complaint: Confusion.  HPI: Kari Young is a 83 y.o. female with history of hypertension, CHF, GERD, anxiety, depression was brought to the ER after patient's family found that patient was confused and not herself for the last 2 days.  Patient was treated for urinary tract infection with Omnicef on July 28, 2023 and since patient still had symptoms another course was given.  Last 2 days patient was found to be increasingly confused and weak and had at least 2-3 falls and posterior extremities.  Did not lose consciousness.  Did not have any chest pain shortness of breath nausea vomiting or diarrhea.  In October 2023 patient was admitted for altered mental status at that time it was felt to be secondary to polypharmacy.  Per patient's family other than Omnicef patient has not been on any new medications.   ED Course: In the ER patient appeared generally weak nonfocal.  CT head was unremarkable.  MRI brain did not show any stroke.  EKG shows normal sinus rhythm.  Initial blood pressure was in the 200s systolic.  Patient was given fluid bolus and admitted for further observation.  Review of Systems: As per HPI, rest all negative.   Past Medical History:  Diagnosis Date   Anxiety    CHF (congestive heart failure) (HCC)    Closed right ankle fracture    Depression    GERD (gastroesophageal reflux disease)    Hypertension    MRSA infection    OAB (overactive bladder)     Past Surgical History:  Procedure Laterality Date   ABDOMINAL HYSTERECTOMY     APPENDECTOMY     NASAL SINUS SURGERY     ORIF ANKLE FRACTURE Right 06/20/2019   Procedure: OPEN REDUCTION INTERNAL FIXATION (ORIF) TRIMALLEOLAR ANKLE FRACTURE;  Surgeon: Terance Hart, MD;  Location: [facility];  Service: Orthopedics;  Laterality: Right;  block in preop     reports that  she has never smoked. She has never used smokeless tobacco. She reports that she does not drink alcohol and does not use drugs.  Allergies  Allergen Reactions   Percocet [Oxycodone-Acetaminophen] Itching   Sulfamethoxazole Nausea Only    Family History  Problem Relation Age of Onset   Sudden Cardiac Death Neg Hx     Prior to Admission medications   Medication Sig Start Date End Date Taking? Authorizing Provider  amLODipine (NORVASC) 10 MG tablet Take 10 mg by mouth daily.   Yes [provider]  ARIPiprazole (ABILIFY) 5 MG tablet Take 5 mg by mouth daily. 05/23/19  Yes [provider]  benztropine (COGENTIN) 1 MG tablet Take 1 mg by mouth daily. 04/15/22  Yes [provider]  busPIRone (BUSPAR) 10 MG tablet Take 1 tablet (10 mg total) by mouth every 12 (twelve) hours. 05/03/22  Yes Vassie Loll, MD  cyanocobalamin (VITAMIN B12) 1000 MCG tablet Take 1 tablet (1,000 mcg total) by mouth in the morning and at bedtime. Patient taking differently: Take 1,000 mcg by mouth daily. 05/03/22  Yes Vassie Loll, MD  estradiol (ESTRACE) 0.1 MG/GM vaginal cream Fingertip massage per vagina, nightly for two weeks, then 2 X weekly thereafter 07/28/23  Yes [provider]  furosemide (LASIX) 20 MG tablet Take 1 tablet (20 mg total) by mouth daily as needed for fluid or edema. Patient taking differently: Take 20 mg by mouth daily. 05/03/22  Yes Vassie Loll, MD  GEMTESA 75 MG TABS Take 75 mg by mouth daily. 07/28/23  Yes [provider]  levocetirizine (XYZAL) 5 MG tablet Take 1 tablet by mouth every evening. 04/09/21  Yes [provider]  magnesium oxide (MAG-OX) 400 MG tablet Take 400 mg by mouth daily.    Yes [provider]  pantoprazole (PROTONIX) 40 MG tablet Take 40 mg by mouth daily. 05/23/19  Yes [provider]  Probiotic Product (RISA-BID PROBIOTIC) TABS Take 1 tablet by mouth twice a day.   Yes [provider]   venlafaxine (EFFEXOR-XR) 150 MG 24 hr capsule Take 150 mg by mouth daily.   Yes [provider]  zolpidem (AMBIEN) 10 MG tablet Take 10 mg by mouth at bedtime as needed for sleep.   Yes [provider]    Physical Exam: Constitutional: Moderately built and nourished. Vitals:   08/11/23 1900 08/11/23 2030 08/11/23 2051 08/12/23 0140  BP: (!) 183/105 (!) 201/87  (!) 146/82  Pulse: 85 81  78  Resp: 17 15  16   Temp:   97.6 F (36.4 C) 97.9 F (36.6 C)  SpO2: 99% 99%  98%   Eyes: Anicteric no pallor. ENMT: No discharge from the ears eyes nose or mouth. Neck: No mass felt.  No neck rigidity. Respiratory: No rhonchi or crepitations. Cardiovascular: S1-S2 heard. Abdomen: Soft nontender bowel sounds present. Musculoskeletal: No edema. Skin: Extremities have few bruises. Neurologic: Alert awake oriented to time place and person.  Moves all extremities 5 x 5.  No facial asymmetry tongue is midline and pupils are equal and reacting to light. Psychiatric: Appears normal.  Normal affect.   Labs on Admission: I have personally reviewed following labs and imaging studies  CBC: Recent Labs  Lab 08/11/23 1702  WBC 8.3  NEUTROABS 5.7  HGB 15.8*  HCT 46.7*  MCV 93.2  PLT 183   Basic Metabolic Panel: Recent Labs  Lab 08/11/23 1702  NA 142  K 4.0  CL 104  CO2 21*  GLUCOSE 109*  BUN 7*  CREATININE 0.83  CALCIUM 9.5  MG 2.2   GFR: CrCl cannot be calculated (Unknown ideal weight.). Liver Function Tests: Recent Labs  Lab 08/11/23 1702  AST 24  ALT 18  ALKPHOS 63  BILITOT 1.2  PROT 7.4  ALBUMIN 4.2   No results for input(s): "LIPASE", "AMYLASE" in the last 168 hours. Recent Labs  Lab 08/11/23 2050  AMMONIA 29   Coagulation Profile: No results for input(s): "INR", "PROTIME" in the last 168 hours. Cardiac Enzymes: No results for input(s): "CKTOTAL", "CKMB", "CKMBINDEX", "TROPONINI" in the last 168 hours. BNP (last 3 results) No results for  input(s): "PROBNP" in the last 8760 hours. HbA1C: No results for input(s): "HGBA1C" in the last 72 hours. CBG: No results for input(s): "GLUCAP" in the last 168 hours. Lipid Profile: No results for input(s): "CHOL", "HDL", "LDLCALC", "TRIG", "CHOLHDL", "LDLDIRECT" in the last 72 hours. Thyroid Function Tests: No results for input(s): "TSH", "T4TOTAL", "FREET4", "T3FREE", "THYROIDAB" in the last 72 hours. Anemia Panel: No results for input(s): "VITAMINB12", "FOLATE", "FERRITIN", "TIBC", "IRON", "RETICCTPCT" in the last 72 hours. Urine analysis:    Component Value Date/Time   COLORURINE YELLOW 08/11/2023 1702   APPEARANCEUR CLEAR 08/11/2023 1702   LABSPEC 1.011 08/11/2023 1702   PHURINE 6.0 08/11/2023 1702   GLUCOSEU NEGATIVE 08/11/2023 1702   HGBUR NEGATIVE 08/11/2023 1702   BILIRUBINUR NEGATIVE 08/11/2023 1702   KETONESUR 5 (A) 08/11/2023 1702   PROTEINUR  100 (A) 08/11/2023 1702   UROBILINOGEN 0.2 08/25/2007 1851   NITRITE NEGATIVE 08/11/2023 1702   LEUKOCYTESUR NEGATIVE 08/11/2023 1702   Sepsis Labs: @LABRCNTIP (procalcitonin:4,lacticidven:4) )No results found for this or any previous visit (from the past 240 hours).   Radiological Exams on Admission: MR BRAIN WO CONTRAST Result Date: 08/11/2023 CLINICAL DATA:  Mental status change, unknown cause EXAM: MRI HEAD WITHOUT CONTRAST TECHNIQUE: Multiplanar, multiecho pulse sequences of the brain and surrounding structures were obtained without intravenous contrast. COMPARISON:  Same day CT head. FINDINGS: Brain: No acute infarction, hemorrhage, hydrocephalus, extra-axial collection or mass lesion. Advanced patchy and confluent T2/FLAIR hyperintensities in the white matter are compatible with chronic microvascular ischemic disease. Vascular: Major arterial flow voids are maintained at the skull base. Skull and upper cervical spine: Normal marrow signal. Sinuses/Orbits: Clear sinuses.  No acute orbital findings. Other: No mastoid effusions.  IMPRESSION: No evidence of acute intracranial abnormality. Electronically Signed   By: Feliberto Harts M.D.   On: 08/11/2023 22:48   CT Head Wo Contrast Result Date: 08/11/2023 CLINICAL DATA:  Altered mental status and dizziness, initial encounter EXAM: CT HEAD WITHOUT CONTRAST TECHNIQUE: Contiguous axial images were obtained from the base of the skull through the vertex without intravenous contrast. RADIATION DOSE REDUCTION: This exam was performed according to the departmental dose-optimization program which includes automated exposure control, adjustment of the mA and/or kV according to patient size and/or use of iterative reconstruction technique. COMPARISON:  05/01/2022 FINDINGS: Brain: No evidence of acute infarction, hemorrhage, hydrocephalus, extra-axial collection or mass lesion/mass effect. Chronic atrophic and ischemic changes are noted similar to that 4 seen on the prior exam. Vascular: No hyperdense vessel or unexpected calcification. Skull: Normal. Negative for fracture or focal lesion. Sinuses/Orbits: No acute finding. Other: None. IMPRESSION: Chronic atrophic and ischemic changes without acute abnormality. Electronically Signed   By: Alcide Clever M.D.   On: 08/11/2023 19:46   DG Hand Complete Left Result Date: 08/11/2023 CLINICAL DATA:  Recent fall with hand pain, initial encounter EXAM: LEFT HAND - COMPLETE 3+ VIEW COMPARISON:  11/10/2009 FINDINGS: Changes of prior surgical fixation of the distal radius are noted. 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 No acute fracture or dislocation is noted. No soft tissue abnormality is noted. IMPRESSION: Chronic changes without acute abnormality. Electronically Signed   By: Alcide Clever M.D.   On: 08/11/2023 19:45   DG Chest Port 1 View Result Date: 08/11/2023 CLINICAL DATA:  Confusion EXAM: PORTABLE CHEST 1 VIEW COMPARISON:  01/25/2018 FINDINGS: Cardiac shadow is stable. Lungs are well aerated bilaterally. No focal infiltrate or effusion is  seen. No bony abnormality is noted. IMPRESSION: No acute abnormality seen. Electronically Signed   By: Alcide Clever M.D.   On: 08/11/2023 19:44    EKG: Independently reviewed.  Normal sinus rhythm.  Assessment/Plan Principal Problem:   Acute delirium Active Problems:   Chronic diastolic CHF (congestive heart failure) (HCC)   Dementia without behavioral disturbance (HCC)   Hypertensive urgency   GERD (gastroesophageal reflux disease)   Depression with anxiety   Weakness    Acute delirium/acute encephalopathy -    cause not clear.  Patient is afebrile.  Patient was given fluid bolus in the ER MRI brain was unremarkable.  Ammonia was 29.  After fluid bolus was given per patient's daughter patient has become more alert and more back to baseline.  Will continue to monitor.  Not sure if confusion was related to her recent antibiotic intake. Generalized weakness patient states she is  feeling very weak and fatigue over the last few days.  Will check TSH CK levels get physical therapy consult. Hypertensive urgency -    may be contributing to her symptoms.  Patient states she has been compliant with the amlodipine.  Will follow blood pressure trends.  May need to add additional medications if blood pressure remains elevated. History of diastolic CHF appears compensated.  Holding Lasix for now since patient is feeling generally weak. Elevated troponin but remains flat.  Denies chest pain.  Will check 2D echo. Depression and anxiety on Abilify Effexor BuSpar Cogentin. Cognitive impairment as per the primary care's notes.  Since patient has acute delirium/encephalopathy with hypertensive urgency will need close monitoring and more than 2 midnight stay.   DVT prophylaxis: Lovenox. Code Status: Full code. Family Communication: Patient's daughter. Disposition Plan: Monitored bed. Consults called: Physical therapy. Admission status: Observation.

## 2023-08-12 NOTE — ED Notes (Signed)
 Pt did not want breakfast tray.  Pt given graham crackers, peanut butter, and Sprite per request.

## 2023-08-12 NOTE — Discharge Summary (Signed)
 Physician Discharge Summary   Patient: Kari Young MRN: [id number] DOB: 07-18-1940  Admit date:     08/11/2023  Discharge date: 08/12/23  Discharge Physician: Alba Cory   PCP: [facility]   Recommendations at discharge:    Patient left AMA, I was not able to discuss ECHO results.  FU Urine culture  Discharge Diagnoses: Principal Problem:   Acute delirium Active Problems:   Chronic diastolic CHF (congestive heart failure) (HCC)   Dementia without behavioral disturbance (HCC)   Hypertensive urgency   GERD (gastroesophageal reflux disease)   Depression with anxiety   Weakness   Elevated troponin  Resolved Problems:   * No resolved hospital problems. *  Hospital Course:  Kari Young is a 83 y.o. female with history of hypertension, CHF, GERD, anxiety, depression was brought to the ER after patient's family found that patient was confused and not herself for the last 2 days.  Patient was treated for urinary tract infection with Omnicef on July 28, 2023 and since patient still had symptoms another course was given.  Last 2 days patient was found to be increasingly confused and weak and had at least 2-3 falls and posterior extremities.  Did not lose consciousness.  Did not have any chest pain shortness of breath nausea vomiting or diarrhea.   In October 2023 patient was admitted for altered mental status at that time it was felt to be secondary to polypharmacy.   Per patient's family other than Omnicef patient has not been on any new medications.    ED Course: In the ER patient appeared generally weak nonfocal.  CT head was unremarkable.  MRI brain did not show any stroke.  EKG shows normal sinus rhythm.  Initial blood pressure was in the 200s systolic.  Patient was given fluid bolus and admitted for further observation.  Assessment and Plan: Acute delirium encephalopathy: MRI was unremarkable ammonia 29, patient improved with  hydration.  Generalized weakness, TSH normal.  PT consulted. Pretense of urgency patient amlodipine was resumed  History of diastolic heart failure Lasix was held due to generalized weakness and poor oral intake Troponin  flat echo not done, patient left AMA>  Patient anxiety Abilify Effexor buspirone Cogentin resumed  Family took patient home, prior to my final  decision in regards discharge.         Consultants: None  Disposition: Home Diet recommendation:  Cardiac diet DISCHARGE MEDICATION: Allergies as of 08/12/2023       Reactions   Percocet [oxycodone-acetaminophen] Itching   Sulfamethoxazole Nausea Only        Medication List     ASK your doctor about these medications    amLODipine 10 MG tablet Commonly known as: NORVASC Take 10 mg by mouth daily. Ask about: Which instructions should I use?   ARIPiprazole 5 MG tablet Commonly known as: ABILIFY Take 5 mg by mouth daily.   benztropine 1 MG tablet Commonly known as: COGENTIN Take 1 mg by mouth daily.   busPIRone 10 MG tablet Commonly known as: BUSPAR Take 1 tablet (10 mg total) by mouth every 12 (twelve) hours.   cyanocobalamin 1000 MCG tablet Commonly known as: VITAMIN B12 Take 1 tablet (1,000 mcg total) by mouth in the morning and at bedtime.   estradiol 0.1 MG/GM vaginal cream Commonly known as: ESTRACE Fingertip massage per vagina, nightly for two weeks, then 2 X weekly thereafter   furosemide 20 MG tablet Commonly known as: LASIX Take 1 tablet (  20 mg total) by mouth daily as needed for fluid or edema.   Gemtesa 75 MG Tabs Generic drug: Vibegron Take 75 mg by mouth daily.   levocetirizine 5 MG tablet Commonly known as: XYZAL Take 1 tablet by mouth every evening.   magnesium oxide 400 MG tablet Commonly known as: MAG-OX Take 400 mg by mouth daily.   pantoprazole 40 MG tablet Commonly known as: PROTONIX Take 40 mg by mouth daily.   Risa-Bid Probiotic Tabs Take 1 tablet by mouth  twice a day.   venlafaxine XR 150 MG 24 hr capsule Commonly known as: EFFEXOR-XR Take 150 mg by mouth daily.   zolpidem 10 MG tablet Commonly known as: AMBIEN Take 10 mg by mouth at bedtime as needed for sleep.        Discharge Exam: Filed Weights   08/12/23 0300  Weight: 85 kg     Condition at discharge:  left AMA  The results of significant diagnostics from this hospitalization (including imaging, microbiology, ancillary and laboratory) are listed below for reference.   Imaging Studies: MR BRAIN WO CONTRAST Result Date: 08/11/2023 CLINICAL DATA:  Mental status change, unknown cause EXAM: MRI HEAD WITHOUT CONTRAST TECHNIQUE: Multiplanar, multiecho pulse sequences of the brain and surrounding structures were obtained without intravenous contrast. COMPARISON:  Same day CT head. FINDINGS: Brain: No acute infarction, hemorrhage, hydrocephalus, extra-axial collection or mass lesion. Advanced patchy and confluent T2/FLAIR hyperintensities in the white matter are compatible with chronic microvascular ischemic disease. Vascular: Major arterial flow voids are maintained at the skull base. Skull and upper cervical spine: Normal marrow signal. Sinuses/Orbits: Clear sinuses.  No acute orbital findings. Other: No mastoid effusions. IMPRESSION: No evidence of acute intracranial abnormality. Electronically Signed   By: Feliberto Harts M.D.   On: 08/11/2023 22:48   CT Head Wo Contrast Result Date: 08/11/2023 CLINICAL DATA:  Altered mental status and dizziness, initial encounter EXAM: CT HEAD WITHOUT CONTRAST TECHNIQUE: Contiguous axial images were obtained from the base of the skull through the vertex without intravenous contrast. RADIATION DOSE REDUCTION: This exam was performed according to the departmental dose-optimization program which includes automated exposure control, adjustment of the mA and/or kV according to patient size and/or use of iterative reconstruction technique. COMPARISON:   05/01/2022 FINDINGS: Brain: No evidence of acute infarction, hemorrhage, hydrocephalus, extra-axial collection or mass lesion/mass effect. Chronic atrophic and ischemic changes are noted similar to that 4 seen on the prior exam. Vascular: No hyperdense vessel or unexpected calcification. Skull: Normal. Negative for fracture or focal lesion. Sinuses/Orbits: No acute finding. Other: None. IMPRESSION: Chronic atrophic and ischemic changes without acute abnormality. Electronically Signed   By: Alcide Clever M.D.   On: 08/11/2023 19:46   DG Hand Complete Left Result Date: 08/11/2023 CLINICAL DATA:  Recent fall with hand pain, initial encounter EXAM: LEFT HAND - COMPLETE 3+ VIEW COMPARISON:  11/10/2009 FINDINGS: Changes of prior surgical fixation of the distal radius are noted. 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 No acute fracture or dislocation is noted. No soft tissue abnormality is noted. IMPRESSION: Chronic changes without acute abnormality. Electronically Signed   By: Alcide Clever M.D.   On: 08/11/2023 19:45   DG Chest Port 1 View Result Date: 08/11/2023 CLINICAL DATA:  Confusion EXAM: PORTABLE CHEST 1 VIEW COMPARISON:  01/25/2018 FINDINGS: Cardiac shadow is stable. Lungs are well aerated bilaterally. No focal infiltrate or effusion is seen. No bony abnormality is noted. IMPRESSION: No acute abnormality seen. Electronically Signed   By: Alcide Clever  M.D.   On: 08/11/2023 19:44    Microbiology: Results for orders placed or performed during the hospital encounter of 08/11/23  Urine Culture     Status: Abnormal   Collection Time: 08/11/23  5:02 PM   Specimen: Urine, Random  Result Value Ref Range Status   Specimen Description URINE, RANDOM  Final   Special Requests NONE Reflexed from [id number]  Final   Culture (A)  Final    <10,000 COLONIES/mL INSIGNIFICANT GROWTH Performed at [facility], [address] [place], [place] [address]    Report Status 08/12/2023 FINAL  Final     Labs: CBC: Recent Labs  Lab 08/11/23 1702 08/12/23 0507  WBC 8.3 6.5  NEUTROABS 5.7 4.0  HGB 15.8* 14.4  HCT 46.7* 42.7  MCV 93.2 93.8  PLT 183 140*   Basic Metabolic Panel: Recent Labs  Lab 08/11/23 1702 08/12/23 0507  NA 142 142  K 4.0 3.1*  CL 104 103  CO2 21* 27  GLUCOSE 109* 122*  BUN 7* 7*  CREATININE 0.83 0.81  CALCIUM 9.5 9.0  MG 2.2  --    Liver Function Tests: Recent Labs  Lab 08/11/23 1702 08/12/23 0507  AST 24 17  ALT 18 15  ALKPHOS 63 53  BILITOT 1.2 1.0  PROT 7.4 6.4*  ALBUMIN 4.2 3.5   CBG: No results for input(s): "GLUCAP" in the last 168 hours.  Discharge time spent: greater than 30 minutes.  Signed: Alba Cory, MD [facility] 08/12/2023

## 2023-08-12 NOTE — ED Notes (Signed)
 Hospitalist at bedside

## 2023-08-12 NOTE — Evaluation (Signed)
 Occupational Therapy Evaluation and Discharge Summary Patient Details Name: Kari Young MRN: [id number] DOB: Dec 12, 1940 Today's Date: 08/12/2023   History of Present Illness   Pt is an 83 yo female admitted with confusion.  MRI/CT -. PMH: HTN, CHF, GED, anxiety and depression.  10/23 pt with confusion due to polypharmacy. Pt currenlty being treated for UTI since mid Feb.     Clinical Impressions Pt admitted with the above diagnosis and has the deficits outlined below. Overall pt is at or very close to baseline with all basic adls. Pt was not oriented to the year but did recall it was 2025 with 10 min delay. Pt answered all other safety questions without assist and ambulated slowly but safely in hallway. Pt's husband and daughter will be with pt 24/7 at home.  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 Strongly encourage pt to allow other family members to care for this daughter while pt gets her strength back.  Feel it would not be safe for pt to be physically caring for her at this time. No further OT or equipment needs at this time.     If plan is discharge home, recommend the following:   Assistance with cooking/housework;Assist for transportation;Help with stairs or ramp for entrance;Supervision due to cognitive status;Other (comment) (assist wtih caring for her bedbound daughter)     Functional Status Assessment   Patient has not had a recent decline in their functional status     Equipment Recommendations   None recommended by OT     Recommendations for Other Services         Precautions/Restrictions   Precautions Precautions: Fall Recall of Precautions/Restrictions: Intact Restrictions Weight Bearing Restrictions Per Provider Order: No     Mobility Bed Mobility Overal bed mobility: Modified Independent             General bed mobility comments: no assist needed    Transfers Overall transfer level: Modified  independent Equipment used: None               General transfer comment: Pt able to transfer and walk without assist. Pt feels slightly weak but no loss of balance.      Balance Overall balance assessment: Mild deficits observed, not formally tested                                         ADL either performed or assessed with clinical judgement   ADL Overall ADL's : At baseline                                       General ADL Comments: Pt is managing all basic adls in room and in hallway. Walked to bathroom with supervision and toileted with supervsion.     Vision Baseline Vision/History: 0 No visual deficits Ability to See in Adequate Light: 0 Adequate Patient Visual Report: No change from baseline Vision Assessment?: No apparent visual deficits     Perception Perception: Within Functional Limits       Praxis Praxis: WFL       Pertinent Vitals/Pain Pain Assessment Pain Assessment: No/denies pain     Extremity/Trunk Assessment Upper Extremity Assessment Upper Extremity Assessment: Overall WFL for tasks assessed   Lower Extremity Assessment Lower Extremity Assessment: Overall [facility]  for tasks assessed   Cervical / Trunk Assessment Cervical / Trunk Assessment: Normal   Communication Communication Communication: No apparent difficulties   Cognition Arousal: Alert Behavior During Therapy: WFL for tasks assessed/performed Cognition: Cognition impaired   Orientation impairments: Time Awareness: Online awareness impaired Memory impairment (select all impairments): Declarative long-term memory, Working memory Attention impairment (select first level of impairment): Alternating attention   OT - Cognition Comments: Pt did not know the year but otherwise oriented. Daughter states this is normal for her.  Pt appears to be at baseline with some memory deficits.                 Following commands: Intact        Cueing  General Comments   Cueing Techniques: Verbal cues  pt very close to baseline. Feels weak but cognition much improved.   Exercises     Shoulder Instructions      Home Living Family/patient expects to be discharged to:: Private residence Living Arrangements: Spouse/significant other;Children Available Help at Discharge: Family;Available 24 hours/day Type of Home: House Home Access: Ramped entrance     Home Layout: Multi-level Alternate Level Stairs-Number of Steps: 14 to basement for laundry only Alternate Level Stairs-Rails: Right Bathroom Shower/Tub: Producer, television/film/video: Standard     Home Equipment: Other (comment) (equipment in home is for her daughter)   Additional Comments: Pt with no personal equipiment.      Prior Functioning/Environment Prior Level of Function : Independent/Modified Independent             Mobility Comments: walks w/o assitive device ADLs Comments: indpendent and cares for daughter who has had major CVA who is bedbound unless she goes to doctor.    OT Problem List: Decreased cognition;Impaired balance (sitting and/or standing)   OT Treatment/Interventions:        OT Goals(Current goals can be found in the care plan section)   Acute Rehab OT Goals Patient Stated Goal: to go  home OT Goal Formulation: All assessment and education complete, DC therapy   OT Frequency:       Co-evaluation              AM-PAC OT "6 Clicks" Daily Activity     Outcome Measure Help from another person eating meals?: None Help from another person taking care of personal grooming?: None Help from another person toileting, which includes using toliet, bedpan, or urinal?: None Help from another person bathing (including washing, rinsing, drying)?: None Help from another person to put on and taking off regular upper body clothing?: None Help from another person to put on and taking off regular lower body clothing?: None 6 Click  Score: 24   End of Session Nurse Communication: Mobility status  Activity Tolerance: Patient limited by fatigue Patient left: in chair;with call bell/phone within reach;with family/visitor present  OT Visit Diagnosis: Unsteadiness on feet (R26.81);Other symptoms and signs involving cognitive function                Time: 5621-3086 OT Time Calculation (min): 21 min Charges:  OT General Charges $OT Visit: 1 Visit OT Evaluation $OT Eval Low Complexity: 1 Low  Hope Budds 08/12/2023, 10:59 AM

## 2023-11-20 ENCOUNTER — Encounter (HOSPITAL_COMMUNITY): Payer: Self-pay

## 2023-11-20 ENCOUNTER — Emergency Department (HOSPITAL_COMMUNITY): Admission: EM | Admit: 2023-11-20 | Discharge: 2023-11-20 | Disposition: A | Discharge status: Home / Self Care

## 2023-11-20 ENCOUNTER — Other Ambulatory Visit: Payer: Self-pay

## 2023-11-20 ENCOUNTER — Emergency Department (HOSPITAL_COMMUNITY)

## 2023-11-20 DIAGNOSIS — N390 Urinary tract infection, site not specified: Secondary | ICD-10-CM | POA: Insufficient documentation

## 2023-11-20 DIAGNOSIS — Z87442 Personal history of urinary calculi: Secondary | ICD-10-CM | POA: Diagnosis not present

## 2023-11-20 DIAGNOSIS — I700 Atherosclerosis of aorta: Secondary | ICD-10-CM | POA: Diagnosis not present

## 2023-11-20 DIAGNOSIS — N132 Hydronephrosis with renal and ureteral calculous obstruction: Secondary | ICD-10-CM | POA: Diagnosis not present

## 2023-11-20 DIAGNOSIS — R5381 Other malaise: Secondary | ICD-10-CM | POA: Diagnosis not present

## 2023-11-20 DIAGNOSIS — R110 Nausea: Secondary | ICD-10-CM | POA: Insufficient documentation

## 2023-11-20 LAB — CBC WITH DIFFERENTIAL/PLATELET
Abs Immature Granulocytes: 0.04 10*3/uL (ref 0.00–0.07)
Basophils Absolute: 0 10*3/uL (ref 0.0–0.1)
Basophils Relative: 0 %
Eosinophils Absolute: 0 10*3/uL (ref 0.0–0.5)
Eosinophils Relative: 0 %
HCT: 40.5 % (ref 36.0–46.0)
Hemoglobin: 13.6 g/dL (ref 12.0–15.0)
Immature Granulocytes: 0 %
Lymphocytes Relative: 14 %
Lymphs Abs: 1.4 10*3/uL (ref 0.7–4.0)
MCH: 31.1 pg (ref 26.0–34.0)
MCHC: 33.6 g/dL (ref 30.0–36.0)
MCV: 92.7 fL (ref 80.0–100.0)
Monocytes Absolute: 1 10*3/uL (ref 0.1–1.0)
Monocytes Relative: 11 %
Neutro Abs: 7.3 10*3/uL (ref 1.7–7.7)
Neutrophils Relative %: 75 %
Platelets: 139 10*3/uL — ABNORMAL LOW (ref 150–400)
RBC: 4.37 MIL/uL (ref 3.87–5.11)
RDW: 13.6 % (ref 11.5–15.5)
WBC: 9.8 10*3/uL (ref 4.0–10.5)
nRBC: 0 % (ref 0.0–0.2)

## 2023-11-20 LAB — BASIC METABOLIC PANEL WITH GFR
Anion gap: 14 (ref 5–15)
BUN: 16 mg/dL (ref 8–23)
CO2: 21 mmol/L — ABNORMAL LOW (ref 22–32)
Calcium: 8.6 mg/dL — ABNORMAL LOW (ref 8.9–10.3)
Chloride: 96 mmol/L — ABNORMAL LOW (ref 98–111)
Creatinine, Ser: 1.13 mg/dL — ABNORMAL HIGH (ref 0.44–1.00)
GFR, Estimated: 49 mL/min — ABNORMAL LOW (ref >60)
Glucose, Bld: 106 mg/dL — ABNORMAL HIGH (ref 70–99)
Potassium: 3.7 mmol/L (ref 3.5–5.1)
Sodium: 131 mmol/L — ABNORMAL LOW (ref 135–145)

## 2023-11-20 LAB — URINALYSIS, W/ REFLEX TO CULTURE (INFECTION SUSPECTED)
Bilirubin Urine: NEGATIVE
Glucose, UA: NEGATIVE mg/dL
Ketones, ur: 20 mg/dL — AB
Nitrite: NEGATIVE
Protein, ur: 100 mg/dL — AB
Specific Gravity, Urine: 1.015 (ref 1.005–1.030)
WBC, UA: >50 WBC/hpf (ref 0–5)
pH: 6 (ref 5.0–8.0)

## 2023-11-20 LAB — LACTIC ACID, PLASMA: Lactic Acid, Venous: 1.3 mmol/L (ref 0.5–1.9)

## 2023-11-20 MED ORDER — ONDANSETRON HCL 4 MG PO TABS: 4.0000 mg | ORAL_TABLET | Freq: Three times a day (TID) | ORAL | 0 refills | Status: AC | PRN

## 2023-11-20 MED ORDER — CEPHALEXIN 500 MG PO CAPS: 500.0000 mg | ORAL_CAPSULE | Freq: Four times a day (QID) | ORAL | 0 refills | Status: AC

## 2023-11-20 MED ORDER — SODIUM CHLORIDE 0.9 % IV BOLUS
500.0000 mL | Freq: Once | INTRAVENOUS | Status: AC
  Administered 2023-11-20: 500 mL via INTRAVENOUS

## 2023-11-20 MED ORDER — ONDANSETRON HCL 4 MG/2ML IJ SOLN
4.0000 mg | Freq: Once | INTRAMUSCULAR | Status: AC
  Administered 2023-11-20: 4 mg via INTRAVENOUS
  Filled 2023-11-20: qty 2

## 2023-11-20 NOTE — Discharge Instructions (Signed)
 Call your urologist today to see if you can still get in for your procedure. If not talk with them about getting in for removal of your stent as soon as possible. Take your antibiotics as prescribed. Take your zofran  as needed for nausea. Drink lots of fluids.

## 2023-11-20 NOTE — ED Triage Notes (Addendum)
 Pt sent by PCP for having high blood pressure reading. Pt stated it was 190 systolic. No hx. Pt being treated for kidney infection as well and was supposed to have surgery for stone this afternoon. Pt stated she's here for infection, husband stated she's here for her blood pressure.

## 2023-11-20 NOTE — ED Provider Notes (Signed)
 [place] EMERGENCY DEPARTMENT AT [facility] Provider Note   CSN: [id number] Arrival date & time: 11/20/23  1220     Patient presents with: Hypertension and Nephrolithiasis   Kari Young is a 83 y.o. female.   83 year old female presents for evaluation of chills and fatigue.  She states this started last night.  She has a history of left-sided kidney stone with a stent in place.  She had an appointment at 1:45 PM today to see urology to have the stent removed and the lithotripsy.  She has been on 2 courses of antibiotics for urinary tract/kidney infection.  She states last night she started feeling bad again and has been very nauseous had increased pain with movement and had chills and generally feeling weak and fatigued.  She states she called the surgeon who was guided to her procedure later today and he told her to come to the ER.  She denies any other symptoms or concerns at this time.   Hypertension Associated symptoms include abdominal pain. Pertinent negatives include no chest pain and no shortness of breath.       Prior to Admission medications   Medication Sig Start Date End Date Taking? Authorizing Provider  cephALEXin  (KEFLEX ) 500 MG capsule Take 1 capsule (500 mg total) by mouth 4 (four) times daily for 7 days. 11/20/23 11/27/23 Yes Hatice Bubel L, DO  ondansetron  (ZOFRAN ) 4 MG tablet Take 1 tablet (4 mg total) by mouth every 8 (eight) hours as needed for up to 4 days for nausea or vomiting. 11/20/23 11/24/23 Yes Jaree Trinka L, DO  amLODipine  (NORVASC ) 10 MG tablet Take 10 mg by mouth daily.    [provider]  ARIPiprazole  (ABILIFY ) 5 MG tablet Take 5 mg by mouth daily. 05/23/19   [provider]  benztropine  (COGENTIN ) 1 MG tablet Take 1 mg by mouth daily. 04/15/22   [provider]  busPIRone  (BUSPAR ) 10 MG tablet Take 1 tablet (10 mg total) by mouth every 12 (twelve) hours. 05/03/22   Justina Oman, MD  cyanocobalamin   (VITAMIN B12) 1000 MCG tablet Take 1 tablet (1,000 mcg total) by mouth in the morning and at bedtime. Patient taking differently: Take 1,000 mcg by mouth daily. 05/03/22   Justina Oman, MD  estradiol (ESTRACE) 0.1 MG/GM vaginal cream Fingertip massage per vagina, nightly for two weeks, then 2 X weekly thereafter 07/28/23   [provider]  furosemide  (LASIX ) 20 MG tablet Take 1 tablet (20 mg total) by mouth daily as needed for fluid or edema. Patient taking differently: Take 20 mg by mouth daily. 05/03/22   Justina Oman, MD  GEMTESA 75 MG TABS Take 75 mg by mouth daily. 07/28/23   [provider]  levocetirizine (XYZAL) 5 MG tablet Take 1 tablet by mouth every evening. 04/09/21   [provider]  magnesium  oxide (MAG-OX) 400 MG tablet Take 400 mg by mouth daily.     [provider]  pantoprazole  (PROTONIX ) 40 MG tablet Take 40 mg by mouth daily. 05/23/19   [provider]  Probiotic Product (RISA-BID PROBIOTIC) TABS Take 1 tablet by mouth twice a day.    [provider]  venlafaxine  (EFFEXOR -XR) 150 MG 24 hr capsule Take 150 mg by mouth daily.    [provider]  zolpidem  (AMBIEN ) 10 MG tablet Take 10 mg by mouth at bedtime as needed for sleep.    [provider]    Allergies: Percocet [oxycodone -acetaminophen ] and Sulfamethoxazole    Review  of Systems  Constitutional:  Positive for chills and fatigue. Negative for fever.  HENT:  Negative for ear pain and sore throat.   Eyes:  Negative for pain and visual disturbance.  Respiratory:  Negative for cough and shortness of breath.   Cardiovascular:  Negative for chest pain and palpitations.  Gastrointestinal:  Positive for abdominal pain and nausea. Negative for vomiting.  Genitourinary:  Negative for dysuria and hematuria.  Musculoskeletal:  Negative for arthralgias and back pain.  Skin:  Negative for color change and rash.  Neurological:  Negative for seizures and  syncope.  All other systems reviewed and are negative.   Updated Vital Signs BP 124/61   Pulse 67   Temp 98.2 F (36.8 C) (Oral)   Resp 18   Ht 5' 3 (1.6 m)   Wt 67.1 kg   SpO2 94%   BMI 26.22 kg/m   Physical Exam Vitals and nursing note reviewed.  Constitutional:      General: She is not in acute distress.    Appearance: Normal appearance. She is well-developed. She is obese. She is not ill-appearing.  HENT:     Head: Normocephalic and atraumatic.   Eyes:     Conjunctiva/sclera: Conjunctivae normal.    Cardiovascular:     Rate and Rhythm: Normal rate and regular rhythm.     Heart sounds: Normal heart sounds. No murmur heard. Pulmonary:     Effort: Pulmonary effort is normal. No respiratory distress.     Breath sounds: Normal breath sounds. No stridor. No wheezing or rhonchi.  Abdominal:     General: There is no distension.     Palpations: Abdomen is soft. There is no mass.     Tenderness: There is no abdominal tenderness.     Hernia: No hernia is present.   Musculoskeletal:        General: No swelling.     Cervical back: Neck supple.   Skin:    General: Skin is warm and dry.     Capillary Refill: Capillary refill takes less than 2 seconds.   Neurological:     General: No focal deficit present.     Mental Status: She is alert.   Psychiatric:        Mood and Affect: Mood normal.     (all labs ordered are listed, but only abnormal results are displayed) Labs Reviewed  URINALYSIS, W/ REFLEX TO CULTURE (INFECTION SUSPECTED) - Abnormal; Notable for the following components:      Result Value   APPearance HAZY (*)    Hgb urine dipstick MODERATE (*)    Ketones, ur 20 (*)    Protein, ur 100 (*)    Leukocytes,Ua LARGE (*)    Bacteria, UA RARE (*)    Non Squamous Epithelial 0-5 (*)    All other components within normal limits  BASIC METABOLIC PANEL WITH GFR - Abnormal; Notable for the following components:   Sodium 131 (*)    Chloride 96 (*)    CO2 21  (*)    Glucose, Bld 106 (*)    Creatinine, Ser 1.13 (*)    Calcium 8.6 (*)    GFR, Estimated 49 (*)    All other components within normal limits  CBC WITH DIFFERENTIAL/PLATELET - Abnormal; Notable for the following components:   Platelets 139 (*)    All other components within normal limits  URINE CULTURE  LACTIC ACID, PLASMA    EKG: None  Radiology: CT Renal Stone Study Result Date: 11/20/2023  CLINICAL DATA:  Abdominal/flank pain, stone suspected Urolithiasis, symptomatic left flank pain, hx of stone ,has stent. EXAM: CT ABDOMEN AND PELVIS WITHOUT CONTRAST TECHNIQUE: Multidetector CT imaging of the abdomen and pelvis was performed following the standard protocol without IV contrast. RADIATION DOSE REDUCTION: This exam was performed according to the departmental dose-optimization program which includes automated exposure control, adjustment of the mA and/or kV according to patient size and/or use of iterative reconstruction technique. COMPARISON:  CT scan abdomen and pelvis from 05/14/2018. FINDINGS: Lower chest: There are dependent changes in the visualized lung bases. No overt consolidation. No pleural effusion. The heart is normal in size. No pericardial effusion. Hepatobiliary: The liver is normal in size. Non-cirrhotic configuration. No suspicious mass. No intrahepatic or extrahepatic bile duct dilation. No calcified gallstones. Normal gallbladder wall thickness. No pericholecystic inflammatory changes. Pancreas: Unremarkable. No pancreatic ductal dilatation or surrounding inflammatory changes. Spleen: Within normal limits. No focal lesion. Adrenals/Urinary Tract: Adrenal glands are unremarkable. No suspicious renal mass within the limitations of this unenhanced exam. No left hydroureteronephrosis or nephroureterolithiasis. There is right ureteric stent. Despite, there is moderate right hydronephrosis and upper hydroureter up to the level of an approximately 3 by 4 x 6 mm Peri ureteric  calculus in the mid ureter. Right ureter distal to the calculus is nondilated. Urinary bladder is under distended, precluding optimal assessment. However, no large mass or stones identified. No perivesical fat stranding. Stomach/Bowel: There is moderate sliding hiatal hernia. There are at least 2 diverticula in the duodenal. No disproportionate dilation of the small or large bowel loops. No evidence of abnormal bowel wall thickening or inflammatory changes. The appendix was not visualized; however there is no acute inflammatory process in the right lower quadrant. Vascular/Lymphatic: No ascites or pneumoperitoneum. No abdominal or pelvic lymphadenopathy, by size criteria. No aneurysmal dilation of the major abdominal arteries. There are mild peripheral atherosclerotic vascular calcifications of the aorta and its major branches. Reproductive: The uterus is surgically absent. No large adnexal mass. Other: There is a tiny fat containing umbilical hernia. The soft tissues and abdominal wall are otherwise unremarkable. Musculoskeletal: No suspicious osseous lesions. There are mild - moderate multilevel degenerative changes in the visualized spine. Redemonstration of T12 superior endplate Schmorl's node. IMPRESSION: 1. There is a 3 x 4 x 6 mm peri-stent calculus in the right mid ureter with resultant moderate right hydronephrosis and upper hydroureter. There is a right ureteric stent in-situ, which is likely not functioning optimally, considering right obstructive uropathy described above. No left hydroureteronephrosis or nephroureterolithiasis. 2. Multiple other nonacute observations, as described above. Aortic Atherosclerosis (ICD10-I70.0). Electronically Signed   By: Beula Brunswick M.D.   On: 11/20/2023 13:59     Procedures   Medications Ordered in the ED  sodium chloride  0.9 % bolus 500 mL (0 mLs Intravenous Stopped 11/20/23 1340)  ondansetron  (ZOFRAN ) injection 4 mg (4 mg Intravenous Given 11/20/23 1258)                                     Medical Decision Making Medical Decision Making Nursing notes are reviewed. Differential diagnosis for this patient would include but not limited to: Kidney stone, UTI, pyelonephritis, lecture abnormality, AKI, other  Records reviewed: Records reviewed and patient is seen by urology at [facility] health on 5/6 and had cystoscopy rescheduled for today  Studies: CT scan independently reviewed by me and shows evidence of stent in appropriate position with  some obstructive uropathy/hydronephrosis and hydroureter  Emergency Department Course:  Vital signs and pulse oximetry are reviewed, evaluated by myself and found to be within normal limits prior to final disposition. Findings of laboratory testing and medical imaging are discussed with patient and family that is available. Patient agrees with the medical care plan as follows:  Patient's lab workup reviewed by me and kidney function is baseline she has some bacteria and leukocytes in her urine.  Will restart her on Keflex  as she has been taking antibiotics for a UTI/kidney infection.  Imaging as above.  Her vitals are stable and she was given fluids and Zofran  as well.  She is feeling much better.  She was post to have a procedure with urology today for stent removal and lithotripsy and advised her to call the office that advised her to come to the ER for evaluation today to reschedule this appointment for as soon as possible.  Advise return to the ER for any worsening symptoms.  She feels comfortable being discharged home.  All results and plan discussed with patient and family at bedside.  Problems Addressed: History of kidney stones: chronic illness or injury with exacerbation, progression, or side effects of treatment Malaise: undiagnosed new problem with uncertain prognosis Nausea: undiagnosed new problem with uncertain prognosis Urinary tract infection without hematuria, site unspecified: acute illness or  injury  Amount and/or Complexity of Data Reviewed External Data Reviewed: notes. Labs: ordered. Decision-making details documented in ED Course. Radiology: ordered and independent interpretation performed. Decision-making details documented in ED Course.  Risk OTC drugs. Prescription drug management. Drug therapy requiring intensive monitoring for toxicity.     Final diagnoses:  History of kidney stones  Nausea  Malaise  Urinary tract infection without hematuria, site unspecified    ED Discharge Orders          Ordered    ondansetron  (ZOFRAN ) 4 MG tablet  Every 8 hours PRN        11/20/23 1408    cephALEXin  (KEFLEX ) 500 MG capsule  4 times daily        11/20/23 1408               Attilio Zeitler L, DO 11/20/23 1436

## 2023-11-22 LAB — URINE CULTURE

## 2023-11-23 ENCOUNTER — Telehealth (HOSPITAL_BASED_OUTPATIENT_CLINIC_OR_DEPARTMENT_OTHER): Payer: Self-pay | Admitting: *Deleted

## 2023-11-23 NOTE — Telephone Encounter (Signed)
 Post ED Visit - Positive Culture Follow-up: Successful Patient Follow-Up  Culture assessed and recommendations reviewed by:  []  [place], Pharm.D. []  Skeet Duke, [address].D., BCPS AQ-ID []  Leslee Rase, Pharm.D., BCPS []  Garland Junk, Pharm.D., BCPS []  [place], [place].D., BCPS, AAHIVP []  Alcide Aly, Pharm.D., BCPS, AAHIVP []  Jerri Morale, PharmD, BCPS []  Graham Laws, PharmD, BCPS []  Cleda Curly, PharmD, BCPS [x]  Sheryle Donning, PharmD  Positive urine culture  []  Patient discharged without antimicrobial prescription and treatment is now indicated [x]  Organism is resistant to prescribed ED discharge antimicrobial []  Patient with positive blood cultures  Changes discussed with ED provider: Dorisann Garre, PA New antibiotic prescription Cipro 500mg  BID x 5 days Called to [facility], [place], [place]  Contacted patient, date 11/23/23, time 1128   Georgine Kitchens 11/23/2023, 11:27 AM

## 2023-11-23 NOTE — Progress Notes (Signed)
 ED Antimicrobial Stewardship Positive Culture Follow Up   Kari Young is an 83 y.o. female who presented to [facility] on 11/20/2023 with a chief complaint of  Chief Complaint  Patient presents with   Hypertension   Nephrolithiasis    Recent Results (from the past 720 hours)  Urine Culture     Status: Abnormal   Collection Time: 11/20/23  1:29 PM   Specimen: Urine, Clean Catch  Result Value Ref Range Status   Specimen Description   Final    URINE, CLEAN CATCH Performed at [facility], [address], [place], [place] [address]    Special Requests   Final    NONE Reflexed from [id number] Performed at [facility], [address], [place], [place] [address]    Culture >=100,000 COLONIES/mL PSEUDOMONAS AERUGINOSA (A)  Final   Report Status 11/22/2023 FINAL  Final   Organism ID, Bacteria PSEUDOMONAS AERUGINOSA (A)  Final      Susceptibility   Pseudomonas aeruginosa - MIC*    CEFTAZIDIME <=1 SENSITIVE Sensitive     CIPROFLOXACIN <=0.25 SENSITIVE Sensitive     GENTAMICIN <=1 SENSITIVE Sensitive     IMIPENEM 2 SENSITIVE Sensitive     PIP/TAZO <=4 SENSITIVE Sensitive ug/mL    CEFEPIME 1 SENSITIVE Sensitive     * >=100,000 COLONIES/mL PSEUDOMONAS AERUGINOSA    [x]  Treated with keflex , organism resistant to prescribed antimicrobial []  Patient discharged originally without antimicrobial agent and treatment is now indicated  Noted to have hx of L kidney stone with stent and planned lithotripsy per urology. S/p cefdinir x 10 days and cefpodoxime x 10 days which do not cover pseudomonas. Recommend switching antibiotics and f/u with urology.   New antibiotic prescription: cipro 500mg  BID x 5days  ED Provider: Dorisann Garre, PA   Fonda Hymen 11/23/2023, 8:53 AM Clinical Pharmacist Monday - Friday phone -  [phone] Saturday - Sunday phone - [phone]
